# Patient Record
Sex: Female | Born: 1945 | Race: Black or African American | Hispanic: No | Marital: Married | Smoking: Never smoker
Health system: Southern US, Community
[De-identification: ages and names within clinical notes are randomized; demographics above are authoritative.]

## PROBLEM LIST (undated history)

## (undated) DIAGNOSIS — E0590 Thyrotoxicosis, unspecified without thyrotoxic crisis or storm: Secondary | ICD-10-CM

## (undated) DIAGNOSIS — R319 Hematuria, unspecified: Secondary | ICD-10-CM

## (undated) DIAGNOSIS — D649 Anemia, unspecified: Secondary | ICD-10-CM

## (undated) DIAGNOSIS — E785 Hyperlipidemia, unspecified: Secondary | ICD-10-CM

## (undated) DIAGNOSIS — D497 Neoplasm of unspecified behavior of endocrine glands and other parts of nervous system: Secondary | ICD-10-CM

## (undated) DIAGNOSIS — R51 Headache: Secondary | ICD-10-CM

## (undated) DIAGNOSIS — E079 Disorder of thyroid, unspecified: Secondary | ICD-10-CM

## (undated) DIAGNOSIS — I10 Essential (primary) hypertension: Secondary | ICD-10-CM

## (undated) DIAGNOSIS — Z8719 Personal history of other diseases of the digestive system: Secondary | ICD-10-CM

## (undated) DIAGNOSIS — Z9221 Personal history of antineoplastic chemotherapy: Secondary | ICD-10-CM

## (undated) DIAGNOSIS — R112 Nausea with vomiting, unspecified: Secondary | ICD-10-CM

## (undated) DIAGNOSIS — C50919 Malignant neoplasm of unspecified site of unspecified female breast: Secondary | ICD-10-CM

## (undated) DIAGNOSIS — K219 Gastro-esophageal reflux disease without esophagitis: Secondary | ICD-10-CM

## (undated) DIAGNOSIS — D219 Benign neoplasm of connective and other soft tissue, unspecified: Secondary | ICD-10-CM

## (undated) DIAGNOSIS — M1990 Unspecified osteoarthritis, unspecified site: Secondary | ICD-10-CM

## (undated) DIAGNOSIS — E669 Obesity, unspecified: Secondary | ICD-10-CM

## (undated) DIAGNOSIS — Z9889 Other specified postprocedural states: Secondary | ICD-10-CM

## (undated) DIAGNOSIS — R0602 Shortness of breath: Secondary | ICD-10-CM

## (undated) DIAGNOSIS — C801 Malignant (primary) neoplasm, unspecified: Secondary | ICD-10-CM

## (undated) DIAGNOSIS — Z923 Personal history of irradiation: Secondary | ICD-10-CM

## (undated) DIAGNOSIS — N189 Chronic kidney disease, unspecified: Secondary | ICD-10-CM

## (undated) HISTORY — DX: Malignant (primary) neoplasm, unspecified: C80.1

## (undated) HISTORY — DX: Disorder of thyroid, unspecified: E07.9

## (undated) HISTORY — PX: WRIST FRACTURE SURGERY: SHX121

## (undated) HISTORY — DX: Benign neoplasm of connective and other soft tissue, unspecified: D21.9

## (undated) HISTORY — DX: Essential (primary) hypertension: I10

## (undated) HISTORY — PX: OOPHORECTOMY: SHX86

## (undated) HISTORY — DX: Obesity, unspecified: E66.9

## (undated) HISTORY — DX: Hyperlipidemia, unspecified: E78.5

## (undated) HISTORY — PX: OTHER SURGICAL HISTORY: SHX169

## (undated) HISTORY — PX: CATARACT EXTRACTION: SUR2

## (undated) HISTORY — DX: Neoplasm of unspecified behavior of endocrine glands and other parts of nervous system: D49.7

## (undated) HISTORY — PX: ABDOMINAL HYSTERECTOMY: SHX81

## (undated) HISTORY — PX: CHOLECYSTECTOMY: SHX55

## (undated) HISTORY — PX: TUBAL LIGATION: SHX77

## (undated) HISTORY — PX: EYE SURGERY: SHX253

## (undated) HISTORY — PX: TOE SURGERY: SHX1073

---

## 1992-10-01 DIAGNOSIS — C801 Malignant (primary) neoplasm, unspecified: Secondary | ICD-10-CM

## 1992-10-01 HISTORY — DX: Malignant (primary) neoplasm, unspecified: C80.1

## 1992-10-01 HISTORY — PX: BREAST LUMPECTOMY: SHX2

## 1998-04-29 ENCOUNTER — Inpatient Hospital Stay (HOSPITAL_COMMUNITY): Admission: RE | Admit: 1998-04-29 | Discharge: 1998-05-03 | Payer: Self-pay | Admitting: Neurosurgery

## 1998-05-04 ENCOUNTER — Inpatient Hospital Stay (HOSPITAL_COMMUNITY): Admission: EM | Admit: 1998-05-04 | Discharge: 1998-05-10 | Payer: Self-pay | Admitting: Emergency Medicine

## 1998-06-07 ENCOUNTER — Encounter: Payer: Self-pay | Admitting: Neurosurgery

## 1998-06-07 ENCOUNTER — Ambulatory Visit (HOSPITAL_COMMUNITY): Admission: RE | Admit: 1998-06-07 | Discharge: 1998-06-07 | Payer: Self-pay | Admitting: Neurosurgery

## 1998-11-11 ENCOUNTER — Encounter: Payer: Self-pay | Admitting: Ophthalmology

## 1998-11-11 ENCOUNTER — Ambulatory Visit (HOSPITAL_COMMUNITY): Admission: RE | Admit: 1998-11-11 | Discharge: 1998-11-11 | Payer: Self-pay | Admitting: Ophthalmology

## 1999-06-06 ENCOUNTER — Encounter: Payer: Self-pay | Admitting: Neurosurgery

## 1999-06-06 ENCOUNTER — Ambulatory Visit (HOSPITAL_COMMUNITY): Admission: RE | Admit: 1999-06-06 | Discharge: 1999-06-06 | Payer: Self-pay | Admitting: Neurosurgery

## 1999-11-17 ENCOUNTER — Encounter: Admission: RE | Admit: 1999-11-17 | Discharge: 1999-11-17 | Payer: Self-pay | Admitting: Endocrinology

## 1999-11-17 ENCOUNTER — Encounter: Payer: Self-pay | Admitting: Endocrinology

## 2001-04-06 ENCOUNTER — Inpatient Hospital Stay (HOSPITAL_COMMUNITY): Admission: EM | Admit: 2001-04-06 | Discharge: 2001-04-07 | Payer: Self-pay | Admitting: Internal Medicine

## 2001-04-06 ENCOUNTER — Encounter: Payer: Self-pay | Admitting: Internal Medicine

## 2001-04-07 ENCOUNTER — Encounter: Payer: Self-pay | Admitting: Family Medicine

## 2001-04-11 ENCOUNTER — Encounter: Payer: Self-pay | Admitting: Neurology

## 2001-04-11 ENCOUNTER — Ambulatory Visit (HOSPITAL_COMMUNITY): Admission: RE | Admit: 2001-04-11 | Discharge: 2001-04-11 | Payer: Self-pay | Admitting: Neurology

## 2002-03-05 ENCOUNTER — Emergency Department (HOSPITAL_COMMUNITY): Admission: EM | Admit: 2002-03-05 | Discharge: 2002-03-05 | Payer: Self-pay | Admitting: *Deleted

## 2002-04-18 ENCOUNTER — Ambulatory Visit (HOSPITAL_COMMUNITY): Admission: RE | Admit: 2002-04-18 | Discharge: 2002-04-18 | Payer: Self-pay | Admitting: Endocrinology

## 2002-04-18 ENCOUNTER — Encounter: Payer: Self-pay | Admitting: Endocrinology

## 2002-06-13 ENCOUNTER — Encounter: Payer: Self-pay | Admitting: *Deleted

## 2002-06-13 ENCOUNTER — Emergency Department (HOSPITAL_COMMUNITY): Admission: EM | Admit: 2002-06-13 | Discharge: 2002-06-13 | Payer: Self-pay | Admitting: *Deleted

## 2002-10-19 ENCOUNTER — Ambulatory Visit (HOSPITAL_COMMUNITY): Admission: RE | Admit: 2002-10-19 | Discharge: 2002-10-19 | Payer: Self-pay | Admitting: Pulmonary Disease

## 2002-10-28 ENCOUNTER — Ambulatory Visit (HOSPITAL_COMMUNITY): Admission: RE | Admit: 2002-10-28 | Discharge: 2002-10-28 | Payer: Self-pay | Admitting: Pulmonary Disease

## 2003-01-22 ENCOUNTER — Other Ambulatory Visit: Admission: RE | Admit: 2003-01-22 | Discharge: 2003-01-22 | Payer: Self-pay | Admitting: Obstetrics and Gynecology

## 2003-05-14 ENCOUNTER — Ambulatory Visit (HOSPITAL_COMMUNITY): Admission: RE | Admit: 2003-05-14 | Discharge: 2003-05-14 | Payer: Self-pay | Admitting: Orthopaedic Surgery

## 2003-05-14 ENCOUNTER — Encounter: Payer: Self-pay | Admitting: Orthopaedic Surgery

## 2003-06-02 ENCOUNTER — Ambulatory Visit (HOSPITAL_COMMUNITY): Admission: RE | Admit: 2003-06-02 | Discharge: 2003-06-02 | Payer: Self-pay | Admitting: Pulmonary Disease

## 2003-06-10 ENCOUNTER — Ambulatory Visit (HOSPITAL_COMMUNITY): Admission: RE | Admit: 2003-06-10 | Discharge: 2003-06-10 | Payer: Self-pay | Admitting: Pulmonary Disease

## 2003-06-18 ENCOUNTER — Ambulatory Visit (HOSPITAL_COMMUNITY): Admission: RE | Admit: 2003-06-18 | Discharge: 2003-06-18 | Payer: Self-pay | Admitting: Pulmonary Disease

## 2003-08-08 ENCOUNTER — Emergency Department (HOSPITAL_COMMUNITY): Admission: EM | Admit: 2003-08-08 | Discharge: 2003-08-08 | Payer: Self-pay | Admitting: Emergency Medicine

## 2004-01-09 ENCOUNTER — Emergency Department (HOSPITAL_COMMUNITY): Admission: EM | Admit: 2004-01-09 | Discharge: 2004-01-09 | Payer: Self-pay | Admitting: Emergency Medicine

## 2004-01-24 ENCOUNTER — Other Ambulatory Visit: Admission: RE | Admit: 2004-01-24 | Discharge: 2004-01-24 | Payer: Self-pay | Admitting: Obstetrics and Gynecology

## 2004-02-24 ENCOUNTER — Ambulatory Visit (HOSPITAL_COMMUNITY): Admission: RE | Admit: 2004-02-24 | Discharge: 2004-02-24 | Payer: Self-pay | Admitting: Gastroenterology

## 2004-10-09 ENCOUNTER — Encounter: Admission: RE | Admit: 2004-10-09 | Discharge: 2004-10-09 | Payer: Self-pay | Admitting: Gastroenterology

## 2004-11-22 ENCOUNTER — Ambulatory Visit: Payer: Self-pay | Admitting: Orthopedic Surgery

## 2004-11-28 ENCOUNTER — Ambulatory Visit (HOSPITAL_COMMUNITY): Admission: RE | Admit: 2004-11-28 | Discharge: 2004-11-28 | Payer: Self-pay | Admitting: Orthopedic Surgery

## 2004-11-30 ENCOUNTER — Ambulatory Visit: Payer: Self-pay | Admitting: Orthopedic Surgery

## 2004-12-22 ENCOUNTER — Emergency Department (HOSPITAL_COMMUNITY): Admission: EM | Admit: 2004-12-22 | Discharge: 2004-12-22 | Payer: Self-pay | Admitting: Emergency Medicine

## 2005-03-01 ENCOUNTER — Other Ambulatory Visit: Admission: RE | Admit: 2005-03-01 | Discharge: 2005-03-01 | Payer: Self-pay | Admitting: Addiction Medicine

## 2005-03-06 ENCOUNTER — Ambulatory Visit: Payer: Self-pay | Admitting: Endocrinology

## 2005-04-24 ENCOUNTER — Ambulatory Visit (HOSPITAL_COMMUNITY): Admission: RE | Admit: 2005-04-24 | Discharge: 2005-04-24 | Payer: Self-pay | Admitting: Pulmonary Disease

## 2005-10-05 ENCOUNTER — Emergency Department (HOSPITAL_COMMUNITY): Admission: EM | Admit: 2005-10-05 | Discharge: 2005-10-05 | Payer: Self-pay | Admitting: Emergency Medicine

## 2005-10-29 ENCOUNTER — Ambulatory Visit: Payer: Self-pay | Admitting: Orthopedic Surgery

## 2006-04-26 ENCOUNTER — Ambulatory Visit (HOSPITAL_COMMUNITY): Admission: RE | Admit: 2006-04-26 | Discharge: 2006-04-26 | Payer: Self-pay | Admitting: Obstetrics and Gynecology

## 2006-05-03 ENCOUNTER — Other Ambulatory Visit: Admission: RE | Admit: 2006-05-03 | Discharge: 2006-05-03 | Payer: Self-pay | Admitting: Obstetrics and Gynecology

## 2006-05-10 ENCOUNTER — Emergency Department (HOSPITAL_COMMUNITY): Admission: EM | Admit: 2006-05-10 | Discharge: 2006-05-10 | Payer: Self-pay | Admitting: Emergency Medicine

## 2007-05-01 ENCOUNTER — Ambulatory Visit (HOSPITAL_COMMUNITY): Admission: RE | Admit: 2007-05-01 | Discharge: 2007-05-01 | Payer: Self-pay | Admitting: Obstetrics and Gynecology

## 2007-05-07 ENCOUNTER — Other Ambulatory Visit: Admission: RE | Admit: 2007-05-07 | Discharge: 2007-05-07 | Payer: Self-pay | Admitting: Obstetrics and Gynecology

## 2007-09-08 ENCOUNTER — Encounter: Payer: Self-pay | Admitting: Internal Medicine

## 2008-05-03 ENCOUNTER — Encounter: Admission: RE | Admit: 2008-05-03 | Discharge: 2008-05-03 | Payer: Self-pay | Admitting: Obstetrics and Gynecology

## 2009-05-04 ENCOUNTER — Encounter: Admission: RE | Admit: 2009-05-04 | Discharge: 2009-05-04 | Payer: Self-pay | Admitting: Obstetrics and Gynecology

## 2009-05-19 ENCOUNTER — Other Ambulatory Visit: Admission: RE | Admit: 2009-05-19 | Discharge: 2009-05-19 | Payer: Self-pay | Admitting: Obstetrics and Gynecology

## 2009-05-19 ENCOUNTER — Encounter: Payer: Self-pay | Admitting: Obstetrics and Gynecology

## 2009-05-19 ENCOUNTER — Ambulatory Visit: Payer: Self-pay | Admitting: Obstetrics and Gynecology

## 2009-05-20 ENCOUNTER — Ambulatory Visit: Payer: Self-pay | Admitting: Cardiology

## 2009-05-20 ENCOUNTER — Inpatient Hospital Stay (HOSPITAL_COMMUNITY): Admission: EM | Admit: 2009-05-20 | Discharge: 2009-05-24 | Payer: Self-pay | Admitting: Emergency Medicine

## 2009-05-23 ENCOUNTER — Encounter: Payer: Self-pay | Admitting: Cardiology

## 2009-05-23 ENCOUNTER — Encounter (INDEPENDENT_AMBULATORY_CARE_PROVIDER_SITE_OTHER): Payer: Self-pay | Admitting: Internal Medicine

## 2009-07-19 ENCOUNTER — Ambulatory Visit: Payer: Self-pay | Admitting: Obstetrics and Gynecology

## 2009-10-02 ENCOUNTER — Emergency Department (HOSPITAL_COMMUNITY): Admission: EM | Admit: 2009-10-02 | Discharge: 2009-10-02 | Payer: Self-pay | Admitting: Emergency Medicine

## 2010-03-25 ENCOUNTER — Emergency Department (HOSPITAL_COMMUNITY): Admission: EM | Admit: 2010-03-25 | Discharge: 2010-03-26 | Payer: Self-pay | Admitting: Emergency Medicine

## 2010-05-23 ENCOUNTER — Other Ambulatory Visit: Admission: RE | Admit: 2010-05-23 | Discharge: 2010-05-23 | Payer: Self-pay | Admitting: Obstetrics and Gynecology

## 2010-05-23 ENCOUNTER — Ambulatory Visit: Payer: Self-pay | Admitting: Obstetrics and Gynecology

## 2010-07-19 ENCOUNTER — Emergency Department (HOSPITAL_COMMUNITY): Admission: EM | Admit: 2010-07-19 | Discharge: 2010-07-19 | Payer: Self-pay | Admitting: Emergency Medicine

## 2010-08-11 ENCOUNTER — Emergency Department (HOSPITAL_COMMUNITY): Admission: EM | Admit: 2010-08-11 | Discharge: 2010-08-11 | Payer: Self-pay | Admitting: Emergency Medicine

## 2010-10-22 ENCOUNTER — Encounter: Payer: Self-pay | Admitting: Obstetrics and Gynecology

## 2010-12-12 LAB — CBC
HCT: 35.4 % — ABNORMAL LOW (ref 36.0–46.0)
Hemoglobin: 11.9 g/dL — ABNORMAL LOW (ref 12.0–15.0)
RBC: 3.95 MIL/uL (ref 3.87–5.11)
RDW: 13.7 % (ref 11.5–15.5)
WBC: 5.3 10*3/uL (ref 4.0–10.5)

## 2010-12-12 LAB — BASIC METABOLIC PANEL
BUN: 13 mg/dL (ref 6–23)
GFR calc Af Amer: >60 mL/min (ref >60)
GFR calc non Af Amer: 50 mL/min — ABNORMAL LOW (ref >60)
Potassium: 3.6 mEq/L (ref 3.5–5.1)
Sodium: 133 mEq/L — ABNORMAL LOW (ref 135–145)

## 2010-12-12 LAB — DIFFERENTIAL
Basophils Absolute: 0 10*3/uL (ref 0.0–0.1)
Lymphocytes Relative: 18 % (ref 12–46)
Monocytes Absolute: 0.4 10*3/uL (ref 0.1–1.0)
Neutro Abs: 3.9 10*3/uL (ref 1.7–7.7)

## 2010-12-12 LAB — GLUCOSE, CAPILLARY: Glucose-Capillary: 134 mg/dL — ABNORMAL HIGH (ref 70–99)

## 2010-12-13 LAB — COMPREHENSIVE METABOLIC PANEL
AST: 22 U/L (ref 0–37)
Albumin: 3.9 g/dL (ref 3.5–5.2)
Calcium: 9.1 mg/dL (ref 8.4–10.5)
Creatinine, Ser: 1.15 mg/dL (ref 0.4–1.2)
GFR calc Af Amer: 57 mL/min — ABNORMAL LOW (ref >60)

## 2010-12-13 LAB — URINALYSIS, ROUTINE W REFLEX MICROSCOPIC
Nitrite: NEGATIVE
Specific Gravity, Urine: 1.025 (ref 1.005–1.030)
pH: 5.5 (ref 5.0–8.0)

## 2010-12-13 LAB — CBC
MCH: 30.8 pg (ref 26.0–34.0)
MCHC: 34.2 g/dL (ref 30.0–36.0)
Platelets: 173 10*3/uL (ref 150–400)
RDW: 13.8 % (ref 11.5–15.5)

## 2010-12-13 LAB — DIFFERENTIAL
Eosinophils Relative: 1 % (ref 0–5)
Lymphocytes Relative: 30 % (ref 12–46)
Lymphs Abs: 1.1 10*3/uL (ref 0.7–4.0)
Monocytes Absolute: 0.5 10*3/uL (ref 0.1–1.0)

## 2010-12-13 LAB — URINE MICROSCOPIC-ADD ON

## 2010-12-13 LAB — LIPASE, BLOOD: Lipase: 28 U/L (ref 11–59)

## 2010-12-16 LAB — DIFFERENTIAL
Basophils Absolute: 0 10*3/uL (ref 0.0–0.1)
Basophils Relative: 0 % (ref 0–1)
Lymphocytes Relative: 19 % (ref 12–46)
Monocytes Relative: 12 % (ref 3–12)
Neutro Abs: 2.4 10*3/uL (ref 1.7–7.7)
Neutrophils Relative %: 68 % (ref 43–77)

## 2010-12-16 LAB — BASIC METABOLIC PANEL
Calcium: 8.5 mg/dL (ref 8.4–10.5)
Chloride: 102 mEq/L (ref 96–112)
Creatinine, Ser: 1.03 mg/dL (ref 0.4–1.2)
GFR calc non Af Amer: 54 mL/min — ABNORMAL LOW (ref >60)
Potassium: 4 mEq/L (ref 3.5–5.1)

## 2010-12-16 LAB — CBC
MCHC: 33.7 g/dL (ref 30.0–36.0)
RBC: 3.98 MIL/uL (ref 3.87–5.11)
RDW: 13.6 % (ref 11.5–15.5)

## 2010-12-17 LAB — CBC
HCT: 35 % — ABNORMAL LOW (ref 36.0–46.0)
Hemoglobin: 12 g/dL (ref 12.0–15.0)
MCH: 30.6 pg (ref 26.0–34.0)
MCV: 89.1 fL (ref 78.0–100.0)
RBC: 3.93 MIL/uL (ref 3.87–5.11)

## 2010-12-17 LAB — BASIC METABOLIC PANEL
CO2: 29 mEq/L (ref 19–32)
Calcium: 9.4 mg/dL (ref 8.4–10.5)
Chloride: 98 mEq/L (ref 96–112)
GFR calc Af Amer: >60 mL/min (ref >60)
Potassium: 3.9 mEq/L (ref 3.5–5.1)
Sodium: 135 mEq/L (ref 135–145)

## 2010-12-17 LAB — URINALYSIS, ROUTINE W REFLEX MICROSCOPIC
Leukocytes, UA: NEGATIVE
Nitrite: NEGATIVE
Protein, ur: NEGATIVE mg/dL
Specific Gravity, Urine: <1.005 — ABNORMAL LOW (ref 1.005–1.030)
Urobilinogen, UA: 0.2 mg/dL (ref 0.0–1.0)

## 2010-12-17 LAB — DIFFERENTIAL
Eosinophils Absolute: 0 10*3/uL (ref 0.0–0.7)
Lymphs Abs: 1.2 10*3/uL (ref 0.7–4.0)
Monocytes Relative: 14 % — ABNORMAL HIGH (ref 3–12)
Neutrophils Relative %: 56 % (ref 43–77)

## 2010-12-17 LAB — URINE MICROSCOPIC-ADD ON

## 2011-01-01 ENCOUNTER — Encounter: Payer: Self-pay | Admitting: Cardiovascular Disease

## 2011-01-06 LAB — COMPREHENSIVE METABOLIC PANEL
ALT: 28 U/L (ref 0–35)
AST: 38 U/L — ABNORMAL HIGH (ref 0–37)
Albumin: 3.1 g/dL — ABNORMAL LOW (ref 3.5–5.2)
BUN: 11 mg/dL (ref 6–23)
CO2: 23 mEq/L (ref 19–32)
CO2: 25 mEq/L (ref 19–32)
Calcium: 7.4 mg/dL — ABNORMAL LOW (ref 8.4–10.5)
Chloride: 103 mEq/L (ref 96–112)
Creatinine, Ser: 0.9 mg/dL (ref 0.4–1.2)
Creatinine, Ser: 1.19 mg/dL (ref 0.4–1.2)
GFR calc Af Amer: 55 mL/min — ABNORMAL LOW (ref >60)
GFR calc non Af Amer: 46 mL/min — ABNORMAL LOW (ref >60)
GFR calc non Af Amer: >60 mL/min (ref >60)
Glucose, Bld: 178 mg/dL — ABNORMAL HIGH (ref 70–99)
Sodium: 140 mEq/L (ref 135–145)
Total Bilirubin: 2 mg/dL — ABNORMAL HIGH (ref 0.3–1.2)
Total Protein: 5.9 g/dL — ABNORMAL LOW (ref 6.0–8.3)

## 2011-01-06 LAB — DIFFERENTIAL
Basophils Absolute: 0 10*3/uL (ref 0.0–0.1)
Basophils Absolute: 0 10*3/uL (ref 0.0–0.1)
Basophils Absolute: 0 10*3/uL (ref 0.0–0.1)
Basophils Absolute: 0 10*3/uL (ref 0.0–0.1)
Basophils Relative: 0 % (ref 0–1)
Basophils Relative: 0 % (ref 0–1)
Eosinophils Absolute: 0 10*3/uL (ref 0.0–0.7)
Eosinophils Absolute: 0 10*3/uL (ref 0.0–0.7)
Eosinophils Relative: 0 % (ref 0–5)
Lymphocytes Relative: 11 % — ABNORMAL LOW (ref 12–46)
Lymphocytes Relative: 17 % (ref 12–46)
Lymphocytes Relative: 7 % — ABNORMAL LOW (ref 12–46)
Lymphocytes Relative: 9 % — ABNORMAL LOW (ref 12–46)
Monocytes Absolute: 0.1 10*3/uL (ref 0.1–1.0)
Monocytes Absolute: 0.3 10*3/uL (ref 0.1–1.0)
Monocytes Absolute: 0.4 10*3/uL (ref 0.1–1.0)
Monocytes Relative: 5 % (ref 3–12)
Monocytes Relative: 7 % (ref 3–12)
Neutro Abs: 4.7 10*3/uL (ref 1.7–7.7)
Neutro Abs: 5.3 10*3/uL (ref 1.7–7.7)
Neutro Abs: 7.4 10*3/uL (ref 1.7–7.7)
Neutrophils Relative %: 77 % (ref 43–77)
Neutrophils Relative %: 83 % — ABNORMAL HIGH (ref 43–77)
Neutrophils Relative %: 86 % — ABNORMAL HIGH (ref 43–77)

## 2011-01-06 LAB — URINALYSIS, ROUTINE W REFLEX MICROSCOPIC
Nitrite: NEGATIVE
Specific Gravity, Urine: 1.01 (ref 1.005–1.030)
Urobilinogen, UA: 2 mg/dL — ABNORMAL HIGH (ref 0.0–1.0)

## 2011-01-06 LAB — GLUCOSE, CAPILLARY
Glucose-Capillary: 109 mg/dL — ABNORMAL HIGH (ref 70–99)
Glucose-Capillary: 145 mg/dL — ABNORMAL HIGH (ref 70–99)
Glucose-Capillary: 172 mg/dL — ABNORMAL HIGH (ref 70–99)
Glucose-Capillary: 178 mg/dL — ABNORMAL HIGH (ref 70–99)
Glucose-Capillary: 180 mg/dL — ABNORMAL HIGH (ref 70–99)
Glucose-Capillary: 180 mg/dL — ABNORMAL HIGH (ref 70–99)
Glucose-Capillary: 194 mg/dL — ABNORMAL HIGH (ref 70–99)
Glucose-Capillary: 201 mg/dL — ABNORMAL HIGH (ref 70–99)
Glucose-Capillary: 97 mg/dL (ref 70–99)

## 2011-01-06 LAB — IRON AND TIBC
Iron: 132 ug/dL (ref 42–135)
Saturation Ratios: 48 % (ref 20–55)
TIBC: 277 ug/dL (ref 250–470)
UIBC: 145 ug/dL

## 2011-01-06 LAB — CBC
HCT: 26.8 % — ABNORMAL LOW (ref 36.0–46.0)
HCT: 27.6 % — ABNORMAL LOW (ref 36.0–46.0)
Hemoglobin: 9 g/dL — ABNORMAL LOW (ref 12.0–15.0)
Hemoglobin: 9.1 g/dL — ABNORMAL LOW (ref 12.0–15.0)
Hemoglobin: 9.7 g/dL — ABNORMAL LOW (ref 12.0–15.0)
MCHC: 34.5 g/dL (ref 30.0–36.0)
MCHC: 34.8 g/dL (ref 30.0–36.0)
MCV: 90.3 fL (ref 78.0–100.0)
MCV: 90.6 fL (ref 78.0–100.0)
MCV: 90.7 fL (ref 78.0–100.0)
MCV: 90.7 fL (ref 78.0–100.0)
Platelets: 126 10*3/uL — ABNORMAL LOW (ref 150–400)
Platelets: 145 10*3/uL — ABNORMAL LOW (ref 150–400)
Platelets: 153 10*3/uL (ref 150–400)
Platelets: 171 10*3/uL (ref 150–400)
RBC: 2.68 MIL/uL — ABNORMAL LOW (ref 3.87–5.11)
RBC: 2.92 MIL/uL — ABNORMAL LOW (ref 3.87–5.11)
RDW: 13.2 % (ref 11.5–15.5)
RDW: 13.4 % (ref 11.5–15.5)
RDW: 13.4 % (ref 11.5–15.5)
WBC: 5.7 10*3/uL (ref 4.0–10.5)
WBC: 5.9 10*3/uL (ref 4.0–10.5)

## 2011-01-06 LAB — BASIC METABOLIC PANEL
BUN: 11 mg/dL (ref 6–23)
BUN: 20 mg/dL (ref 6–23)
Calcium: 7.8 mg/dL — ABNORMAL LOW (ref 8.4–10.5)
Calcium: 8.9 mg/dL (ref 8.4–10.5)
Chloride: 103 mEq/L (ref 96–112)
Creatinine, Ser: 1.61 mg/dL — ABNORMAL HIGH (ref 0.4–1.2)
GFR calc Af Amer: >60 mL/min (ref >60)
GFR calc non Af Amer: 32 mL/min — ABNORMAL LOW (ref >60)
GFR calc non Af Amer: 57 mL/min — ABNORMAL LOW (ref >60)
Glucose, Bld: 115 mg/dL — ABNORMAL HIGH (ref 70–99)
Glucose, Bld: 178 mg/dL — ABNORMAL HIGH (ref 70–99)
Potassium: 2.9 mEq/L — ABNORMAL LOW (ref 3.5–5.1)
Sodium: 138 mEq/L (ref 135–145)
Sodium: 138 mEq/L (ref 135–145)
Sodium: 140 mEq/L (ref 135–145)

## 2011-01-06 LAB — CARDIAC PANEL(CRET KIN+CKTOT+MB+TROPI)
CK, MB: 2 ng/mL (ref 0.3–4.0)
CK, MB: 2.5 ng/mL (ref 0.3–4.0)
CK, MB: 2.6 ng/mL (ref 0.3–4.0)
Relative Index: 1.1 (ref 0.0–2.5)
Relative Index: 1.2 (ref 0.0–2.5)
Total CK: 188 U/L — ABNORMAL HIGH (ref 7–177)
Total CK: 233 U/L — ABNORMAL HIGH (ref 7–177)
Troponin I: 0.08 ng/mL — ABNORMAL HIGH (ref 0.00–0.06)

## 2011-01-06 LAB — D-DIMER, QUANTITATIVE: D-Dimer, Quant: 1.72 ug/mL-FEU — ABNORMAL HIGH (ref 0.00–0.48)

## 2011-01-06 LAB — URINE MICROSCOPIC-ADD ON

## 2011-01-06 LAB — PROTIME-INR
INR: 1.4 (ref 0.00–1.49)
Prothrombin Time: 17.3 seconds — ABNORMAL HIGH (ref 11.6–15.2)

## 2011-01-06 LAB — CULTURE, BLOOD (ROUTINE X 2)
Culture: NO GROWTH
Report Status: 8252010
Report Status: 8252010

## 2011-01-06 LAB — BLOOD GAS, ARTERIAL
Bicarbonate: 22.3 mEq/L (ref 20.0–24.0)
O2 Content: 2 L/min
Patient temperature: 37
pH, Arterial: 7.372 (ref 7.350–7.400)
pO2, Arterial: 144 mmHg — ABNORMAL HIGH (ref 80.0–100.0)

## 2011-01-06 LAB — URINE CULTURE

## 2011-01-06 LAB — AMYLASE: Amylase: 72 U/L (ref 27–131)

## 2011-01-06 LAB — TYPE AND SCREEN
ABO/RH(D): B POS
Antibody Screen: NEGATIVE

## 2011-01-06 LAB — HEPATIC FUNCTION PANEL
ALT: 35 U/L (ref 0–35)
AST: 59 U/L — ABNORMAL HIGH (ref 0–37)
Total Protein: 7.6 g/dL (ref 6.0–8.3)

## 2011-01-06 LAB — LIPASE, BLOOD: Lipase: 22 U/L (ref 11–59)

## 2011-01-18 ENCOUNTER — Encounter: Payer: Self-pay | Admitting: *Deleted

## 2011-01-19 ENCOUNTER — Encounter: Payer: Self-pay | Admitting: *Deleted

## 2011-01-19 ENCOUNTER — Ambulatory Visit (INDEPENDENT_AMBULATORY_CARE_PROVIDER_SITE_OTHER): Payer: Medicare HMO | Admitting: Cardiovascular Disease

## 2011-01-19 ENCOUNTER — Encounter: Payer: Self-pay | Admitting: Cardiovascular Disease

## 2011-01-19 DIAGNOSIS — R011 Cardiac murmur, unspecified: Secondary | ICD-10-CM

## 2011-01-19 DIAGNOSIS — I10 Essential (primary) hypertension: Secondary | ICD-10-CM | POA: Insufficient documentation

## 2011-01-19 DIAGNOSIS — E785 Hyperlipidemia, unspecified: Secondary | ICD-10-CM | POA: Insufficient documentation

## 2011-01-19 DIAGNOSIS — R072 Precordial pain: Secondary | ICD-10-CM

## 2011-01-19 DIAGNOSIS — R079 Chest pain, unspecified: Secondary | ICD-10-CM | POA: Insufficient documentation

## 2011-01-19 DIAGNOSIS — R010 Benign and innocent cardiac murmurs: Secondary | ICD-10-CM

## 2011-01-19 NOTE — Patient Instructions (Signed)
Your physician has requested that you have en exercise stress cardiolite. For further information please visit [email or web address]. Please follow instruction sheet, as given. Your physician recommends that you continue on your current medications as directed. Please refer to the Current Medication list given to you today.  Follow up will be based on test results. If the results of your test are normal or stable, you will receive a letter. If they are abnormal, the nurse will contact you by phone.

## 2011-01-19 NOTE — Progress Notes (Signed)
HPI  Ms. Beauchaine is a 65 y/o female with no previous cardiac history. She has risk factors for CAD that include type 2 diabetes, hypertension and hyperlipidemia. She is referred for evaluation of recent chest pain. This started on 04/01 shortly after she woke up. It was in lower chest and upper epigastric area. It was mild and described as aching. It lasted all day and did not get worse with activities. There was no other associated symptoms. There was no frank heartburn. She had few milder episodes since then but not in last week. Dr. Margo Aye started her on Omeprazole with significant improvement in her symptoms. She did have similar chest pain in 2009. A stress test was unremarkable at that time. She is reasonably active and exercises at low-moderate level on a regular basis. She denies any dyspnea, palpitations, syncope or presyncope.   Allergies  Allergen Reactions  . Ciprofloxacin Hcl   . Codeine Nausea And Vomiting     Current Outpatient Prescriptions on File Prior to Visit  Medication Sig Dispense Refill  . bimatoprost (LUMIGAN) 0.03 % ophthalmic drops Place 1 drop into both eyes at bedtime.        . brimonidine (ALPHAGAN P) 0.1 % SOLN Place 1 drop into both eyes 2 (two) times daily.       . dorzolamide-timolol (COSOPT) 22.3-6.8 MG/ML ophthalmic solution Place 1 drop into both eyes 2 (two) times daily.        . hydrocortisone (CORTEF) 20 MG tablet Take 20 mg by mouth daily. And 1/2 by  Mouth in eveing      . levothyroxine (SYNTHROID, LEVOTHROID) 112 MCG tablet Take 112 mcg by mouth daily.        . metFORMIN (GLUMETZA) 1000 MG (MOD) 24 hr tablet Take 1,000 mg by mouth daily with breakfast.        . pioglitazone (ACTOS) 45 MG tablet Take 45 mg by mouth daily.        Marland Kitchen DISCONTD: irbesartan (AVAPRO) 150 MG tablet Take 150 mg by mouth at bedtime.        Marland Kitchen DISCONTD: triamterene-hydrochlorothiazide (MAXZIDE-25) 37.5-25 MG per tablet Take 1 tablet by mouth daily.           Past Medical History    Diagnosis Date  . Diabetes mellitus   . Glaucoma   . Obesity   . Cancer 1994    left breast  . Thyroid disease     overactive thyroid  . Hypertension   . Hyperlipidemia      Past Surgical History  Procedure Date  . Breast lumpectomy     with removal of lymph nodes/had chemo and radiation  . 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 Cholecystectomy   . Abdominal hysterectomy   . Tubal ligation      Family History  Problem Relation Age of Onset  . Coronary artery disease Other     family h/o  . Hypertension Other     family h/o  . Diabetes Other     family h/o  . Breast cancer Mother   . Heart failure Father      History   Social History  . Marital Status: Married    Spouse Name: N/A    Number of Children: N/A  . Years of Education: N/A   Occupational History  . Not on file.   Social History Main Topics  . Smoking status: Never Smoker   . Smokeless tobacco: Not on file  . Alcohol Use: No  .  Drug Use: No  . Sexually Active: Not on file   Other Topics Concern  . Not on file   Social History Narrative   Married to [place] one son Dayton Martes to work in Designer, fashion/clothing     ROS Constitutional: Negative for fever, chills, diaphoresis, activity change, appetite change and fatigue.  HENT: Negative for hearing loss, nosebleeds, congestion, sore throat, facial swelling, drooling, trouble swallowing, neck pain, voice change, sinus pressure and tinnitus.  Eyes: Negative for photophobia, pain, discharge and visual disturbance.  Respiratory: Negative for apnea, cough, chest tightness, shortness of breath and wheezing.  Cardiovascular: Negative for palpitations and leg swelling.  Gastrointestinal: Negative for nausea, vomiting, abdominal pain, diarrhea, constipation, blood in stool and abdominal distention.  Genitourinary: Negative for dysuria, urgency, frequency, hematuria and decreased urine volume.  Musculoskeletal: Negative for myalgias, back pain, joint swelling,  arthralgias and gait problem.  Skin: Negative for color change, pallor, rash and wound.  Neurological: Negative for dizziness, tremors, seizures, syncope, speech difficulty, weakness, light-headedness, numbness and headaches.  Psychiatric/Behavioral: Negative for suicidal ideas, hallucinations, behavioral problems and agitation. The patient is not nervous/anxious.     PHYSICAL EXAM   BP 133/83  Pulse 75  Ht 5\' 4"  (1.626 m)  Wt 188 lb (85.276 kg)  BMI 32.27 kg/m2 Constitutional: She is oriented to person, place, and time. She appears well-developed and well-nourished. No distress.  HENT: No nasal discharge.  Head: Normocephalic and atraumatic.  Eyes: Pupils are equal, round, and reactive to light. Right eye exhibits no discharge. Left eye exhibits no discharge.  Neck: Normal range of motion. Neck supple. No JVD present. No thyromegaly present.  Cardiovascular: Normal rate, regular rhythm, normal heart sounds and intact distal pulses. Exam reveals no gallop and no friction rub.  There is a 1/6 systolic ejection murmur heard in aortic area with preserved S2.  Pulmonary/Chest: Effort normal and breath sounds normal. No stridor. No respiratory distress. She has no wheezes. She has no rales. She exhibits no tenderness.  Abdominal: Soft. Bowel sounds are normal. She exhibits no distension. There is no tenderness. There is no rebound and no guarding.  Musculoskeletal: Normal range of motion. She exhibits no edema and no tenderness.  Neurological: She is alert and oriented to person, place, and time. Coordination normal.  Skin: Skin is warm and dry. No rash noted. She is not diaphoretic. No erythema. No pallor.  Psychiatric: She has a normal mood and affect. Her behavior is normal. Judgment and thought content normal.     EKG: Normal sinus rhythm. 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

## 2011-01-19 NOTE — Assessment & Plan Note (Signed)
Her chest pain is overall atypical and could have been GI related. However, her ECG is slightly abnormal. An isolated Q wave in lead III is not pathologic by itself. However, there is a small Q wave in aAVF as well. She also has multiple risk factors for CAD that include diabetes, hypertension, hyperlipidemia and age. Thus, I recommend further evaluation with a treadmill nuclear stress test. If this comes back normal, then continue aggressive treatment of her risk factors.

## 2011-01-19 NOTE — Assessment & Plan Note (Signed)
BP is well controlled 

## 2011-01-19 NOTE — Assessment & Plan Note (Signed)
Being treated with Simvastatin. Goal LDL <100 due to diabetes.

## 2011-01-19 NOTE — Assessment & Plan Note (Signed)
This seems to be a benign flow murmur.

## 2011-01-23 DIAGNOSIS — R072 Precordial pain: Secondary | ICD-10-CM

## 2011-02-13 NOTE — H&P (Signed)
NAME:  Alexis Singh, Alexis Singh NO.:  [id number]   MEDICAL RECORD NO.:  [id number]          PATIENT TYPE:  INP   LOCATION:  IC03                          FACILITY:  [facility]   PHYSICIAN:  Melissa L. Ladona Ridgel, MD  DATE OF BIRTH:  04-21-1946   DATE OF ADMISSION:  05/20/2009  DATE OF DISCHARGE:  LH                              HISTORY & PHYSICAL   CHIEF COMPLAINT:  I was so cold and I had increased urination and  vomiting.   PRIMARY CARE PHYSICIAN:  Alfonse Alpers. Dagoberto Ligas, M.D.   HISTORY OF PRESENT ILLNESS:  The patient is a 65 year old African  American female who had the acute onset of chills on the evening prior  to admission.  Up until that time she was in her usual state of health.  She states that later in the evening she developed nausea and vomiting.  She had no diarrhea.  She stated that she continued to have nausea and  vomiting persisting into the morning.  She came to the emergency room  for further evaluation and was noted to be quite ill-appearing.  She had  a CT scan of the abdomen which showed liver cysts that have increased in  size and quantity and appear very complex suggesting either hemorrhagic  transformation or other pathology.  She definitely had evidence for UTI  and was started on Rocephin 1 mg.  The patient subsequently became  hypotensive consistent with impending vascular collapse secondary to  sepsis and likely adrenal insufficiency.  The patient is hydrocortisone  dependent secondary to a history of pituitary tumor removed in 1995 and  1999.   The patient was immediately treated upon my arrival with hydrocortisone  100 mg IV and volume repletion was continued.  A central line was placed  by the emergency room physician and we continued to volume replete this  patient and transfer her to the ICU for admission.  Further antibiotic  coverage was added in the emergency room for sepsis and a sepsis  protocol was initiated.   REVIEW OF SYSTEMS:   CONSTITUTIONAL:  The patient describes no weight  loss or weight gain.  She has had no fever but did have chills.  HEENT:  She does have some changes in her vision for which she takes eye drops.  Ears, nose and throat - no tinnitus, dysphagia, discharge.  CARDIOVASCULAR:  No pain or palpitations.  RESPIRATORY:  No shortness of  breath or cough.  GI:  No diarrhea but positive nausea and vomiting.  GU:  Positive frequency, no dysuria.  NEUROLOGICAL:  No headaches or  seizure disorder.  MUSCULOSKELETAL:  She describes no pain or deformity  although she does have chronic edema in the lower extremities and a  little bit in her left arm.  HEMATOLOGIC:  No new rashes.  PSYCHIATRIC:  No history of depression or anxiety.   ALLERGIES:  Are to CODEINE otherwise she is not allergic to any other  antibiotics.   ORAL MEDICATIONS:  Were unavailable at the time of evaluation but she  does take chronic hydrocortisone, a medication for her diabetes and a  medication  for blood pressure.   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   PAST SURGICAL HISTORY:  Lumpectomy on the left breast, cholecystectomy,  hysterectomy and tubal ligation.   SOCIAL HISTORY:  Does not partake in tobacco.  She does not drink.  Her  contact person is her husband, Maisie Fus, [phone] or cell phone [phone].  She has one son, Francis Dowse, [phone].  the patient used to work in Designer, fashion/clothing.   FAMILY HISTORY:  Mom is deceased with a history of breast cancer.  Dad  is deceased with a history of congestive heart failure.   PHYSICAL EXAM:  VITAL SIGNS:  Temperature was 103.6 down to 98.4, blood  pressure upon my arrival was 85/40, pulse 76, respirations 18,  saturation 96%.  GENERAL:  This is an obviously ill African American female with mildly  altered mental status.  She can come to a full wakefulness and give me   her history but then falls back to sleep easily.  HEENT:  She is normocephalic with thinning hair.  Her tympanic membranes  are clear without obvious infection.  She has no external ear lesions.  Pupils are equal, round, reactive to light.  Extraocular muscles are  intact.  She has anicteric sclerae although there is some muddying in  the corners which may suggest early icterus.  Septum is midline without  discharge.  Examination of the mouth reveals no oral lesions or lip  lesions.  NECK:  Neck is supple.  There is no JVD, no lymphs.  There are no  carotid bruits.  Trachea is in the midline.  CHEST:  Decreased but clear to auscultation.  There are no rhonchi,  rales or wheezes.  CARDIOVASCULAR:  Regular rate and rhythm.  Positive S1, S2.  No S3, S4.  Heart sounds are distant.  She has no heaves or thrills.  I do not  appreciate a murmur, rub or gallop.  ABDOMEN:  Soft, obese, nontender, nontender with positive bowel sounds.  There is no hepatosplenomegaly.  No guarding, no rebound.  EXTREMITIES:  Show edema in the lower extremities that is doughy but not  pitting.  She has 2+ radial pulses, 2+ dorsalis pedis pulses.  NEUROLOGICAL:  She is marginally wakeful.  Cranial nerves II-XII  otherwise appear to be intact.  Power is 4/5 symmetrically in the upper  and lower extremities and sensation is intact grossly.   LABORATORY:  Her urinalysis shows large leukocyte esterase, too numerous  to count WBCs, white count is 9.5, hemoglobin 11.8, hematocrit 33.4,  platelets of 171.  Sodium is 138, potassium 3.2, chloride 101, CO2 29,  BUN 20, creatinine 1.61, glucose is 115.  LFTs are not normal limits  with AST which is elevated at 59, total protein is 7.6, albumin 3.8, ALT  is 35, alk phos 145, total bilirubin is abnormal and it is 4.0, direct  bilirubin is also elevated at 0.4.  CT scan of the abdomen shows no  obvious colonic problems but she does have liver cysts which were  complex in  nature.   ASSESSMENT AND PLAN:  This is a 65 year old African American female with  presentation for nausea and vomiting, diagnosed with urinary tract  infection and is likely septic plus or minus suffering vascular collapse  secondary to her adrenal insufficiency.  She was given hydrocortisone  stat upon my arrival to the emergency room and is  being volume  resuscitated at this time.  We have asked the emergency room for a  central line placement.  1. Sepsis with adrenal insufficiency.  Will continue stress dose      hydrocortisone.  Central line monitoring will be initiated with      CVPs.  I will start her on vancomycin, Cipro and renally adjusted      Zosyn as well as obtain blood cultures and urine cultures.  2. Urinary tract infection.  Will continue antibiotics as discussed      and narrow it as we get an organism.  A Foley will be placed to      gravity to monitor her urine output.  3. Hypokalemia.  Will replete that IV.  4. Hyperbilirubinemia.  An ultrasound of the right upper quadrant will      be obtained to assess ductal dilatation.  CT scan shows complex      cysts.  This cannot be completely characterized.  In the face of      having breast cancer I will rule out possible metastatic disease.  5. Hypertension.  Will hold her current medications as she is      hypotensive.  6. Diabetes, blood sugars are low.  I am going to place her on D5      normal saline at a low dose and obtain her outpatient medications      but hold those at this time.  Will just use sliding scale as      necessary.  7. Breast cancer free since 1994.  8. Renal insufficiency.  Will place a Foley.  Continue to hydrate her      and repeat her labs in the morning.  If her renal function is worse      will obtain ultrasound and consider nephrology consult.  9. DVT prophylaxis for now will be heparin subcu every 8 hours.   Total time on this case was approximately 2-1/2 hours.      Melissa L. Ladona Ridgel,  MD  Electronically Signed     MLT/MEDQ  D:  05/21/2009  T:  05/21/2009  Job:  [id number]   cc:   Alfonse Alpers. Dagoberto Ligas, M.D.  Fax: [phone]

## 2011-02-13 NOTE — Group Therapy Note (Signed)
NAME:  Alexis Singh, REAS NO.:  [id number]   MEDICAL RECORD NO.:  [id number]          PATIENT TYPE:  INP   LOCATION:  IC03                          FACILITY:  [facility]   PHYSICIAN:  Melissa L. Ladona Ridgel, MD  DATE OF BIRTH:  Aug 27, 1946   DATE OF PROCEDURE:  05/22/2009  DATE OF DISCHARGE:                                 PROGRESS NOTE   Subjectively the patient is doing much better today.  She is tolerating  a full diet.  She is less somnolent.  She is sitting up in the chair.  She denies pain except for a little bit of indigestion in her stomach.  She denies nausea and vomiting.   VITAL SIGNS:  Temperature is 98.2, blood pressure 130/68 to 142/72,  pulse of 54 to 74, respirations 15 to 19, saturations 100%.  Intake and  output 4,030 in and 3,550 out.  No stools.  Admitting weight was 91 kg;  her current weight is 93.9 kg.   GENERAL:  This is a well-developed, well-nourished African American  female in no acute distress.  She does have some puffiness of her face  which is secondary to her steroids.  This is unchanged.  Her lips also  remain puffy, but this is unchanged.  Pupils equal, round, reactive to light.  Extraocular muscles intact.  She has anicteric sclerae.  NOSE:  Septum midline without discharge.  MOUTH:  Examination of the mouth reveals no oral lesions or lip lesions.  NECK:  Supple.  There is no JVD, no lymph nodes, and no carotid bruits.  Trachea is in the midline.  She has a right subclavian line which is  clean, dry, and intact.  CHEST:  Decreased but clear to auscultation.  I do not appreciate any  rhonchi, rales, wheezes.  CARDIOVASCULAR:  Regular rate and rhythm.  Positive S1, s2.  No S3, S4.  No murmurs, rubs or gallops.  ABDOMEN:  Obese, nontender, nondistended with positive bowel sounds.  There is no hepatosplenomegaly.  No guarding or rebound.  EXTREMITIES: Lower extremitiy edema that is nonpitting in the lower  extremities and ankle.  There is a  little tenderness in the right shin.  She complains of some pain which is associated mainly with that area,  but otherwise her calf is improved.  NEUROLOGICAL:  Cranial nerves II-XII intact.  She is back to baseline  cognitively.  Power is 5/5 symmetrically in the upper extremities and  lower extremities.  She dorsiflexes evenly.  Sensation is intact.  PSYCHIATRIC:  Affect is appropriate.  Recent memory intact.  Judgment,  insight are intact.   PERTINENT LABORATORIES AND TESTING:  Her ultrasound was completed  yesterday which showed changes consistent with polycystic liver disease.  There are no abnormalities related to these cysts and no abscesses are  documented.  There is no evidence for obstructive uropathy.  There do  appear to be several small cysts in her left kidney.  There appear to be  echogenic kidneys bilaterally suggesting medical renal disease.   Bilateral lower extremity studies reveal no evidence for DVT or  thrombosis.   Urine culture  is growing 20,000 colonies of gram-negative rods which  have yet to be speciated.  Blood cultures were negative.  CBC has a  white count of 6.1, hemoglobin 9, hematocrit 25, platelet count 129.  Hemoglobin A1c is 5.8.  Her cardiac markers reveal slightly elevated  troponin but no elevation in her MB.  Peak troponin level was 0.11.   ASSESSMENT/PLAN:  This is a 65 year old African American female admitted  to the ICU with apparent sepsis and vascular collapse likely secondary  to adrenal insufficiency.  Source for infection appears to be a urinary  tract infection.  Of note the patient is status post removal of  pituitary in the 90s and is hydrocortisone dependent.  Therefore the  majority of her reaction to this illness was likely related to her  adrenal insufficiency.  The patient is doing much better today.  I would  like to start to wean her back to her usual dose of hydrocortisone and  discontinue some of the central lines and IV  lines that she has to  prevent her from getting a secondary infection.  1. Urinary tract infection, 20,000 gram negative rods being speciated.      I will go ahead and discontinue her vancomycin. I will keep her on      Cipro and Zosyn until I see what the organisms are and then pare      her back even further if the Cipro is appropriate.  Blood cultures      have also been negative.  2. Adrenal insufficiency, on hydrocortisone 100 IV q.8.  I will start      weaning her back to home dose, down to 50 mg IV q.8 for tonight.      Will discontinue her central line to prevent her from having these      get infected as long as we are able to get a peripheral access.  3. Sepsis, recovering well.  Likely the majority of her response was      secondary to adrenal insufficiency.  I am going to decrease her IV      fluids, transition her to a general medical bed, discontinue her      Foley, and continue to treat her infection.  4. Glaucoma.  We will continue her eye drops.  5. Hypothyroidism.  Continue her Synthroid.  Repeat her thyroid      function tests prior to discharge so that I can send those to Dr.      Dagoberto Ligas.  6. Indigestion.  We will increase the Protonix to q.12 and add Maalox.  7. History of hypertension.  She is currently off of her home      medications because of the hypotension.  Will reevaluate her in the      morning to resume Norvasc.  Please note she did have a slight      elevation in her troponin during the course of the hospital stay.      Will go ahead and check an echo and we may review the case with      cardiology in the morning.  At this time I feel it is related to      the stress of her illness and likely not significant.  8. Hyperlipidemia.  We will resume Zocor 40 mg.  9. Anemia.  Hemoglobin is 9.  We will heme-check her stools, do an      iron study, and if she drops further we will replete her.  Total time on this case was 40 minutes.      Melissa L.  Ladona Ridgel, MD  Electronically Signed     MLT/MEDQ  D:  05/22/2009  T:  05/22/2009  Job:  [id number]

## 2011-02-13 NOTE — Discharge Summary (Signed)
NAME:  Alexis Singh, Alexis Singh NO.:  [id number]   MEDICAL RECORD NO.:  [id number]          PATIENT TYPE:  INP   LOCATION:  A209                          FACILITY:  [facility]   PHYSICIAN:  Melissa L. Ladona Ridgel, MD  DATE OF BIRTH:  1946/01/03   DATE OF ADMISSION:  05/20/2009  DATE OF DISCHARGE:  08/24/2010LH                               DISCHARGE SUMMARY   DISCHARGE DIAGNOSES:  1. Adrenal insufficiency secondary to urinary tract infection with      likely early sepsis.  Please note that the patient's hemodynamic      instability likely was more secondary to adrenal insufficiency      related to her infection, however, she was treated with sepsis      protocol and was given immediate stress dose steroids.  She turned      the corner towards recovery very quickly after the aggressive      rehydration and use of IV hydrocortisone, as well as antibiotic      therapy.  She has been weaned down from hydrocortisone 100 IV q.8      and was discharged to home at the request of Dr. Dagoberto Ligas on 30 mg of      hydrocortisone twice daily x2 more days and then down to her home      dosage of 20 mg in morning and 10 mg at night.  Please note that I      have instructed the patient and her family regarding how to handle      increased stressors, as Dr. Dagoberto Ligas has in the past.  I expressed      her that if she becomes ill with a cold or any sort of infection,      she needs to immediately increase her hydrocortisone and call Dr.      Jerelene Redden office for further guidance.  I will allow him to      reinforce at the next evaluation.  2. Hypertension.  The patient has been off of her hydrochlorothiazide      and Norvasc because of the near vascular collapse with blood      pressures in the low 80s.  I will resume her Norvasc 10 mg daily      and hold her triamterene/hydrochlorothiazide.  Dr. Dagoberto Ligas can re-      evaluate for resumption of this, as at this time she continues to      have difficulty with  low potassium and magnesium.  3. Hypokalemia.  I will replete the patient with IV potassium prior to      discharge and discharge her to home on 40 mEq p.o. x4 days, in      addition to magnesium 400 mg twice daily and then she can resume      her home dose of 10 mEq daily.  4. Hypomagnesemia.  The patient will replete it p.o. with about 4 days      of 400 daily and then will discontinue that.  She can have re-      evaluation of her blood work with Dr. Dagoberto Ligas at the time  of her      visit.  5. Slight hyperbilirubinemia with a history of cystic lesions on the      liver.  The patient underwent a right upper quadrant ultrasound      which further classified these lesions as cystic and likely      unchanged from previous evaluations.  Dr. Dagoberto Ligas can continue to      monitor them in the outpatient setting.  The elevation in bilirubin      likely represented a stress response, as it is improving and down      to 2.  That can be followed up as an outpatient.  Please note that      the patient is status post cholecystectomy and her common bile duct      was within normal limits without any evidence for ductal      debilitation.  She does have changes consistent with polycystic      liver disease.  6. Mild elevations in her troponins without evidence for chest pain.      The patient underwent a 2-D echocardiogram to evaluate for akinesia      and serial cardiac markers were obtained which showed no obvious      myocardial infarction.  Her 2-D echocardiogram revealed left      ventricular size within normal limits with normal systolic      function, ejection fraction of 60% to 65% and normal wall motion      without any significant abnormality.  Please note that her peak PA      pressures were 32 mmHg.  7. Hypertension.  The patient's medications have been on hold.  I will      ask her to resume her Norvasc only and hold the hydrochlorothiazide      because of the electrolyte imbalances.  8.  Urinary tract infection which was the likely source for her      response to infection.  She grew out Citrobacter which is sensitive      to Cipro.  She will be released with 10 more days of antibiotic      therapy.  9. Diabetes.  The patient will resume her Actos and will be followed      by Dr. Dagoberto Ligas in the outpatient office.  Please note that her      current glucoses have been slightly elevated in the 150s, but as      she returns towards her normal doses of steroids, that should be      improved, especially if she takes her Actos.  10.Renal insufficiency.  The patient was rehydrated aggressively and a      Foley catheter was initially placed.  Currently her creatinine is      down to 0.99.  Please note that the ultrasound did detect some      medical renal disease.  11.Abnormal thyroid studies, which are consistent with a central      disease process, consistent with her history of pituitary      resection.  She will continue with her home dose of Synthroid and      will be followed by Dr. Dagoberto Ligas as an outpatient.  12.Anemia.  The patient has had no obvious bloody stools.  Her anemia      panel really showed no iron deficiency.  I would request that she      have a repeat CBC with hemoglobin and hematocrit at her followup  visit with Dr. Dagoberto Ligas.  13.Hyperlipidemia.  The patient will continue with her Zocor.  14.Glaucoma.  She will continue with her eye drops.   CONSULTATIONS DURING THIS STAY:  None.   STUDIES:  As stated, an ultrasound of the right upper quadrant,  confirming polycystic liver disease, a 2-D echo as stated above, x-ray  as stated, which showed no obvious infection and no pleural effusion.  The patient also has venous Dopplers which showed no obvious DVT.   HOSPITAL COURSE:  The patient is a very pleasant 65 year old African  American female who developed the acute onset of chills one night prior  to arrival in the emergency room.  She then subsequently  developed  nausea and vomiting which persisted throughout the night into the  morning.  She came to the emergency room for further care.  She was seen  and evaluated and slightly hydrated and studies at that time were  showing possibility for urinary tract infection.  She therefore was  started on antibiotic therapy.  Subsequently she started to develop  decreased blood pressures and altered mental status progressed,  consistent with obvious adrenal insufficiency.  Upon my arrival to the  emergency room, I dosed her with stress dose steroid with hydrocortisone  100 IV and the patient was admitted to the ICU for closer monitoring.  I  continued stress dose steroids of 100 IV q.8 hours for at least 6 doses.  She subsequently turned the corner quite well and by 24 to 36 hours into  her visit was awake, alert, without any evidence for altered mental  status.  Blood pressures returned to reasonable levels.  I was able to  discontinue her central line and transition her to IV maintenance  therapy.  She was able to advance her diet and tolerated that well.   Because during the course of this visit the patient developed slightly  elevated troponins, I did go ahead and check a 2-D echo, which showed no  obvious evidence for recent infarction.  She continued to progress and  was moved to the general medical floor and her hydrocortisone was  decreased back towards her normal dosage.  She showed no signs or  symptoms of regression with regard to adrenal insufficiency.  Throughout  her hospital course, her magnesium and potassium were low.  They were  repleted IV and p.o.  She will be discharged to home with additional  doses of magnesium and potassium, to follow up with Dr. Dagoberto Ligas later in  the week.  The patient's course was notable for slight anemia.  Last  hemoglobin was 9.7.  Her anemia panel shows no obvious iron deficiency.  I will therefore allow with Dr. Dagoberto Ligas to monitor her.  This may be   related to chronic illness.  We did not have any heme-positive stools  during this admission. On the day of discharge the patient is feeling  quite well, no nausea, no vomiting, no diarrhea, no chest pain or  abdominal pain are noted.  She is subsequently eating a full diet.   PHYSICAL EXAMINATION:  VITAL SIGNS:  Temperature is 96.2, blood pressure  126/66, pulse 55, respirations 20.  Her highest blood pressure was  156/87.  Saturations are 98%.  GENERAL:  This is a very pleasant, moderately obese African American  female in no acute distress.  HEENT:  She is normocephalic, atraumatic.  Pupils are equal, round, and  reactive to light.  Extraocular muscles are intact.  She has mild  puffiness  to her face, but this is much improved since admission.  Mucous membranes are moist.  There are no oral lesions or lip lesions.  NECK:  Supple.  There is no JVD, no lymph nodes, no carotid bruits.  CHEST:  Decreased, but clear to auscultation.  There are no rhonchi,  rales, or wheezes.  CARDIOVASCULAR:  Regular rate and rhythm, positive S1 and S2.  No S3 or  S4.  No murmurs, rubs, or gallops.  ABDOMEN:  Obese, nontender, nondistended with positive bowel sounds.  No  hepatosplenomegaly, no guarding, no rebound.  EXTREMITIES:  Showed doughy nonpitting edema.  She does have a little  bit of tenderness on the right shin, which was previously explained, but  no lesions are there.  We did Doppler her legs and she has no obvious  blood clots.   At the time of discharge, she pertinent laboratories were the following:  Blood cultures have been negative x4 days.  Her magnesium prior to  repletion was 1.6.  Sodium 140, potassium 2.9 prior to repletion,  chloride 103, CO2 of 30, BUN was 11, creatinine 0.9, glucose 178,  calcium 8.4.  White count was 5.9, hemoglobin 9.7, hematocrit 27.6, and  platelets of 153.  Urine cultures growing Citrobacter freundii.  Cardiac  panels were all within normal limits with the  exception of a slight  increase in her troponin; the highest was 0.11.  Hemoglobin A1c was 5.8.  TSH during the stressor was 0.032 and her thyroxine level which was a  total T4 during the stressor was 4.3.  The patient's lipase was within  normal limits at 22 and she had no evidence for lactic acidosis.  In  fact it was low at 0.3.  Her D-dimer was 1.72, but likely represents the  stressors placed upon her.  We did not obtain a CT of the chest, as we  had an obvious source of her infection and hemodynamic compromise.   At this time the patient is deemed stable for discharge to home, to  follow up with Dr. Dagoberto Ligas in the next 7 days.  She has been instructed  on taking extended doses of hydrocortisone for 2 more days and then  decreasing to her regular home doses.  She will take a full 14 days of  antibiotics for her urinary tract infection and will call her primary  care physician if she develops any worsening symptoms of nausea,  vomiting, diarrhea, or impending compromise.   Total time on this discharge was 45 minutes.  I have reviewed the case  with Dr. Dagoberto Ligas and the patient.  We will replete her potassium and  magnesium before she discharges.      Melissa L. Ladona Ridgel, MD  Electronically Signed     MLT/MEDQ  D:  05/24/2009  T:  05/24/2009  Job:  [id number]

## 2011-02-13 NOTE — Group Therapy Note (Signed)
NAME:  Alexis Singh, Alexis Singh NO.:  [id number]   MEDICAL RECORD NO.:  [id number]          PATIENT TYPE:  INP   LOCATION:  A209                          FACILITY:  [facility]   PHYSICIAN:  Melissa L. Ladona Ridgel, MD  DATE OF BIRTH:  02/02/1946   DATE OF PROCEDURE:  05/23/2009  DATE OF DISCHARGE:                                 PROGRESS NOTE   Subjectively, the patient is doing quite well.  She is tolerating her  diet.  She denies any pain or shortness of breath.  She is not dizzy nor  did she have any nausea or vomiting today.   VITAL SIGNS:  Temperature is 98.1, blood pressure 132/74, pulse 73,  respirations 18, saturations 100%.  Current weight is 94.1 kg.  She has  no reported stools.  In the computer, it states that she did have 2  small soft stools today.  GENERAL:  This is a well-developed, well-nourished African American  female in no acute distress.  She does have some puffiness to her face  but is actually improving with the decrease in her steroids.  Her lips  are not as puffy as they have been.  HEENT:  Pupils are equal, round, reactive to light.  Extraocular muscles  are intact.  She has anicteric sclerae.  Nose reveals septum midline  without discharge.  Physical examination of the mouth reveals no oral  lesions or lip lesions.  NECK:  Supple.  There is no JVD.  No lymph nodes.  No carotid bruits.  Trachea is in the midline.  She has a right subclavian line which was  removed.  The dressing is completely dry and intact.  CHEST:  Decreased but clear to auscultation.  I do not appreciate any  rhonchi, rales or wheezes.  CARDIOVASCULAR:  Regular rate and rhythm.  Positive S1, S2.  No S3, S4,  murmurs, rubs or gallops.  ABDOMEN:  Obese, nontender, nondistended with positive bowel sounds.  There is no hepatosplenomegaly.  No guarding.  No rebound.  EXTREMITIES:  Again show edema which is doughy/nonpitting.  It is  symmetrical.  Today is it a little bit tighter because she  has been  hanging her legs down for the most of the afternoon.  She does still  have a little bit of tenderness in the right shin but there is no  obvious trauma or injury.  NEUROLOGICAL:  Cranial nerves II-XII are intact.  She is back at her  baseline cognitively and has been up and around in the room.  Power is  currently 5/5 symmetrically in the upper extremities.  She dorsiflexes  evenly.  Sensation is intact.  PSYCHIATRIC:  Affect is appropriate.  Recent memory intact.  Judgment  and insight are intact.   PERTINENT LABORATORIES:  Total iron studies are within normal limits.  Her cultures have been negative x3 days in her blood.  Her urine culture  is growing Citrobacter freudii which is sensitive to the quinolone.  This morning her sodium was 137 but her potassium 2.7, chloride 103, CO2  25, BUN 11, creatinine 0.9 and glucose 178.   ASSESSMENT/PLAN:  This is a 65 year old African American female admitted  to the ICU with sepsis and imminent vascular collapse likely secondary  to adrenal insufficiency.  Source for infection appears to be a urinary  tract infection which is growing Citrobacter.  The patient's  hydrocortisone was decreased yesterday.  I will make another adjustment  today to ensure that she does not have any recurrent symptoms of  vascular collapse.  All of her lines have been discontinued to prevent  her from getting a secondary infection.  1. Urinary tract infection with Citrobacter.  Continue her on Cipro.      I am going to discontinue her Zosyn.  2. Adrenal insufficiency.  I have decreased her hydrocortisone up to      50 last night at q.8.  Tonight I will decrease her to 25 mg q.8 and      we will consider resuming her home dosage which were 10 in the      morning and 20 in the evening.  3. Sepsis, recovering well.  4. Glaucoma.  We will continue her eye drops.  5. Hypothyroidism.  For now, I am continuing her Synthroid.  We will      repeat those PFTs by  Dr. Dagoberto Ligas at discharge although we may have      better success in adjusting her by checking her in a couple of      weeks which she is out of the stressful time.  6. Indigestion, none reported.  I placed her on Protonix.  She likely      should be on a proton pump inhibitor while she is on increased      while on stress dose hydrocortisone.  7. Hypertension.  Held off on restarting her Norvasc until she is down      on basilar steroids.  I did request an echocardiogram which was      completed because of the slight bump in troponin.  Her ejection      fraction is 60% to 65%.  There is no wall motion abnormality.  She      does have elevated peak pulmonary artery pressure.  Because she has      no wall motion abnormalities, I will not pursue any further workup      as this slight bump in her troponin.  8. Hyperlipidemia.  We gave her Zocor 40.  9. Anemia.  Hemoglobin of 9.  Iron studies are within normal limits.      She may need to have this worked up as an outpatient.  It could be      anemia of chronic disease.   Total time with this patient approximately 25 minutes.      Melissa L. Ladona Ridgel, MD  Electronically Signed     MLT/MEDQ  D:  05/24/2009  T:  05/24/2009  Job:  [id number]

## 2011-02-13 NOTE — Group Therapy Note (Signed)
NAME:  Alexis Singh, PRIBYL NO.:  [id number]   MEDICAL RECORD NO.:  [id number]          PATIENT TYPE:  INP   LOCATION:  IC03                          FACILITY:  [facility]   PHYSICIAN:  Melissa L. Ladona Ridgel, MD  DATE OF BIRTH:  09-30-1946   DATE OF PROCEDURE:  05/21/2009  DATE OF DISCHARGE:                                 PROGRESS NOTE   SUBJECTIVE:  The patient is much improved this morning.  She is awake.  She is alert.  She denies nausea and vomiting.  She does complain of  some right shin pain.  Review of the events overnight reveal her blood  pressure has improved quite significantly.  She had no other  complications reported.   OBJECTIVE:  VITAL SIGNS:  Temperature was 98.3, blood pressure 125/65,  pulse 57, respirations 11, saturation 100%.  Intake was 1125, output was  900.  I suspect that there was other intake that was not recorded as she  did have at least 2 liters of IV fluid downstairs and so she is fairly  significantly positive.  She has had no stool overnight.  GENERAL:  This is a well-developed [place] American female who is in no  acute distress.  HEENT:  She is normocephalic, atraumatic.  Pupils are equal, round and  reactive to light.  Again, she does have slight muddying of the sclerae  but I cannot call it icterus at this time.  She has no extraocular  lesions.  No external auditory lesion.  Her nose is midline.  Septum is  not deviated.  She has no discharge.  Examination of mouth reveals no  oral lesions.  No lip lesions.  The patient does have some puffiness to  her lips and face which was on admission.  The puffiness of her face is  no worse than on admission.  NECK:  Neck is supple.  There is no JVD.  No lymph nodes.  No carotid  bruits.  She does have a right subclavian central line which is clean,  dry and intact.  CHEST:  Chest is decreased but clear to auscultation.  I do not  appreciate any rhonchi, rales or wheezes.  CARDIOVASCULAR:  Regular  rate and rhythm.  Positive S1, S2, no S3, S4.  She does have periods of bradycardia.  I do not appreciate any murmur,  rub or gallop.  Heart sounds are distant, however.  ABDOMEN:  Abdomen is obese, nontender, nondistended with positive bowel  sounds.  No hepatosplenomegaly, no guarding, no rebound.  EXTREMITIES:  Again show a doughy edema in the lower extremities and  ankles.  She complains of some shin pain which is associated mainly with  her shin on the right.  She does not complain of any calf pain.  NEUROLOGICAL:  Cranial nerves II-XII are intact.  She is more wakeful.  Power is again 4+/5 symmetrically in the upper extremities.  She dorsi  flexes evenly.  Sensation is intact.  PSYCHIATRIC:  Affect is appropriate.  Recent and remote memory intact.  Judgment and insight are intact.   PERTINENT LABORATORIES:  Sodium is 140, potassium  3.5, chloride 113,  bicarb 23, glucose is 150, BUN is 15, creatinine is 1.19, total protein  7.4, albumin 2.8, AST is 38, ALT is 28, total bilirubin remains at 4.0.  From last night her amylase and lipase are within normal limits.  Her  lactate level was 0.3.  Her troponin is slightly elevated at 0.11 which  is down from 0.21.  She has had no other elevations in her MB fraction  and describes no chest pain.  White count is 4.9 with a hemoglobin of  9.4, hematocrit 27 which is down from 11.8.  Her platelets are 126 which  is down from 171.  Review of telemetry reveals periods of bradycardia  without pause.  Chest x-ray shows no obvious pneumonia and her central  line was in appropriate position after being pulled back last evening.   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  She presents with sepsis and vascular collapse likely  secondary to adrenal insufficiency.  The patient has a urinary tract  infection which is likely her source for  sepsis.  The patient is being  treated with triple antibiotic therapy and stress doses of steroids and  is doing quite well this morning compared to yesterday.  1. Sepsis.  Continue the triple antibiotic of vancomycin, Zosyn and      Cipro.  Will readjust those doses for her improved kidney function.      I will try to discontinue the Foley as soon as possible but at this      time she has decreased urine output despite improvement in her      creatinine.  We await blood and urine cultures.  2. Urinary tract infection.  Await cultures and we will adjust her      antibiotics accordingly.  I will likely discontinue the Foley in      the morning.  3. Adrenal insufficiency.  Will continue stress dosed steroids and      wean her back to home doses very slowly.  4. Abnormal thyroid function studies.  The number suggests that she      appears to be inadequately replaced with her levothyroxine.  I did      speak with the on-call endocrinologist this morning and the      recommendation is to hold off on any changes at this time.  As she      becomes improved we will consider increasing her doses.  I will      check repeat free and total TF, T4 as she improves and then talk to      Dr. Dagoberto Ligas prior to discharge to see if we can make any      adjustments.  5. Diabetes.  For now I am holding her oral agents.  Will use sliding      scale insulin.  Please note the patient is on D5 because she was      hypoglycemic yesterday.  We may need to change that to normal      saline as the day goes on.  6. Anemia.  Will heme check stools and monitor her for that.  7. Elevated D-dimer on heparin.  I will obtain an ultrasound of the      lower extremities.  In face of the fact that her platelets are      decreased I may need to stop the  heparin and check a hep and DIC      panel but if she remains okay then we will not go forward with      that.  8. Will follow up ultrasound of her liver for the persistent       hyperbilirubinemia.  9. Hypokalemia.  That will be repleted p.o. and IV as necessary.  10.Nausea and vomiting have resolved and likely represented the sepsis      and adrenal insufficiency.  11.Thrombocytopenia.  Will recheck this afternoon and if they are      trending down will check a hep panel and a DIC panel and stop her      heparin.   Total time on this case was 45 minutes.      Melissa L. Ladona Ridgel, MD  Electronically Signed     MLT/MEDQ  D:  05/21/2009  T:  05/21/2009  Job:  [id number]

## 2011-05-23 ENCOUNTER — Encounter: Payer: Self-pay | Admitting: Obstetrics and Gynecology

## 2011-05-24 ENCOUNTER — Encounter: Payer: Self-pay | Admitting: Obstetrics and Gynecology

## 2011-07-23 DIAGNOSIS — E230 Hypopituitarism: Secondary | ICD-10-CM | POA: Insufficient documentation

## 2011-11-12 DIAGNOSIS — H2510 Age-related nuclear cataract, unspecified eye: Secondary | ICD-10-CM | POA: Insufficient documentation

## 2012-05-23 ENCOUNTER — Encounter: Payer: Self-pay | Admitting: Obstetrics and Gynecology

## 2012-06-13 ENCOUNTER — Encounter: Payer: Self-pay | Admitting: Gynecology

## 2012-06-13 DIAGNOSIS — E079 Disorder of thyroid, unspecified: Secondary | ICD-10-CM | POA: Insufficient documentation

## 2012-06-13 DIAGNOSIS — D497 Neoplasm of unspecified behavior of endocrine glands and other parts of nervous system: Secondary | ICD-10-CM | POA: Insufficient documentation

## 2012-06-13 DIAGNOSIS — C801 Malignant (primary) neoplasm, unspecified: Secondary | ICD-10-CM | POA: Insufficient documentation

## 2012-06-13 DIAGNOSIS — D219 Benign neoplasm of connective and other soft tissue, unspecified: Secondary | ICD-10-CM | POA: Insufficient documentation

## 2012-06-24 ENCOUNTER — Encounter: Payer: Self-pay | Admitting: Obstetrics and Gynecology

## 2012-06-24 ENCOUNTER — Ambulatory Visit (INDEPENDENT_AMBULATORY_CARE_PROVIDER_SITE_OTHER): Payer: Medicare Other | Admitting: Obstetrics and Gynecology

## 2012-06-24 VITALS — BP 144/88 | Ht 64.0 in | Wt 194.0 lb

## 2012-06-24 DIAGNOSIS — N8111 Cystocele, midline: Secondary | ICD-10-CM

## 2012-06-24 DIAGNOSIS — IMO0002 Reserved for concepts with insufficient information to code with codable children: Secondary | ICD-10-CM

## 2012-06-24 DIAGNOSIS — M8580 Other specified disorders of bone density and structure, unspecified site: Secondary | ICD-10-CM

## 2012-06-24 DIAGNOSIS — C50919 Malignant neoplasm of unspecified site of unspecified female breast: Secondary | ICD-10-CM

## 2012-06-24 DIAGNOSIS — Z780 Asymptomatic menopausal state: Secondary | ICD-10-CM

## 2012-06-24 DIAGNOSIS — M899 Disorder of bone, unspecified: Secondary | ICD-10-CM

## 2012-06-24 DIAGNOSIS — N952 Postmenopausal atrophic vaginitis: Secondary | ICD-10-CM

## 2012-06-24 NOTE — Progress Notes (Signed)
Patient came back to see me today for further followup. She is status post breast cancer of her left breast in 1994. She was treated with lumpectomy, chemotherapy and radiation. She had a normal mammogram in August. She is having no vaginal bleeding. She had a total abdominal hysterectomy, bilateral salpingo-oophorectomy for fibroids in 1990. She is having no pelvic pain. She has never had an abnormal Pap smear. Her last Pap smear was 2011. She has been treated for osteopenia with both Boniva and Evista  in the past. Her last bone density was through Dr. Daune Perch  office and she was told it was stable and without need for further treatment. She has had no fractures. She has a small cystocele with some urinary frequency but it is tolerable. She has hot flashes but they were getting less frequent. She does have atrophic vaginitis but does not have dyspareunia and therefore does not need treatment. She is having no urinary incontinence or dysuria.  ROS: 12 system review done. Pertinent positives above. Other positives are  diabetes mellitus, hypertension, hyperlipidemia, hyperthyroidism and glaucoma. 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  HEENT: Within normal limits. Kennon Portela present. Neck: No masses. Supraclavicular lymph nodes: Not enlarged. Breasts: Examined in both sitting and lying position. Symmetrical without skin changes or masses. Abdomen: Soft no masses guarding or rebound. No hernias. Pelvic: External within normal limits. BUS within normal limits. Vaginal examination shows poor  estrogen effect, first degree cystocele. Cervix and uterus absent. Adnexa within normal limits. Rectovaginal confirmatory. Extremities within normal limits.  Assessment: #1. Breast cancer-no existing disease #2. Osteopenia #3. Cystocele #4. Atrophic vaginitis #5. Hot flashes  Plan: Continue yearly mammograms. Continue periodic bone densities. Observation of above  problems.The new Pap smear guidelines were discussed with the patient. No pap done.

## 2012-06-24 NOTE — Patient Instructions (Signed)
Continue yearly mammograms. Continue periodic bone densities. 

## 2012-06-27 ENCOUNTER — Encounter: Payer: Self-pay | Admitting: Obstetrics and Gynecology

## 2012-10-07 ENCOUNTER — Other Ambulatory Visit (HOSPITAL_COMMUNITY): Payer: Self-pay | Admitting: Gastroenterology

## 2012-10-07 DIAGNOSIS — R110 Nausea: Secondary | ICD-10-CM

## 2012-10-13 ENCOUNTER — Encounter (HOSPITAL_COMMUNITY)
Admission: RE | Admit: 2012-10-13 | Discharge: 2012-10-13 | Disposition: A | Discharge status: Home / Self Care | Payer: Medicare Other | Source: Ambulatory Visit | Attending: Gastroenterology | Admitting: Gastroenterology

## 2012-10-13 DIAGNOSIS — R110 Nausea: Secondary | ICD-10-CM | POA: Insufficient documentation

## 2012-10-13 MED ORDER — TECHNETIUM TC 99M SULFUR COLLOID
2.0000 | Freq: Once | INTRAVENOUS | Status: AC | PRN
  Administered 2012-10-13: 2 via INTRAVENOUS

## 2012-12-05 ENCOUNTER — Emergency Department (HOSPITAL_COMMUNITY)
Admission: EM | Admit: 2012-12-05 | Discharge: 2012-12-05 | Disposition: A | Discharge status: Home / Self Care | Payer: Medicare Other | Attending: Emergency Medicine | Admitting: Emergency Medicine

## 2012-12-05 ENCOUNTER — Encounter (HOSPITAL_COMMUNITY): Payer: Self-pay | Admitting: *Deleted

## 2012-12-05 DIAGNOSIS — E039 Hypothyroidism, unspecified: Secondary | ICD-10-CM | POA: Insufficient documentation

## 2012-12-05 DIAGNOSIS — I10 Essential (primary) hypertension: Secondary | ICD-10-CM | POA: Insufficient documentation

## 2012-12-05 DIAGNOSIS — E669 Obesity, unspecified: Secondary | ICD-10-CM | POA: Insufficient documentation

## 2012-12-05 DIAGNOSIS — J3489 Other specified disorders of nose and nasal sinuses: Secondary | ICD-10-CM | POA: Insufficient documentation

## 2012-12-05 DIAGNOSIS — R0982 Postnasal drip: Secondary | ICD-10-CM | POA: Insufficient documentation

## 2012-12-05 DIAGNOSIS — Z79899 Other long term (current) drug therapy: Secondary | ICD-10-CM | POA: Insufficient documentation

## 2012-12-05 DIAGNOSIS — H40009 Preglaucoma, unspecified, unspecified eye: Secondary | ICD-10-CM | POA: Insufficient documentation

## 2012-12-05 DIAGNOSIS — Z853 Personal history of malignant neoplasm of breast: Secondary | ICD-10-CM | POA: Insufficient documentation

## 2012-12-05 DIAGNOSIS — Z8739 Personal history of other diseases of the musculoskeletal system and connective tissue: Secondary | ICD-10-CM | POA: Insufficient documentation

## 2012-12-05 DIAGNOSIS — J329 Chronic sinusitis, unspecified: Secondary | ICD-10-CM | POA: Insufficient documentation

## 2012-12-05 DIAGNOSIS — E785 Hyperlipidemia, unspecified: Secondary | ICD-10-CM | POA: Insufficient documentation

## 2012-12-05 DIAGNOSIS — Z8742 Personal history of other diseases of the female genital tract: Secondary | ICD-10-CM | POA: Insufficient documentation

## 2012-12-05 DIAGNOSIS — Z8639 Personal history of other endocrine, nutritional and metabolic disease: Secondary | ICD-10-CM | POA: Insufficient documentation

## 2012-12-05 DIAGNOSIS — Z862 Personal history of diseases of the blood and blood-forming organs and certain disorders involving the immune mechanism: Secondary | ICD-10-CM | POA: Insufficient documentation

## 2012-12-05 DIAGNOSIS — E119 Type 2 diabetes mellitus without complications: Secondary | ICD-10-CM | POA: Insufficient documentation

## 2012-12-05 LAB — GLUCOSE, CAPILLARY: Glucose-Capillary: 143 mg/dL — ABNORMAL HIGH (ref 70–99)

## 2012-12-05 MED ORDER — ONDANSETRON 8 MG PO TBDP
8.0000 mg | ORAL_TABLET | Freq: Once | ORAL | Status: AC
  Administered 2012-12-05: 8 mg via ORAL
  Filled 2012-12-05: qty 1

## 2012-12-05 MED ORDER — AMOXICILLIN 500 MG PO CAPS: 500.0000 mg | ORAL_CAPSULE | Freq: Three times a day (TID) | ORAL | Status: DC

## 2012-12-05 NOTE — ED Provider Notes (Signed)
History     CSN: [id number]  Arrival date & time 12/05/12  1455   First MD Initiated Contact with Patient 12/05/12 1605      Chief Complaint  Patient presents with  . Nausea     The history is provided by the patient.  sinus drainage  Onset- weeks ago Course - worsening Improved by - nothing Worsened by - nothing  Pt reports nasal congestion and post nasal drip for several weeks She denies sore throat She reports mild cough She reports she has vomiting once but no diarrhea No cp/sob.  No weakness.    Past Medical History  Diagnosis Date  . Glaucoma(365)   . Obesity   . Hyperlipidemia   . Thyroid disease     overactive thyroid  . Diabetes mellitus   . Cancer 1994    left breast  . Osteoporosis   . Pituitary tumor   . Hypertension   . Fibroid     Past Surgical History  Procedure Laterality Date  . Breast lumpectomy      with removal of lymph nodes/had chemo and radiation  . Pituitary tumor removal  1995 & 1999  . Tubal ligation    . Abdominal hysterectomy      TAH BSO  . Toe surgery    . Cholecystectomy    . Eye surgery      Laser  . Oophorectomy      BSO  . Wrist fracture surgery    . Vein procedure      Family History  Problem Relation Age of Onset  . Breast cancer Mother     Age 82  . Hypertension Mother   . Heart failure Father   . Hypertension Sister   . Diabetes Brother   . Hypertension Brother   . Cancer Brother     Unsure what type    History  Substance Use Topics  . Smoking status: Never Smoker   . Smokeless tobacco: Not on file  . Alcohol Use: No    OB History   Grav Para Term Preterm Abortions TAB SAB Ect Mult Living   2 1 1  1     1       Review of Systems  Constitutional: Negative for fever.  Respiratory: Negative for shortness of breath.     Allergies  Ambien; Ciprofloxacin hcl; and Codeine  Home Medications   Current Outpatient Rx  Name  Route  Sig  Dispense  Refill  . amoxicillin (AMOXIL) 500 MG capsule  Oral   Take 1 capsule (500 mg total) by mouth 3 (three) times daily.   21 capsule   0   . bimatoprost (LUMIGAN) 0.03 % ophthalmic drops   Both Eyes   Place 1 drop into both eyes at bedtime.           . brimonidine (ALPHAGAN P) 0.1 % SOLN   Both Eyes   Place 1 drop into both eyes 2 (two) times daily.          . dorzolamide-timolol (COSOPT) 22.3-6.8 MG/ML ophthalmic solution   Both Eyes   Place 1 drop into both eyes 2 (two) times daily.           Marland Kitchen glipiZIDE (GLUCOTROL XL) 2.5 MG 24 hr tablet   Oral   Take 2.5 mg by mouth daily.         . hydrochlorothiazide (,MICROZIDE/HYDRODIURIL,) 12.5 MG capsule   Oral   Take 12.5 mg by mouth daily.           Marland Kitchen  hydrocortisone (CORTEF) 20 MG tablet   Oral   Take 20 mg by mouth daily. And 1/2 by  Mouth in eveing         . levothyroxine (SYNTHROID, LEVOTHROID) 112 MCG tablet   Oral   Take 112 mcg by mouth daily.           Marland Kitchen losartan (COZAAR) 100 MG tablet   Oral   Take 100 mg by mouth daily.           . metFORMIN (GLUMETZA) 1000 MG (MOD) 24 hr tablet   Oral   Take 1,000 mg by mouth daily with breakfast.           . omeprazole (PRILOSEC) 20 MG capsule   Oral   Take 20 mg by mouth daily.           . pioglitazone (ACTOS) 45 MG tablet   Oral   Take 45 mg by mouth daily.           . simvastatin (ZOCOR) 40 MG tablet   Oral   Take 20 mg by mouth at bedtime.           . traZODone (DESYREL) 50 MG tablet   Oral   Take 50 mg by mouth at bedtime.             BP 150/84  Pulse 88  Temp(Src) 98.8 F (37.1 C)  Resp 20  Ht 5\' 4"  (1.626 m)  Wt 185 lb (83.915 kg)  BMI 31.74 kg/m2  SpO2 98%  Physical Exam CONSTITUTIONAL: Well developed/well nourished HEAD: Normocephalic/atraumatic EYES: EOMI/PERRL ENMT: Mucous membranes moist, nasal congestion.  Uvula midline.   NECK: supple no meningeal signs SPINE:entire spine nontender CV: S1/S2 noted, no murmurs/rubs/gallops noted LUNGS: Lungs are clear to  auscultation bilaterally, no apparent distress ABDOMEN: soft, nontender, no rebound or guarding NEURO: Pt is awake/alert, moves all extremitiesx4 EXTREMITIES: pulses normal, full ROM SKIN: warm, color normal PSYCH: no abnormalities of mood noted  ED Course  Procedures   1. Sinusitis     Pt well appearing.  Suspect sinusitis given symptoms and given duration will start antibiotics Due to nausea EKG and CBG ordered Otherwise well appearing and stable for d/c  MDM  Nursing notes including past medical history and social history reviewed and considered in documentation Labs/vital reviewed and considered        Date: 12/05/2012  Rate: 82  Rhythm: normal sinus rhythm  QRS Axis: left  Intervals: normal  ST/T Wave abnormalities: nonspecific ST changes  Conduction Disutrbances:none  Narrative Interpretation:   Old EKG Reviewed: unchanged    Joya Gaskins, MD 12/05/12 1747

## 2012-12-05 NOTE — ED Notes (Signed)
Blood glucose level 143.

## 2012-12-05 NOTE — ED Notes (Signed)
C/o simus problems, nausea and vomiting onset last night

## 2013-06-05 ENCOUNTER — Encounter: Payer: Self-pay | Admitting: Gynecology

## 2013-06-26 ENCOUNTER — Encounter: Payer: Medicare Other | Admitting: Gynecology

## 2013-06-30 DIAGNOSIS — H401133 Primary open-angle glaucoma, bilateral, severe stage: Secondary | ICD-10-CM | POA: Insufficient documentation

## 2013-07-03 ENCOUNTER — Encounter: Payer: Self-pay | Admitting: Gynecology

## 2013-07-03 ENCOUNTER — Ambulatory Visit (INDEPENDENT_AMBULATORY_CARE_PROVIDER_SITE_OTHER): Payer: Medicare Other | Admitting: Gynecology

## 2013-07-03 VITALS — BP 138/82 | Ht 64.0 in | Wt 179.0 lb

## 2013-07-03 DIAGNOSIS — M8580 Other specified disorders of bone density and structure, unspecified site: Secondary | ICD-10-CM

## 2013-07-03 DIAGNOSIS — N952 Postmenopausal atrophic vaginitis: Secondary | ICD-10-CM

## 2013-07-03 DIAGNOSIS — R21 Rash and other nonspecific skin eruption: Secondary | ICD-10-CM

## 2013-07-03 DIAGNOSIS — Z23 Encounter for immunization: Secondary | ICD-10-CM

## 2013-07-03 DIAGNOSIS — M899 Disorder of bone, unspecified: Secondary | ICD-10-CM

## 2013-07-03 DIAGNOSIS — R634 Abnormal weight loss: Secondary | ICD-10-CM

## 2013-07-03 DIAGNOSIS — N8111 Cystocele, midline: Secondary | ICD-10-CM

## 2013-07-03 DIAGNOSIS — Z853 Personal history of malignant neoplasm of breast: Secondary | ICD-10-CM

## 2013-07-03 MED ORDER — NYSTATIN-TRIAMCINOLONE 100000-0.1 UNIT/GM-% EX CREA: TOPICAL_CREAM | Freq: Three times a day (TID) | CUTANEOUS | Status: DC

## 2013-07-03 NOTE — Patient Instructions (Signed)
Influenza Vaccine (Flu Vaccine, Inactivated) 2013 2014 What You Need to Know WHY GET VACCINATED?  Influenza ("flu") is a contagious disease that spreads around the [place] every winter, usually between October and May.  Flu is caused by the influenza virus, and can be spread by coughing, sneezing, and close contact.  Anyone can get flu, but the risk of getting flu is highest among children. Symptoms come on suddenly and may last several days. They can include:  Fever or chills.  Sore throat.  Muscle aches.  Fatigue.  Cough.  Headache.  Runny or stuffy nose. Flu can make some people much sicker than others. These people include young children, people 65 and older, pregnant women, and people with certain health conditions such as heart, lung or kidney disease, or a weakened immune system. Flu vaccine is especially important for these people, and anyone in close contact with them. Flu can also lead to pneumonia, and make existing medical conditions worse. It can cause diarrhea and seizures in children. Each year thousands of people in the [place] die from flu, and many more are hospitalized. Flu vaccine is the best protection we have from flu and its complications. Flu vaccine also helps prevent spreading flu from person to person. INACTIVATED FLU VACCINE There are 2 types of influenza vaccine:  You are getting an inactivated flu vaccine, which does not contain any live influenza virus. It is given by injection with a needle, and often called the "flu shot."  A different live, attenuated (weakened) influenza vaccine is sprayed into the nostrils. This vaccine is described in a separate Vaccine Information Statement. Flu vaccine is recommended every year. Children 6 months through 8 years of age should get 2 doses the first year they get vaccinated. Flu viruses are always changing. Each year's flu vaccine is made to protect from viruses that are most likely to cause disease  that year. While flu vaccine cannot prevent all cases of flu, it is our best defense against the disease. Inactivated flu vaccine protects against 3 or 4 different influenza viruses. It takes about 2 weeks for protection to develop after the vaccination, and protection lasts several months to a year. Some illnesses that are not caused by influenza virus are often mistaken for flu. Flu vaccine will not prevent these illnesses. It can only prevent influenza. A "high-dose" flu vaccine is available for people 65 years of age and older. The person giving you the vaccine can tell you more about it. Some inactivated flu vaccine contains a very small amount of a mercury-based preservative called thimerosal. Studies have shown that thimerosal in vaccines is not harmful, but flu vaccines that do not contain a preservative are available. SOME PEOPLE SHOULD NOT GET THIS VACCINE Tell the person who gives you the vaccine:  If you have any severe (life-threatening) allergies. If you ever had a life-threatening allergic reaction after a dose of flu vaccine, or have a severe allergy to any part of this vaccine, you may be advised not to get a dose. Most, but not all, types of flu vaccine contain a small amount of egg.  If you ever had Guillain Barr Syndrome (a severe paralyzing illness, also called GBS). Some people with a history of GBS should not get this vaccine. This should be discussed with your doctor.  If you are not feeling well. They might suggest waiting until you feel better. But you should come back. RISKS OF A VACCINE REACTION With a vaccine, like any medicine, there   is a chance of side effects. These are usually mild and go away on their own. Serious side effects are also possible, but are very rare. Inactivated flu vaccine does not contain live flu virus, sogetting flu from this vaccine is not possible. Brief fainting spells and related symptoms (such as jerking movements) can happen after any medical  procedure, including vaccination. Sitting or lying down for about 15 minutes after a vaccination can help prevent fainting and injuries caused by falls. Tell your doctor if you feel dizzy or lightheaded, or have vision changes or ringing in the ears. Mild problems following inactivated flu vaccine:  Soreness, redness, or swelling where the shot was given.  Hoarseness; sore, red or itchy eyes; or cough.  Fever.  Aches.  Headache.  Itching.  Fatigue. If these problems occur, they usually begin soon after the shot and last 1 or 2 days. Moderate problems following inactivated flu vaccine:  Young children who get inactivated flu vaccine and pneumococcal vaccine (PCV13) at the same time may be at increased risk for seizures caused by fever. Ask your doctor for more information. Tell your doctor if a child who is getting flu vaccine has ever had a seizure. Severe problems following inactivated flu vaccine:  A severe allergic reaction could occur after any vaccine (estimated less than 1 in a million doses).  There is a small possibility that inactivated flu vaccine could be associated with Guillan Barr Syndrome (GBS), no more than 1 or 2 cases per million people vaccinated. This is much lower than the risk of severe complications from flu, which can be prevented by flu vaccine. The safety of vaccines is always being monitored. For more information, visit: www.cdc.gov/vaccinesafety/ WHAT IF THERE IS A SERIOUS REACTION? What should I look for?  Look for anything that concerns you, such as signs of a severe allergic reaction, very high fever, or behavior changes. Signs of a severe allergic reaction can include hives, swelling of the face and throat, difficulty breathing, a fast heartbeat, dizziness, and weakness. These would start a few minutes to a few hours after the vaccination. What should I do?  If you think it is a severe allergic reaction or other emergency that cannot wait, call 9 1 1  or get the person to the nearest hospital. Otherwise, call your doctor.  Afterward, the reaction should be reported to the Vaccine Adverse Event Reporting System (VAERS). Your doctor might file this report, or you can do it yourself through the VAERS website at www.vaers.hhs.gov, or by calling 1-800-822-7967. VAERS is only for reporting reactions. They do not give medical advice. THE NATIONAL VACCINE INJURY COMPENSATION PROGRAM The National Vaccine Injury Compensation Program (VICP) is a federal program that was created to compensate people who may have been injured by certain vaccines. Persons who believe they may have been injured by a vaccine can learn about the program and about filing a claim by calling 1-800-338-2382 or visiting the VICP website at www.hrsa.gov/vaccinecompensation HOW CAN I LEARN MORE?  Ask your doctor.  Call your local or state health department.  Contact the Centers for Disease Control and Prevention (CDC):  Call 1-800-232-4636 (1-800-CDC-INFO) or  Visit CDC's website at www.cdc.gov/flu CDC Inactivated Influenza Vaccine Interim VIS (04/25/12) Document Released: 07/12/2006 Document Revised: 06/11/2012 Document Reviewed: 04/25/2012 [employer] Patient Information 2014 [employer].  

## 2013-07-03 NOTE — Progress Notes (Signed)
Alexis Singh 1946/05/12 [id number]   History:    67 y.o.  for GYN followup. Patient with past history of osteoporosis had been several years ago on Boniva and Evista. Her last bone density study was in 2010 and her lowest T score was the AP spine with a value -1.6.She is status post breast cancer of her left breast in 1994. She was treated with lumpectomy, chemotherapy and radiation. She had a normal mammogram in August. She is having no vaginal bleeding. She had a total abdominal hysterectomy, bilateral salpingo-oophorectomy for fibroids in 1990. She is having no pelvic pain. She has never had an abnormal Pap smear. Her last Pap smear was 2011. She has a small cystocele with some urinary frequency but it is tolerable. She has hot flashes but they were getting less frequent. She does have atrophic vaginitis but does not have dyspareunia and therefore does not need treatment. She is having no urinary incontinence or dysuria.  Patient with history of diabetes mellitus, hypertension, hyperlipidemia, hyperthyroidism and glaucoma. 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  Patient reports her last colonoscopy was normal in 2011. Her PCP is Dr. Kari Baars. Entered endocrinologist is Dr. Sharl Ma who has been monitoring her diabetes and her hyperthyroidism as well as glaucoma and hypertension. Patient stated she has lost some weight. At times she feels full at 38. She denies any hematochezia.   Past medical history,surgical history, family history and social history were all reviewed and documented in the EPIC chart.  Gynecologic History No LMP recorded. Patient has had a hysterectomy. Contraception: status post hysterectomy Last Pap: 2011. Results were: normal Last mammogram: 2014. Results were: normal but dense  Obstetric History OB History  Gravida Para Term Preterm AB SAB TAB Ectopic Multiple Living  2 1 1  1     1     # Outcome Date GA Lbr Len/2nd  Weight Sex Delivery Anes PTL Lv  2 ABT           1 TRM                ROS: A ROS was performed and pertinent positives and negatives are included in the history.  GENERAL: No fevers or chills. HEENT: No change in vision, no earache, sore throat or sinus congestion. NECK: No pain or stiffness. CARDIOVASCULAR: No chest pain or pressure. No palpitations. PULMONARY: No shortness of breath, cough or wheeze. GASTROINTESTINAL: No abdominal pain, nausea, vomiting or diarrhea, melena or bright red blood per rectum. GENITOURINARY: No urinary frequency, urgency, hesitancy or dysuria. MUSCULOSKELETAL: No joint or muscle pain, no back pain, no recent trauma. DERMATOLOGIC: No rash, no itching, no lesions. ENDOCRINE: No polyuria, polydipsia, no heat or cold intolerance. No recent change in weight. HEMATOLOGICAL: No anemia or easy bruising or bleeding. NEUROLOGIC: No headache, seizures, numbness, tingling or weakness. PSYCHIATRIC: No depression, no loss of interest in normal activity or change in sleep pattern.     Exam: chaperone present  BP 138/82  Ht 5\' 4"  (1.626 m)  Wt 179 lb (81.194 kg)  BMI 30.71 kg/m2  Body mass index is 30.71 kg/(m^2).  General appearance : Well developed well nourished female. No acute distress HEENT: Neck supple, trachea midline, no carotid bruits, no thyroidmegaly Lungs: Clear to auscultation, no rhonchi or wheezes, or rib retractions  Heart: Regular rate and rhythm, no murmurs or gallops Breast:Examined in sitting and supine position were symmetrical in appearance, no palpable masses or tenderness,  no  skin retraction, no nipple inversion, no nipple discharge, no skin discoloration, no axillary or supraclavicular lymphadenopathy Abdomen: no palpable masses or tenderness, no rebound or guarding Extremities: no edema or skin discoloration or tenderness  Pelvic:  Bartholin, Urethra, Skene Glands: Within normal limits             Vagina: No gross lesions or discharge  Cervix:  absent  Uterus: Absent            Adnexa  Without masses or tenderness  Anus and perineum  normal   Rectovaginal  normal sphincter tone without palpated masses or tenderness             Hemoccult card provided by her PCP     Assessment/Plan:  67 y.o. female for annual exam with past history 1994 of left breast lumpectomy as a result of breast cancer. Patient has done well after chemotherapy and radiation therapy. Her mammograms are up-to-date. She does have some slight vaginal atrophy but not bothersome. Patient will continue to follow with her endocrinologist and PCP for blood work. Pap smear not done today in accordance to the new guidelines. She will schedule for bone density study here in the office in the next few weeks. Prescription for patient to get her shingles vaccine was provided as well.  Note: This dictation was prepared with  Dragon/digital dictation along withSmart phrase technology. Any transcriptional errors that result from this process are unintentional.   Ok Edwards MD, 11:26 AM 07/03/2013

## 2013-10-02 ENCOUNTER — Encounter (HOSPITAL_COMMUNITY): Payer: Self-pay | Admitting: Emergency Medicine

## 2013-10-02 ENCOUNTER — Emergency Department (HOSPITAL_COMMUNITY)
Admission: EM | Admit: 2013-10-02 | Discharge: 2013-10-02 | Disposition: A | Discharge status: Home / Self Care | Payer: Medicare HMO | Attending: Emergency Medicine | Admitting: Emergency Medicine

## 2013-10-02 DIAGNOSIS — R5381 Other malaise: Secondary | ICD-10-CM | POA: Insufficient documentation

## 2013-10-02 DIAGNOSIS — Z7982 Long term (current) use of aspirin: Secondary | ICD-10-CM | POA: Insufficient documentation

## 2013-10-02 DIAGNOSIS — Z8739 Personal history of other diseases of the musculoskeletal system and connective tissue: Secondary | ICD-10-CM | POA: Insufficient documentation

## 2013-10-02 DIAGNOSIS — E860 Dehydration: Secondary | ICD-10-CM | POA: Insufficient documentation

## 2013-10-02 DIAGNOSIS — R110 Nausea: Secondary | ICD-10-CM | POA: Insufficient documentation

## 2013-10-02 DIAGNOSIS — M545 Low back pain, unspecified: Secondary | ICD-10-CM | POA: Insufficient documentation

## 2013-10-02 DIAGNOSIS — Z8669 Personal history of other diseases of the nervous system and sense organs: Secondary | ICD-10-CM | POA: Insufficient documentation

## 2013-10-02 DIAGNOSIS — IMO0002 Reserved for concepts with insufficient information to code with codable children: Secondary | ICD-10-CM | POA: Insufficient documentation

## 2013-10-02 DIAGNOSIS — R197 Diarrhea, unspecified: Secondary | ICD-10-CM

## 2013-10-02 DIAGNOSIS — E079 Disorder of thyroid, unspecified: Secondary | ICD-10-CM | POA: Insufficient documentation

## 2013-10-02 DIAGNOSIS — Z8742 Personal history of other diseases of the female genital tract: Secondary | ICD-10-CM | POA: Insufficient documentation

## 2013-10-02 DIAGNOSIS — I10 Essential (primary) hypertension: Secondary | ICD-10-CM | POA: Insufficient documentation

## 2013-10-02 DIAGNOSIS — R630 Anorexia: Secondary | ICD-10-CM | POA: Insufficient documentation

## 2013-10-02 DIAGNOSIS — Z79899 Other long term (current) drug therapy: Secondary | ICD-10-CM | POA: Insufficient documentation

## 2013-10-02 DIAGNOSIS — Z853 Personal history of malignant neoplasm of breast: Secondary | ICD-10-CM | POA: Insufficient documentation

## 2013-10-02 DIAGNOSIS — R5383 Other fatigue: Secondary | ICD-10-CM

## 2013-10-02 DIAGNOSIS — E669 Obesity, unspecified: Secondary | ICD-10-CM | POA: Insufficient documentation

## 2013-10-02 DIAGNOSIS — E119 Type 2 diabetes mellitus without complications: Secondary | ICD-10-CM | POA: Insufficient documentation

## 2013-10-02 DIAGNOSIS — E785 Hyperlipidemia, unspecified: Secondary | ICD-10-CM | POA: Insufficient documentation

## 2013-10-02 LAB — CBC WITH DIFFERENTIAL/PLATELET
Basophils Absolute: 0 10*3/uL (ref 0.0–0.1)
Basophils Relative: 0 % (ref 0–1)
EOS PCT: 2 % (ref 0–5)
Eosinophils Absolute: 0.1 10*3/uL (ref 0.0–0.7)
HEMATOCRIT: 33.5 % — AB (ref 36.0–46.0)
Hemoglobin: 11 g/dL — ABNORMAL LOW (ref 12.0–15.0)
LYMPHS ABS: 0.9 10*3/uL (ref 0.7–4.0)
LYMPHS PCT: 24 % (ref 12–46)
MCH: 28.9 pg (ref 26.0–34.0)
MCHC: 32.8 g/dL (ref 30.0–36.0)
MCV: 88.2 fL (ref 78.0–100.0)
MONO ABS: 0.2 10*3/uL (ref 0.1–1.0)
Monocytes Relative: 5 % (ref 3–12)
Neutro Abs: 2.6 10*3/uL (ref 1.7–7.7)
Neutrophils Relative %: 69 % (ref 43–77)
Platelets: 258 10*3/uL (ref 150–400)
RBC: 3.8 MIL/uL — AB (ref 3.87–5.11)
RDW: 12.3 % (ref 11.5–15.5)
WBC: 3.8 10*3/uL — AB (ref 4.0–10.5)

## 2013-10-02 LAB — URINALYSIS, ROUTINE W REFLEX MICROSCOPIC
BILIRUBIN URINE: NEGATIVE
GLUCOSE, UA: NEGATIVE mg/dL
Nitrite: NEGATIVE
Specific Gravity, Urine: >1.03 — ABNORMAL HIGH (ref 1.005–1.030)
Urobilinogen, UA: 0.2 mg/dL (ref 0.0–1.0)
pH: 5 (ref 5.0–8.0)

## 2013-10-02 LAB — URINE MICROSCOPIC-ADD ON

## 2013-10-02 LAB — BASIC METABOLIC PANEL
BUN: 23 mg/dL (ref 6–23)
CALCIUM: 9.6 mg/dL (ref 8.4–10.5)
CO2: 27 meq/L (ref 19–32)
CREATININE: 1.62 mg/dL — AB (ref 0.50–1.10)
Chloride: 101 mEq/L (ref 96–112)
GFR calc Af Amer: 37 mL/min — ABNORMAL LOW (ref >90)
GFR calc non Af Amer: 32 mL/min — ABNORMAL LOW (ref >90)
GLUCOSE: 186 mg/dL — AB (ref 70–99)
Potassium: 4.1 mEq/L (ref 3.7–5.3)
Sodium: 138 mEq/L (ref 137–147)

## 2013-10-02 LAB — GLUCOSE, CAPILLARY: Glucose-Capillary: 146 mg/dL — ABNORMAL HIGH (ref 70–99)

## 2013-10-02 MED ORDER — SODIUM CHLORIDE 0.9 % IV SOLN
1000.0000 mL | Freq: Once | INTRAVENOUS | Status: AC
  Administered 2013-10-02: 1000 mL via INTRAVENOUS

## 2013-10-02 MED ORDER — SODIUM CHLORIDE 0.9 % IV BOLUS (SEPSIS)
1000.0000 mL | Freq: Once | INTRAVENOUS | Status: AC
  Administered 2013-10-02: 1000 mL via INTRAVENOUS

## 2013-10-02 MED ORDER — SODIUM CHLORIDE 0.9 % IV SOLN: 1000.0000 mL | INTRAVENOUS | Status: DC

## 2013-10-02 MED ORDER — HYDROCORTISONE SOD SUCCINATE 100 MG IJ SOLR
100.0000 mg | Freq: Once | INTRAMUSCULAR | Status: AC
  Administered 2013-10-02: 100 mg via INTRAVENOUS
  Filled 2013-10-02: qty 2

## 2013-10-02 NOTE — Discharge Instructions (Signed)
You can take imodium OTC for diarrhea if it persists. Avoid milk products until the diarrhea is gone for a couple of days. Recheck if you get worse again.  Diarrhea Diarrhea is frequent loose and watery bowel movements. It can cause you to feel weak and dehydrated. Dehydration can cause you to become tired and thirsty, have a dry mouth, and have decreased urination that often is dark yellow. Diarrhea is a sign of another problem, most often an infection that will not last long. In most cases, diarrhea typically lasts 2 3 days. However, it can last longer if it is a sign of something more serious. It is important to treat your diarrhea as directed by your caregive to lessen or prevent future episodes of diarrhea. CAUSES  Some common causes include:  Gastrointestinal infections caused by viruses, bacteria, or parasites.  Food poisoning or food allergies.  Certain medicines, such as antibiotics, chemotherapy, and laxatives.  Artificial sweeteners and fructose.  Digestive disorders. HOME CARE INSTRUCTIONS  Ensure adequate fluid intake (hydration): have 1 cup (8 oz) of fluid for each diarrhea episode. Avoid fluids that contain simple sugars or sports drinks, fruit juices, whole milk products, and sodas. Your urine should be clear or pale yellow if you are drinking enough fluids. Hydrate with an oral rehydration solution that you can purchase at pharmacies, retail stores, and online. You can prepare an oral rehydration solution at home by mixing the following ingredients together:    tsp table salt.   tsp baking soda.   tsp salt substitute containing potassium chloride.  1  tablespoons sugar.  1 L (34 oz) of water.  Certain foods and beverages may increase the speed at which food moves through the gastrointestinal (GI) tract. These foods and beverages should be avoided and include:  Caffeinated and alcoholic beverages.  High-fiber foods, such as raw fruits and vegetables, nuts, seeds, and  whole grain breads and cereals.  Foods and beverages sweetened with sugar alcohols, such as xylitol, sorbitol, and mannitol.  Some foods may be well tolerated and may help thicken stool including:  Starchy foods, such as rice, toast, pasta, low-sugar cereal, oatmeal, grits, baked potatoes, crackers, and bagels.  Bananas.  Applesauce.  Add probiotic-rich foods to help increase healthy bacteria in the GI tract, such as yogurt and fermented milk products.  Wash your hands well after each diarrhea episode.  Only take over-the-counter or prescription medicines as directed by your caregiver.  Take a warm bath to relieve any burning or pain from frequent diarrhea episodes. SEEK IMMEDIATE MEDICAL CARE IF:   You are unable to keep fluids down.  You have persistent vomiting.  You have blood in your stool, or your stools are black and tarry.  You do not urinate in 6 8 hours, or there is only a small amount of very dark urine.  You have abdominal pain that increases or localizes.  You have weakness, dizziness, confusion, or lightheadedness.  You have a severe headache.  Your diarrhea gets worse or does not get better.  You have a fever or persistent symptoms for more than 2 3 days.  You have a fever and your symptoms suddenly get worse. MAKE SURE YOU:   Understand these instructions.  Will watch your condition.  Will get help right away if you are not doing well or get worse. Document Released: 09/07/2002 Document Revised: 09/03/2012 Document Reviewed: 05/25/2012 [facility] Patient Information 2014 [place], [place].

## 2013-10-02 NOTE — ED Provider Notes (Signed)
CSN: [id number]     Arrival date & time 10/02/13  1432 History   This chart was scribed for Alexis Norrie, MD by [name], ED Scribe. This patient was seen in room APA07/APA07 and the patient's care was started at 5:27 PM.   Chief Complaint  Patient presents with  . Weakness  . Diarrhea    The history is provided by the patient. No language interpreter was used.    HPI Comments: Alexis Singh is a 68 y.o. female who presents to the Emergency Department complaining of persistent generalized weakness that has been present for the past 3 days. She states that movement increases the weakness and causes "hot spells". Pt states that she experienced epigastric abdominal pain that she noted upon waking 3 days ago. She describes the pain as a sharp constant sensation that radiated into her lower substernal area. She denies any associated burning. She states that she slept well that night after eating soup but woke up the next morning feeling extremely weak. She states that the weakness has persisted since with the new development of diarrhea last night. She reports 4 episodes since the onset of the diarrhea last night. She also c/o transient nausea and a mildly decreased appetite as well but denies any currently.She had some pain in her left lower back yesterday that went away as she started moving around and was described as dull, however it is gone now. She denies any known fevers. She denies any known sick contacts or recent suspect food intakes. 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 She has a h/o DM that is well controlled with medications. She denies smoking or alcohol use.   PCP is Dr. Luan Pulling  Past Medical History  Diagnosis Date  . Glaucoma   . Obesity   . Hyperlipidemia   . Thyroid disease     overactive thyroid  . Diabetes mellitus   . Cancer 1994    left breast  . Osteoporosis   . Pituitary tumor   . Hypertension   .  Fibroid    Past Surgical History  Procedure Laterality Date  . Breast lumpectomy      with removal of lymph nodes/had chemo and radiation  . Pituitary tumor removal  [place]  . Tubal ligation    . Abdominal hysterectomy      TAH BSO  . Toe surgery    . Cholecystectomy    . Eye surgery      Laser  . Oophorectomy      BSO  . Wrist fracture surgery    . Vein procedure     Family History  Problem Relation Age of Onset  . Breast cancer Mother     Age 25  . Hypertension Mother   . Heart failure Father   . Hypertension Sister   . Diabetes Brother   . Hypertension Brother   . Cancer Brother     Unsure what type   History  Substance Use Topics  . Smoking status: Never Smoker   . Smokeless tobacco: Not on file  . Alcohol Use: No  volunteer some day at [employer] Otherwise retired.   OB History   Grav Para Term Preterm Abortions TAB SAB Ect Mult Living   2 1 1  1     1      Review of Systems  Constitutional: Positive for appetite change. Negative for fever.  Gastrointestinal: Positive for nausea, abdominal  pain and diarrhea. Negative for vomiting.  Neurological: Positive for weakness.  All other systems reviewed and are negative.    Allergies  Ambien; Ciprofloxacin hcl; and Codeine  Home Medications   Current Outpatient Rx  Name  Route  Sig  Dispense  Refill  . aspirin EC 81 MG tablet   Oral   Take 81 mg by mouth daily.         Marland Kitchen atorvastatin (LIPITOR) 40 MG tablet   Oral   Take 40 mg by mouth daily.         . brimonidine (ALPHAGAN P) 0.1 % SOLN   Both Eyes   Place 1 drop into both eyes 2 (two) times daily.          . dorzolamide-timolol (COSOPT) 22.3-6.8 MG/ML ophthalmic solution   Both Eyes   Place 1 drop into both eyes 2 (two) times daily.           Marland Kitchen glipiZIDE (GLUCOTROL XL) 2.5 MG 24 hr tablet   Oral   Take 2.5 mg by mouth daily.         . hydrocortisone (CORTEF) 10 MG tablet   Oral   Take 5-15 mg by mouth daily. Patient takes  15mg  in the morning and 5mg  in the afternoon         . levothyroxine (SYNTHROID, LEVOTHROID) 88 MCG tablet   Oral   Take 88 mcg by mouth daily before breakfast.         . losartan (COZAAR) 100 MG tablet   Oral   Take 100 mg by mouth daily.           Marland Kitchen nystatin-triamcinolone (MYCOLOG II) cream   Topical   Apply topically 3 (three) times daily.   30 g   2    Triage Vitals: BP 122/71  Pulse 73  Temp(Src) 98.4 F (36.9 C) (Oral)  Resp 18  Ht 5\' 4"  (1.626 m)  Wt 174 lb (78.926 kg)  BMI 29.85 kg/m2  SpO2 99%  Vital signs normal    Physical Exam  Nursing note and vitals reviewed. Constitutional: She is oriented to person, place, and time. She appears well-developed and well-nourished.  Non-toxic appearance. She does not appear ill. No distress.  HENT:  Head: Normocephalic and atraumatic.  Right Ear: External ear normal.  Left Ear: External ear normal.  Nose: Nose normal. No mucosal edema or rhinorrhea.  Mouth/Throat: No dental abscesses or uvula swelling.  Very dry MM  Eyes: Conjunctivae and EOM are normal. Pupils are equal, round, and reactive to light.  Neck: Normal range of motion and full passive range of motion without pain. Neck supple.  Cardiovascular: Normal rate, regular rhythm and normal heart sounds.  Exam reveals no gallop and no friction rub.   No murmur heard. Pulmonary/Chest: Effort normal and breath sounds normal. No respiratory distress. She has no wheezes. She has no rhonchi. She has no rales. She exhibits no tenderness and no crepitus.  Abdominal: Soft. Normal appearance and bowel sounds are normal. She exhibits no distension. There is no tenderness. There is no rebound and no guarding.  Musculoskeletal: Normal range of motion. She exhibits no edema and no tenderness.  Moves all extremities well.   Neurological: She is alert and oriented to person, place, and time. She has normal strength. No cranial nerve deficit.  Skin: Skin is warm, dry and  intact. No rash noted. No erythema. No pallor.  Psychiatric: She has a normal mood and affect. Her speech is  normal and behavior is normal. Her mood appears not anxious.    ED Course  Procedures (including critical care time)  Medications  0.9 %  sodium chloride infusion (1,000 mLs Intravenous New Bag/Given 10/02/13 1955)    Followed by  0.9 %  sodium chloride infusion (not administered)  sodium chloride 0.9 % bolus 1,000 mL (0 mLs Intravenous Stopped 10/02/13 1955)  hydrocortisone sodium succinate (SOLU-CORTEF) 100 mg/2 mL injection 100 mg (100 mg Intravenous Given 10/02/13 1805)    DIAGNOSTIC STUDIES: Oxygen Saturation is 99% on RA, normal by my interpretation.    COORDINATION OF CARE: 5:35 PM-Advised pt that with her h/o pituitary problems that viral illnesses can cause weakness. Reviewed pt's lab work results thus far which indicate mild dehydration and mildly elevated creatinine. Discussed treatment plan which includes IV fluids and hydrocortisone with pt at bedside and pt agreed to plan.   Pt given an IV dose of hydrocortisone because she is on it chronically. We discussed taking a higher dose while she is ill.   7:38 Pm-Pt rechecked and feels improved. She is receiving a 2nd bag of fluids. Pt is comfortable getting the rest of the IV fluids prior to discharge. Advised pt to avoid milk products and heavy, greasy foods while having diarrhea and encouraged her to take imodium and eat a bland diet until the symptoms resolve.   Results for orders placed during the hospital encounter of 10/02/13  CBC WITH DIFFERENTIAL      Result Value Range   WBC 3.8 (*) 4.0 - 10.5 K/uL   RBC 3.80 (*) 3.87 - 5.11 MIL/uL   Hemoglobin 11.0 (*) 12.0 - 15.0 g/dL   HCT 33.5 (*) 36.0 - 46.0 %   MCV 88.2  78.0 - 100.0 fL   MCH 28.9  26.0 - 34.0 pg   MCHC 32.8  30.0 - 36.0 g/dL   RDW 12.3  11.5 - 15.5 %   Platelets 258  150 - 400 K/uL   Neutrophils Relative % 69  43 - 77 %   Neutro Abs 2.6  1.7 - 7.7 K/uL    Lymphocytes Relative 24  12 - 46 %   Lymphs Abs 0.9  0.7 - 4.0 K/uL   Monocytes Relative 5  3 - 12 %   Monocytes Absolute 0.2  0.1 - 1.0 K/uL   Eosinophils Relative 2  0 - 5 %   Eosinophils Absolute 0.1  0.0 - 0.7 K/uL   Basophils Relative 0  0 - 1 %   Basophils Absolute 0.0  0.0 - 0.1 K/uL  BASIC METABOLIC PANEL      Result Value Range   Sodium 138  137 - 147 mEq/L   Potassium 4.1  3.7 - 5.3 mEq/L   Chloride 101  96 - 112 mEq/L   CO2 27  19 - 32 mEq/L   Glucose, Bld 186 (*) 70 - 99 mg/dL   BUN 23  6 - 23 mg/dL   Creatinine, Ser 1.62 (*) 0.50 - 1.10 mg/dL   Calcium 9.6  8.4 - 10.5 mg/dL   GFR calc non Af Amer 32 (*) >90 mL/min   GFR calc Af Amer 37 (*) >90 mL/min  URINALYSIS, ROUTINE W REFLEX MICROSCOPIC      Result Value Range   Color, Urine YELLOW  YELLOW   APPearance CLEAR  CLEAR   Specific Gravity, Urine >1.030 (*) 1.005 - 1.030   pH 5.0  5.0 - 8.0   Glucose, UA NEGATIVE  NEGATIVE mg/dL  Hgb urine dipstick TRACE (*) NEGATIVE   Bilirubin Urine NEGATIVE  NEGATIVE   Ketones, ur TRACE (*) NEGATIVE mg/dL   Protein, ur TRACE (*) NEGATIVE mg/dL   Urobilinogen, UA 0.2  0.0 - 1.0 mg/dL   Nitrite NEGATIVE  NEGATIVE   Leukocytes, UA TRACE (*) NEGATIVE  GLUCOSE, CAPILLARY      Result Value Range   Glucose-Capillary 146 (*) 70 - 99 mg/dL  URINE MICROSCOPIC-ADD ON      Result Value Range   Squamous Epithelial / LPF FEW (*) RARE   WBC, UA 3-6  <3 WBC/hpf   RBC / HPF 0-2  <3 RBC/hpf   Bacteria, UA FEW (*) RARE   Laboratory interpretation all normal except stable anemia, new renal insufficiency from a couple of years ago which could be from dehydration or underlying CRI, concentrated UA c/w dehydration     EKG Interpretation    Date/Time:  Friday October 02 2013 14:52:39 EST Ventricular Rate:  68 PR Interval:  168 QRS Duration: 80 QT Interval:  446 QTC Calculation: 474 R Axis:   -19 Text Interpretation:  Normal sinus rhythm Inferior infarct (cited on or before  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 Diarrhea   2. Dehydration      Plan discharge   Rolland Porter, MD, FACEP   I personally performed the services described in this documentation, which was scribed in my presence. The recorded information has been reviewed and considered.  Rolland Porter, MD, Abram Sander   Alexis Norrie, MD 10/02/13 2025

## 2013-10-02 NOTE — ED Notes (Signed)
Pt reports decreased appetite and nausea since Monday.  Reports had 1 episode of diarrhea yesterday.  C/o generalized weakness today.

## 2013-10-02 NOTE — ED Notes (Signed)
Pt c/o generalized weakness and nausea x3-4 days. Pt also reports one episode of diarrhea yesterday but denies vomiting. Pt also denies fever, SOB, pain.

## 2013-10-04 LAB — URINE CULTURE

## 2013-10-22 ENCOUNTER — Other Ambulatory Visit: Payer: Self-pay | Admitting: Internal Medicine

## 2013-10-22 DIAGNOSIS — R748 Abnormal levels of other serum enzymes: Secondary | ICD-10-CM

## 2013-10-27 ENCOUNTER — Ambulatory Visit
Admission: RE | Admit: 2013-10-27 | Discharge: 2013-10-27 | Disposition: A | Discharge status: Home / Self Care | Payer: Medicare HMO | Source: Ambulatory Visit | Attending: Internal Medicine | Admitting: Internal Medicine

## 2013-10-27 DIAGNOSIS — R748 Abnormal levels of other serum enzymes: Secondary | ICD-10-CM

## 2013-10-31 ENCOUNTER — Emergency Department (HOSPITAL_COMMUNITY): Payer: Medicare HMO

## 2013-10-31 ENCOUNTER — Emergency Department (HOSPITAL_COMMUNITY)
Admission: EM | Admit: 2013-10-31 | Discharge: 2013-10-31 | Disposition: A | Discharge status: Home / Self Care | Payer: Medicare HMO | Attending: Emergency Medicine | Admitting: Emergency Medicine

## 2013-10-31 ENCOUNTER — Encounter (HOSPITAL_COMMUNITY): Payer: Self-pay | Admitting: Emergency Medicine

## 2013-10-31 DIAGNOSIS — Z8669 Personal history of other diseases of the nervous system and sense organs: Secondary | ICD-10-CM | POA: Insufficient documentation

## 2013-10-31 DIAGNOSIS — Y9389 Activity, other specified: Secondary | ICD-10-CM | POA: Insufficient documentation

## 2013-10-31 DIAGNOSIS — E785 Hyperlipidemia, unspecified: Secondary | ICD-10-CM | POA: Insufficient documentation

## 2013-10-31 DIAGNOSIS — E119 Type 2 diabetes mellitus without complications: Secondary | ICD-10-CM | POA: Insufficient documentation

## 2013-10-31 DIAGNOSIS — Z853 Personal history of malignant neoplasm of breast: Secondary | ICD-10-CM | POA: Insufficient documentation

## 2013-10-31 DIAGNOSIS — W108XXA Fall (on) (from) other stairs and steps, initial encounter: Secondary | ICD-10-CM | POA: Insufficient documentation

## 2013-10-31 DIAGNOSIS — Y92009 Unspecified place in unspecified non-institutional (private) residence as the place of occurrence of the external cause: Secondary | ICD-10-CM | POA: Insufficient documentation

## 2013-10-31 DIAGNOSIS — E079 Disorder of thyroid, unspecified: Secondary | ICD-10-CM | POA: Insufficient documentation

## 2013-10-31 DIAGNOSIS — Z8739 Personal history of other diseases of the musculoskeletal system and connective tissue: Secondary | ICD-10-CM | POA: Insufficient documentation

## 2013-10-31 DIAGNOSIS — Z8742 Personal history of other diseases of the female genital tract: Secondary | ICD-10-CM | POA: Insufficient documentation

## 2013-10-31 DIAGNOSIS — I10 Essential (primary) hypertension: Secondary | ICD-10-CM | POA: Insufficient documentation

## 2013-10-31 DIAGNOSIS — IMO0002 Reserved for concepts with insufficient information to code with codable children: Secondary | ICD-10-CM | POA: Insufficient documentation

## 2013-10-31 DIAGNOSIS — Z7982 Long term (current) use of aspirin: Secondary | ICD-10-CM | POA: Insufficient documentation

## 2013-10-31 DIAGNOSIS — E669 Obesity, unspecified: Secondary | ICD-10-CM | POA: Insufficient documentation

## 2013-10-31 DIAGNOSIS — Z79899 Other long term (current) drug therapy: Secondary | ICD-10-CM | POA: Insufficient documentation

## 2013-10-31 DIAGNOSIS — S82851A Displaced trimalleolar fracture of right lower leg, initial encounter for closed fracture: Secondary | ICD-10-CM

## 2013-10-31 DIAGNOSIS — S82853A Displaced trimalleolar fracture of unspecified lower leg, initial encounter for closed fracture: Secondary | ICD-10-CM | POA: Insufficient documentation

## 2013-10-31 DIAGNOSIS — W19XXXA Unspecified fall, initial encounter: Secondary | ICD-10-CM

## 2013-10-31 DIAGNOSIS — W010XXA Fall on same level from slipping, tripping and stumbling without subsequent striking against object, initial encounter: Secondary | ICD-10-CM | POA: Insufficient documentation

## 2013-10-31 MED ORDER — MORPHINE SULFATE 4 MG/ML IJ SOLN
4.0000 mg | Freq: Once | INTRAMUSCULAR | Status: AC
  Administered 2013-10-31: 4 mg via INTRAVENOUS
  Filled 2013-10-31: qty 1

## 2013-10-31 MED ORDER — MORPHINE SULFATE 2 MG/ML IJ SOLN
2.0000 mg | Freq: Once | INTRAMUSCULAR | Status: AC
  Administered 2013-10-31: 2 mg via INTRAVENOUS

## 2013-10-31 MED ORDER — MORPHINE SULFATE 2 MG/ML IJ SOLN
INTRAMUSCULAR | Status: AC
  Administered 2013-10-31: 2 mg via INTRAVENOUS
  Filled 2013-10-31: qty 1

## 2013-10-31 MED ORDER — OXYCODONE-ACETAMINOPHEN 5-325 MG PO TABS: ORAL_TABLET | ORAL | Status: DC

## 2013-10-31 MED ORDER — SODIUM CHLORIDE 0.9 % IV SOLN
INTRAVENOUS | Status: DC
  Administered 2013-10-31: 18:00:00 via INTRAVENOUS

## 2013-10-31 MED ORDER — MORPHINE SULFATE 4 MG/ML IJ SOLN
4.0000 mg | Freq: Once | INTRAMUSCULAR | Status: AC
Start: 2013-10-31 — End: 2013-10-31
  Administered 2013-10-31: 4 mg via INTRAVENOUS
  Filled 2013-10-31: qty 1

## 2013-10-31 MED ORDER — PROPOFOL 10 MG/ML IV BOLUS
INTRAVENOUS | Status: AC
  Administered 2013-10-31: 20 mg via INTRAVENOUS
  Filled 2013-10-31: qty 1

## 2013-10-31 MED ORDER — HYDROCODONE-ACETAMINOPHEN 5-325 MG PO TABS: ORAL_TABLET | ORAL | Status: DC

## 2013-10-31 MED ORDER — PROPOFOL 10 MG/ML IV BOLUS
200.0000 mg | Freq: Once | INTRAVENOUS | Status: AC
  Administered 2013-10-31: 50 mg via INTRAVENOUS
  Filled 2013-10-31: qty 1

## 2013-10-31 MED ORDER — ONDANSETRON HCL 4 MG/2ML IJ SOLN
INTRAMUSCULAR | Status: AC
  Administered 2013-10-31: 4 mg
  Filled 2013-10-31: qty 2

## 2013-10-31 MED ORDER — ONDANSETRON HCL 4 MG/2ML IJ SOLN
4.0000 mg | Freq: Once | INTRAMUSCULAR | Status: AC
  Administered 2013-10-31: 4 mg via INTRAMUSCULAR
  Filled 2013-10-31: qty 2

## 2013-10-31 MED ORDER — ONDANSETRON HCL 4 MG PO TABS: 4.0000 mg | ORAL_TABLET | Freq: Four times a day (QID) | ORAL | Status: DC

## 2013-10-31 NOTE — ED Notes (Signed)
Pt states that she was coming down steps and may have tripped, falling down 3 steps, pt c/o pain to left jaw bone, left hand and right ankle, swelling noted to right ankle, cms intact distal,

## 2013-10-31 NOTE — ED Notes (Signed)
Pedal pulse felt in right foot

## 2013-10-31 NOTE — Discharge Instructions (Signed)
Elevate your foot. Use ice packs to get the swelling down. Leave the splint on until you are rechecked by Dr Doran Durand, call his office and tell them you have a "trimalleolar fracture" of your right ankle and we spoke to Dr Theda Sers today and he said you should see Dr Doran Durand to discuss having surgery to fix your ankle. Return to the ED for any problems listed on the head injury sheet.

## 2013-10-31 NOTE — ED Provider Notes (Signed)
CSN: [id number]     Arrival date & time 10/31/13  1554 History  This chart was scribed for [name], MD by Eston Mould, ED Scribe. This patient was seen in room APA17/APA17 and the patient's care was started at 5:13 PM.  Chief Complaint  Patient presents with  . Fall   Patient is a 68 y.o. female presenting with fall. The history is provided by the patient. No language interpreter was used.  Fall   HPI Comments: Alexis Singh is a 68 y.o. female who presents to the Emergency Department complaining of fall that occurred 2 hours ago. Pt states she is "unsure" what may have happened and states all she recalls is "getting up". Pt states she has 4 steps stairs outside her house where she fell. She states she was unable to walk after the fall and states she was picked up by husband and brought to the ED. Pt states her main source of pain is her R ankle. States when she fell she put out her left hand and it hit on the MCP of her LIF but she has no pain there now. Her left jaw and her anterior left chest then hit the driveway, but she denies pain there now. Pt denies LOC, hitting head, CP, abd pain, chin pain, and numbness and tingling of extremities. Pt denies nausea, vomiting, diarrhea or blurred vision.  Pt states she has gone to [facility] in the past and was treated by Dr. Beola Cord for surgery to her toes.  PCP Dr Susann Givens   Past Medical History  Diagnosis Date  . Glaucoma   . Obesity   . Hyperlipidemia   . Thyroid disease     overactive thyroid  . Diabetes mellitus   . Cancer 1994    left breast  . Osteoporosis   . Pituitary tumor   . Hypertension   . Fibroid    Past Surgical History  Procedure Laterality Date  . Breast lumpectomy      with removal of lymph nodes/had chemo and radiation  . Pituitary tumor removal  [place]  . Tubal ligation    . Abdominal hysterectomy      TAH BSO  . Toe surgery    . Cholecystectomy    . Eye surgery       Laser  . Oophorectomy      BSO  . Wrist fracture surgery    . Vein procedure     Family History  Problem Relation Age of Onset  . Breast cancer Mother     Age 45  . Hypertension Mother   . Heart failure Father   . Hypertension Sister   . Diabetes Brother   . Hypertension Brother   . Cancer Brother     Unsure what type   History  Substance Use Topics  . Smoking status: Never Smoker   . Smokeless tobacco: Not on file  . Alcohol Use: No   Retired Lives at home Lives with spouse  OB History   Grav Para Term Preterm Abortions TAB SAB Ect Mult Living   2 1 1  1     1      Review of Systems  Musculoskeletal: Positive for gait problem.  All other systems reviewed and are negative.    Allergies  Ambien; Ciprofloxacin hcl; and Codeine  Home Medications   Current Outpatient Rx  Name  Route  Sig  Dispense  Refill  . aspirin EC 81 MG tablet  Oral   Take 81 mg by mouth daily.         Marland Kitchen atorvastatin (LIPITOR) 40 MG tablet   Oral   Take 40 mg by mouth daily.         . brimonidine (ALPHAGAN P) 0.1 % SOLN   Both Eyes   Place 1 drop into both eyes 2 (two) times daily.          . dorzolamide-timolol (COSOPT) 22.3-6.8 MG/ML ophthalmic solution   Both Eyes   Place 1 drop into both eyes 2 (two) times daily.           Marland Kitchen glipiZIDE (GLUCOTROL XL) 2.5 MG 24 hr tablet   Oral   Take 2.5 mg by mouth daily.         . hydrocortisone (CORTEF) 10 MG tablet   Oral   Take 5-15 mg by mouth daily. Patient takes 15mg  in the morning and 5mg  in the afternoon         . latanoprost (XALATAN) 0.005 % ophthalmic solution   Both Eyes   Place 1 drop into both eyes at bedtime.         Marland Kitchen levothyroxine (SYNTHROID, LEVOTHROID) 88 MCG tablet   Oral   Take 88 mcg by mouth daily before breakfast.         . losartan (COZAAR) 100 MG tablet   Oral   Take 100 mg by mouth daily.            Triage Vitals:BP 209/96  Pulse 85  Temp(Src) 98 F (36.7 C) (Oral)  Resp 20  Ht  5\' 4"  (1.626 m)  Wt 178 lb (80.74 kg)  BMI 30.54 kg/m2  SpO2 100%  Vital signs normal except hypertension   Physical Exam  Nursing note and vitals reviewed. Constitutional: She is oriented to person, place, and time. She appears well-developed and well-nourished.  Non-toxic appearance. She does not appear ill. No distress.  HENT:  Head: Normocephalic and atraumatic.  Right Ear: External ear normal.  Left Ear: External ear normal.  Nose: Nose normal. No mucosal edema or rhinorrhea.  Mouth/Throat: Oropharynx is clear and moist and mucous membranes are normal. No dental abscesses or uvula swelling.  Nontender mandible. States she hit the left side when she fell, no abrasions, swelling, bruising  Eyes: Conjunctivae and EOM are normal. Pupils are equal, round, and reactive to light.  Neck: Normal range of motion and full passive range of motion without pain. Neck supple.  Cardiovascular: Normal rate, regular rhythm and normal heart sounds.  Exam reveals no gallop and no friction rub.   No murmur heard. Pulmonary/Chest: Effort normal and breath sounds normal. No respiratory distress. She has no wheezes. She has no rhonchi. She has no rales. She exhibits no tenderness and no crepitus.  Chest nontender.  Abdominal: Soft. Normal appearance and bowel sounds are normal. She exhibits no distension. There is no tenderness. There is no rebound and no guarding.  Musculoskeletal: Normal range of motion. She exhibits edema and tenderness.  Swelling of R ankle with mild deformity. Moves all extremities well. Good bounding dorsalis pedis pulses bilaterally, intact sensation to toes. Small epidermal abrasion to the anterior right knee, no effusion or pain in the knee to palpation.   Neurological: She is alert and oriented to person, place, and time. She has normal strength. No cranial nerve deficit.  Intact sensation. Bounding dorsalis pedis pulses.  Skin: Skin is warm, dry and intact. No rash noted. No  erythema. No pallor.  Psychiatric: She has a normal mood and affect. Her speech is normal and behavior is normal. Her mood appears not anxious.    ED Course  Procedures  Medications  0.9 %  sodium chloride infusion (0 mLs Intravenous Stopped 10/31/13 2100)  ondansetron (ZOFRAN) injection 4 mg (4 mg Intramuscular Given 10/31/13 1757)  morphine 4 MG/ML injection 4 mg (4 mg Intravenous Given 10/31/13 1758)  propofol (DIPRIVAN) 10 mg/mL bolus/IV push 200 mg (0 mg Intravenous Stopped 10/31/13 1957)  morphine 4 MG/ML injection 4 mg (4 mg Intravenous Given 10/31/13 1951)  propofol (DIPRIVAN) 10 mg/mL bolus/IV push (  Stopped 10/31/13 1959)  morphine 2 MG/ML injection 2 mg (2 mg Intravenous Given 10/31/13 2010)  ondansetron (ZOFRAN) 4 MG/2ML injection (4 mg  Given 10/31/13 2044)    DIAGNOSTIC STUDIES: Oxygen Saturation is 100% on RA, normal by my interpretation.    COORDINATION OF CARE: 5:16 PM-Discussed treatment plan which includes discuss radiology findings with patient. Will call Ortho for further evaluation. Pt agreed to plan.   18:09 Dr Theda Sers, states to reduce, splint and call the office on Monday to see Dr Doran Durand.   19:20 Dr Theda Sers called back, has reviewed her xrays, gave tips to reduce her ankle.   8:49 PM-Informed pt of post reduction radiology results. Advised pt surgery will still be necessary. Pt agreed to plan. Pt is back to her baseline.   Pt was given script for percocet but she states it makes her nauseated, so she was given hydrocodone.   Procedural sedation Performed by: Rolland Porter L Consent: Verbal consent obtained. Risks and benefits: risks, benefits and alternatives were discussed Required items: required blood products, implants, devices, and special equipment available Patient identity confirmed: arm band and provided demographic data Time out: Immediately prior to procedure a "time out" was called to verify the correct patient, procedure, equipment, support staff and  site/side marked as required.  Sedation type: moderate (conscious) sedation after 50 mg of propofol  NPO time confirmed and considedered Sedation given 19:56, pt arousable at 20:12 Sedatives: PROPOFOL attempted reduction by myself Posterior/stirrup splint placed by nursing staff  Physician Time at Bedside: 22 min  Vitals: Vital signs were monitored during sedation. Cardiac Monitor, pulse oximeter Patient tolerance: Patient tolerated the procedure well with no immediate complications. Comments: Pt with uneventful recovered. Returned to pre-procedural sedation baseline    Labs Review Labs Reviewed - No data to display Imaging Review Dg Mandible 4 Views  10/31/2013   CLINICAL DATA:  [place].  Left jaw pain.  EXAM: MANDIBLE - 4+ VIEW  COMPARISON:  None.  FINDINGS: No acute fractures involving the mandible. Temporomandibular joints intact. No intrinsic osseous abnormality. No visible dental disease.  IMPRESSION: Normal examination.   Electronically Signed   By: Evangeline Dakin M.D.   On: 10/31/2013 17:03   Dg Ankle Complete Right  10/31/2013   CLINICAL DATA:  [place] and injured right ankle.  EXAM: RIGHT ANKLE - COMPLETE 3+ VIEW  COMPARISON:  None.  FINDINGS: Displaced oblique fracture involving the distal fibula. Transverse fracture involving the medial malleolus. Fracture involving the posterior malleolus of the tibia. Lateral and posterior displacement of the talus relative to the tibia. Diffuse soft tissue swelling. Large joint effusion/hemarthrosis.  IMPRESSION: Trimalleolar fracture with posterior and lateral subluxation of the talus relative to the tibia.   Electronically Signed   By: Evangeline Dakin M.D.   On: 10/31/2013 17:07   Dg Hand Complete Left  10/31/2013   CLINICAL DATA:  [place] and injured left  hand.  EXAM: LEFT HAND - COMPLETE 3+ VIEW  COMPARISON:  None.  FINDINGS: No evidence of acute fracture or dislocation. Mild osseous demineralization. Mild to moderate joint space narrowing  involving IP joints of multiple fingers, the first and 5th MTP joints, and several carpal-metacarpal joints.  IMPRESSION: No acute osseous abnormality.  Mild to moderate osteoarthritis.   Electronically Signed   By: Evangeline Dakin M.D.   On: 10/31/2013 17:05    EKG Interpretation   None       MDM   1. Fall at home   2. Trimalleolar fracture of right ankle      Discharge Medication List as of 10/31/2013  8:45 PM    START taking these medications   Details  !! HYDROcodone-acetaminophen (NORCO/VICODIN) 5-325 MG per tablet Take 1 or 2 po Q 6hrs for pain, Print prepack # 6    !! HYDROcodone-acetaminophen (NORCO/VICODIN) 5-325 MG per tablet Take 1 or 2 po Q 6hrs for pain, Print    ondansetron (ZOFRAN) 4 MG tablet Take 1 tablet (4 mg total) by mouth every 6 (six) hours., Starting 10/31/2013, Until Discontinued, Print    oxyCODONE-acetaminophen (PERCOCET/ROXICET) 5-325 MG per tablet Take 1 or 2 po Q 6hrs for pain, Print  VOIDED B/O Nausea per patient     !! - Potential duplicate medications found. Please discuss with provider.      Plan discharge   Rolland Porter, MD, FACEP   I personally performed the services described in this documentation, which was scribed in my presence. The recorded information has been reviewed and considered.  Rolland Porter, MD, Abram Sander    [name], MD 10/31/13 [phone]

## 2013-10-31 NOTE — ED Notes (Signed)
Family at bedside. Patient assisted to bedside commode with ankle still elevated on the bed. BP is elevated.

## 2013-11-05 ENCOUNTER — Encounter (HOSPITAL_COMMUNITY): Payer: Self-pay | Admitting: Pharmacy Technician

## 2013-11-09 NOTE — Pre-Procedure Instructions (Addendum)
Alexis Singh  11/09/2013   Your procedure is scheduled on:     Thursday  11/12/13   Report to [facility]  2 * 3 at 200 pM.  Call this number if you have problems the morning of surgery: [phone]   Remember:   Do not eat food or drink liquids after midnight.   Take these medicines the morning of surgery with A SIP OF WATER: eye drops, hydrocodone, levothyroxine  Stop taking Asprin, Aleve, Fish Oil, Herbal Medications, BC's Ibuprofen, Goody's   Do not wear jewelry, make-up or nail polish.  Do not wear lotions, powders, or perfumes. You may wear deodorant.  Do not shave 48 hours prior to surgery. Men may shave face and neck.  Do not bring valuables to the hospital.  [facility] is not responsible                  for any belongings or valuables.               Contacts, dentures or bridgework may not be worn into surgery.  Leave suitcase in the car. After surgery it may be brought to your room.  For patients admitted to the hospital, discharge time is determined by your                treatment team.               Patients discharged the day of surgery will not be allowed to drive  home.    Special Instructions:  Special Instructions: [facility] - Preparing for Surgery  Before surgery, you can play an important role.  Because skin is not sterile, your skin needs to be as free of germs as possible.  You can reduce the number of germs on you skin by washing with CHG (chlorahexidine gluconate) soap before surgery.  CHG is an antiseptic cleaner which kills germs and bonds with the skin to continue killing germs even after washing.  Please DO NOT use if you have an allergy to CHG or antibacterial soaps.  If your skin becomes reddened/irritated stop using the CHG and inform your nurse when you arrive at Short Stay.  Do not shave (including legs and underarms) for at least 48 hours prior to the first CHG shower.  You may shave your face.  Please follow these  instructions carefully:   1.  Shower with CHG Soap the night before surgery and the morning of Surgery.  2.  If you choose to wash your hair, wash your hair first as usual with your normal shampoo.  3.  After you shampoo, rinse your hair and body thoroughly to remove the Shampoo.  4.  Use CHG as you would any other liquid soap. You can apply chg directly to the skin and wash gently with scrungie or a clean washcloth.  5.  Apply the CHG Soap to your body ONLY FROM THE NECK DOWN.  Do not use on open wounds or open sores.  Avoid contact with your eyes, ears, mouth and genitals (private parts).  Wash genitals (private parts with your normal soap.  6.  Wash thoroughly, paying special attention to the area where your surgery will be performed.  7.  Thoroughly rinse your body with warm water from the neck down.  8.  DO NOT shower/wash with your normal soap after using and rinsing off the CHG Soap.  9.  Pat yourself dry with a clean towel.  10.  Wear clean pajamas.            11.  Place clean sheets on your bed the night of your first shower and do not sleep with pets.  Day of Surgery  Do not apply any lotions/deodorants the morning of surgery.  Please wear clean clothes to the hospital/surgery center.   Please read over the following fact sheets that you were given: Pain Booklet, Coughing and Deep Breathing and Surgical Site Infection Prevention

## 2013-11-10 ENCOUNTER — Ambulatory Visit (HOSPITAL_COMMUNITY)
Admission: RE | Admit: 2013-11-10 | Discharge: 2013-11-10 | Disposition: A | Discharge status: Home / Self Care | Payer: Medicare HMO | Source: Ambulatory Visit | Attending: Anesthesiology | Admitting: Anesthesiology

## 2013-11-10 ENCOUNTER — Encounter (HOSPITAL_COMMUNITY)
Admission: RE | Admit: 2013-11-10 | Discharge: 2013-11-10 | Disposition: A | Discharge status: Home / Self Care | Payer: Medicare HMO | Source: Ambulatory Visit | Attending: Orthopedic Surgery | Admitting: Orthopedic Surgery

## 2013-11-10 ENCOUNTER — Encounter (HOSPITAL_COMMUNITY): Payer: Self-pay

## 2013-11-10 DIAGNOSIS — Z01818 Encounter for other preprocedural examination: Secondary | ICD-10-CM | POA: Insufficient documentation

## 2013-11-10 DIAGNOSIS — Z01812 Encounter for preprocedural laboratory examination: Secondary | ICD-10-CM | POA: Insufficient documentation

## 2013-11-10 HISTORY — DX: Shortness of breath: R06.02

## 2013-11-10 HISTORY — DX: Unspecified osteoarthritis, unspecified site: M19.90

## 2013-11-10 HISTORY — DX: Headache: R51

## 2013-11-10 HISTORY — DX: Other specified postprocedural states: Z98.890

## 2013-11-10 HISTORY — DX: Gastro-esophageal reflux disease without esophagitis: K21.9

## 2013-11-10 HISTORY — DX: Anemia, unspecified: D64.9

## 2013-11-10 HISTORY — DX: Nausea with vomiting, unspecified: R11.2

## 2013-11-10 HISTORY — DX: Hematuria, unspecified: R31.9

## 2013-11-10 HISTORY — DX: Thyrotoxicosis, unspecified without thyrotoxic crisis or storm: E05.90

## 2013-11-10 LAB — BASIC METABOLIC PANEL
BUN: 15 mg/dL (ref 6–23)
CO2: 28 mEq/L (ref 19–32)
Calcium: 9.5 mg/dL (ref 8.4–10.5)
Chloride: 101 mEq/L (ref 96–112)
Creatinine, Ser: 1.19 mg/dL — ABNORMAL HIGH (ref 0.50–1.10)
GFR calc non Af Amer: 46 mL/min — ABNORMAL LOW (ref >90)
GFR, EST AFRICAN AMERICAN: 54 mL/min — AB (ref >90)
Glucose, Bld: 193 mg/dL — ABNORMAL HIGH (ref 70–99)
POTASSIUM: 4.9 meq/L (ref 3.7–5.3)
Sodium: 139 mEq/L (ref 137–147)

## 2013-11-10 LAB — CBC
HCT: 29.5 % — ABNORMAL LOW (ref 36.0–46.0)
Hemoglobin: 10.1 g/dL — ABNORMAL LOW (ref 12.0–15.0)
MCH: 29.8 pg (ref 26.0–34.0)
MCHC: 34.2 g/dL (ref 30.0–36.0)
MCV: 87 fL (ref 78.0–100.0)
Platelets: 337 10*3/uL (ref 150–400)
RBC: 3.39 MIL/uL — ABNORMAL LOW (ref 3.87–5.11)
RDW: 12.1 % (ref 11.5–15.5)
WBC: 6.6 10*3/uL (ref 4.0–10.5)

## 2013-11-10 NOTE — Progress Notes (Signed)
Anesthesia Chart Review:  Patient is a 68 year old female scheduled for ORIF of right ankle fracture on 11/12/13 by Dr. Doran Durand. She fell on 10/31/13.   History includes non-smoker, HLD, DM2, GERD, anemia, "hyperthyroidism" is listed but she is actually on levothyroxine for hypothyroidism, pituitary tumor excision, HTN, glaucoma, left breast cancer s/p lumpectomy and chemoradiation '94, arthritis, headaches, post-operative N/V. She is not followed by cardiology, but was evaluated by cardiologist Dr. Fletcher Anon in April 2012 for chest pain and inferior infarct on EKG.  She had non-ischemic stress test at that time.  PCP is Dr. Luan Pulling.  EKG on 10/02/13 showed NSR, inferior infarct (cited on or before 05/20/09), anterior infarct (age undetermined)--Q wave in V3 is present when compared with EKG on 12/05/12, but has had reverse r wave progression in V2-3 in the past.      Nuclear stress test on 01/23/11 [facility]) showed normal LV perfusion, no ECG changes consistent with ischemia, normal global LV systolic function, EF 11%, normal wall motion, no definite perfusion defects.  Echo on 05/23/09 showed: 1. Left ventricle: The cavity size was normal. Systolic function was normal. The estimated ejection fraction was in the range of 60% to 65%. Wall motion was normal; there were no regional wall motion abnormalities. 2. Pulmonary arteries: PA peak pressure: 54mm Hg (S). 3. Trivial mitral and tricuspid valvular regurgitation.  Preoperative CXR and labs from 11/10/13 noted.  No CV symptoms documented at her PAT visit. She had a normal stress test within the past three years. Clinical correlation on the day of surgery.  Could consider repeating EKG as well, but will defer to the evaluating anesthesiologist. If she remains asymptomatic from a CV standpoint then I would anticipate that she could proceed as planned .    George [facility] Short Stay Center/Anesthesiology Phone [phone] 11/10/2013 5:50 PM

## 2013-11-10 NOTE — Progress Notes (Signed)
PCP is Dr Luan Pulling Denies seeing a cardiologist Stress test noted in epic 12-2010 Denies having a recent CXR EKG noted 10-02-2013

## 2013-11-10 NOTE — Progress Notes (Signed)
Dr Hewitt's office called for orders. Spoke with Candy.

## 2013-11-11 ENCOUNTER — Other Ambulatory Visit: Payer: Self-pay | Admitting: Orthopedic Surgery

## 2013-11-11 MED ORDER — CHLORHEXIDINE GLUCONATE 4 % EX LIQD
60.0000 mL | Freq: Once | CUTANEOUS | Status: DC
  Filled 2013-11-11: qty 60

## 2013-11-11 MED ORDER — CEFAZOLIN SODIUM-DEXTROSE 2-3 GM-% IV SOLR
2.0000 g | INTRAVENOUS | Status: AC
  Administered 2013-11-12: 2 g via INTRAVENOUS
  Filled 2013-11-11: qty 50

## 2013-11-11 MED ORDER — ACETAMINOPHEN 500 MG PO TABS
1000.0000 mg | ORAL_TABLET | Freq: Once | ORAL | Status: AC
  Administered 2013-11-12: 1000 mg via ORAL
  Filled 2013-11-11: qty 2

## 2013-11-11 MED ORDER — SODIUM CHLORIDE 0.9 % IV SOLN: INTRAVENOUS | Status: DC

## 2013-11-11 NOTE — Progress Notes (Signed)
Patient instructed to arrive at 1515 instead of 1400. Patient verbalized understanding.

## 2013-11-11 NOTE — H&P (Signed)
Alexis Singh is an 68 y.o. female.   Chief Complaint: Right ankle fracture  HPI: Pt reports to OR for surgical repair of right trimalleolar ankle fracture which occurred 10-31-2013.  Pt fell coming down stairs in her home.  Pt denies N/V/F/C, chest pain, SOB, or paresthesia bilaterally.  Past Medical History  Diagnosis Date  . Glaucoma   . Obesity   . Hyperlipidemia   . Thyroid disease     overactive thyroid  . Diabetes mellitus   . Cancer 1994    left breast  . Osteoporosis   . Pituitary tumor   . Hypertension   . Fibroid   . PONV (postoperative nausea and vomiting)   . Shortness of breath   . Blood in urine   . GERD (gastroesophageal reflux disease)   . Headache(784.0)   . Arthritis   . Anemia   . Hyperthyroidism     Past Surgical History  Procedure Laterality Date  . Breast lumpectomy      with removal of lymph nodes/had chemo and radiation  . Pituitary tumor removal  [place]  . Tubal ligation    . Abdominal hysterectomy      TAH BSO  . Toe surgery    . Cholecystectomy    . Eye surgery      Laser  . Oophorectomy      BSO  . Wrist fracture surgery    . Vein procedure    . Left wrist surgery      Family History  Problem Relation Age of Onset  . Breast cancer Mother     Age 36  . Hypertension Mother   . Heart failure Father   . Hypertension Sister   . Diabetes Brother   . Hypertension Brother   . Cancer Brother     Unsure what type   Social History:  reports that she has never smoked. She does not have any smokeless tobacco history on file. She reports that she does not drink alcohol or use illicit drugs.  Allergies:  Allergies  Allergen Reactions  . Ambien [Zolpidem Tartrate] Nausea And Vomiting  . Ciprofloxacin Hcl Rash  . Codeine Nausea And Vomiting    No prescriptions prior to admission    Results for orders placed during the hospital encounter of 11/10/13 (from the past 48 hour(s))  BASIC METABOLIC PANEL     Status: Abnormal   Collection Time    11/10/13  1:48 PM      Result Value Ref Range   Sodium 139  137 - 147 mEq/L   Potassium 4.9  3.7 - 5.3 mEq/L   Chloride 101  96 - 112 mEq/L   CO2 28  19 - 32 mEq/L   Glucose, Bld 193 (*) 70 - 99 mg/dL   BUN 15  6 - 23 mg/dL   Creatinine, Ser 1.19 (*) 0.50 - 1.10 mg/dL   Calcium 9.5  8.4 - 10.5 mg/dL   GFR calc non Af Amer 46 (*) >90 mL/min   GFR calc Af Amer 54 (*) >90 mL/min   Comment: (NOTE)     The eGFR has been calculated using the CKD EPI equation.     This calculation has not been validated in all clinical situations.     eGFR's persistently <90 mL/min signify possible Chronic Kidney     Disease.  CBC     Status: Abnormal   Collection Time    11/10/13  1:48 PM      Result  Value Ref Range   WBC 6.6  4.0 - 10.5 K/uL   RBC 3.39 (*) 3.87 - 5.11 MIL/uL   Hemoglobin 10.1 (*) 12.0 - 15.0 g/dL   HCT 29.5 (*) 36.0 - 46.0 %   MCV 87.0  78.0 - 100.0 fL   MCH 29.8  26.0 - 34.0 pg   MCHC 34.2  30.0 - 36.0 g/dL   RDW 12.1  11.5 - 15.5 %   Platelets 337  150 - 400 K/uL   Dg Chest 2 View  11/10/2013   CLINICAL DATA:  Preoperative evaluation for ankle surgery  EXAM: CHEST  2 VIEW  COMPARISON:  05/21/2009  FINDINGS: Postsurgical changes are noted on the left in the axilla. The cardiac shadow is within normal limits. The lungs are clear. No acute bony abnormality is seen.  IMPRESSION: No active cardiopulmonary disease.   Electronically Signed   By: Inez Catalina M.D.   On: 11/10/2013 14:47    Review of Systems  Constitutional: Negative.   HENT: Negative.   Eyes: Negative.   Respiratory: Negative.   Cardiovascular: Negative.   Gastrointestinal: Negative.   Musculoskeletal: Negative.   Skin: Negative.   Neurological: Negative for tremors.  Endo/Heme/Allergies: Negative.   Psychiatric/Behavioral: The patient is not nervous/anxious.     There were no vitals taken for this visit. Physical Exam  WD WN 68 y/o female in NAD, A/Ox3, appears stated age.  EOMI, mood and  affect normal, respirations unlabored. Pt presents in posterior mold splint.  Splint removed RLE examined.  2+ edema to right ankle circumferentially with positive TTP to medial and lateral malleoli.  Normal sensation to light touch intact. DP pulses 2+ bilaterally.  Distal toes well perfused with cap refill <2sec.  Assessment/Plan Pt reports to OR for surgical repair of right ankle trimalleolar fracture.    FLOWERS, CHRISTOPHER S 11/11/2013, 5:33 PM  Agree with note above.  To OR for ORIF of right ankle trimalleolar fracture.  The risks and benefits of the alternative treatment options have been discussed in detail.  The patient wishes to proceed with surgery and specifically understands risks of bleeding, infection, nerve damage, blood clots, need for additional surgery, amputation and death.

## 2013-11-12 ENCOUNTER — Encounter (HOSPITAL_COMMUNITY)
Admission: RE | Disposition: A | Discharge status: Home Health | Payer: Self-pay | Source: Ambulatory Visit | Attending: Orthopedic Surgery

## 2013-11-12 ENCOUNTER — Ambulatory Visit (HOSPITAL_COMMUNITY): Payer: Medicare HMO | Admitting: Anesthesiology

## 2013-11-12 ENCOUNTER — Encounter (HOSPITAL_COMMUNITY): Payer: Medicare HMO | Admitting: Vascular Surgery

## 2013-11-12 ENCOUNTER — Encounter (HOSPITAL_COMMUNITY): Payer: Self-pay | Admitting: Surgery

## 2013-11-12 ENCOUNTER — Ambulatory Visit (HOSPITAL_COMMUNITY): Payer: Medicare HMO

## 2013-11-12 ENCOUNTER — Inpatient Hospital Stay (HOSPITAL_COMMUNITY)
Admission: RE | Admit: 2013-11-12 | Discharge: 2013-11-13 | DRG: 494 | Disposition: A | Discharge status: Home Health | Payer: Medicare HMO | Source: Ambulatory Visit | Attending: Orthopedic Surgery | Admitting: Orthopedic Surgery

## 2013-11-12 DIAGNOSIS — K219 Gastro-esophageal reflux disease without esophagitis: Secondary | ICD-10-CM | POA: Diagnosis present

## 2013-11-12 DIAGNOSIS — E669 Obesity, unspecified: Secondary | ICD-10-CM | POA: Diagnosis present

## 2013-11-12 DIAGNOSIS — H409 Unspecified glaucoma: Secondary | ICD-10-CM | POA: Diagnosis present

## 2013-11-12 DIAGNOSIS — D649 Anemia, unspecified: Secondary | ICD-10-CM | POA: Diagnosis present

## 2013-11-12 DIAGNOSIS — S82853A Displaced trimalleolar fracture of unspecified lower leg, initial encounter for closed fracture: Principal | ICD-10-CM | POA: Diagnosis present

## 2013-11-12 DIAGNOSIS — E0590 Thyrotoxicosis, unspecified without thyrotoxic crisis or storm: Secondary | ICD-10-CM | POA: Diagnosis present

## 2013-11-12 DIAGNOSIS — S82851A Displaced trimalleolar fracture of right lower leg, initial encounter for closed fracture: Secondary | ICD-10-CM | POA: Diagnosis present

## 2013-11-12 DIAGNOSIS — W108XXA Fall (on) (from) other stairs and steps, initial encounter: Secondary | ICD-10-CM | POA: Diagnosis present

## 2013-11-12 DIAGNOSIS — I10 Essential (primary) hypertension: Secondary | ICD-10-CM | POA: Diagnosis present

## 2013-11-12 DIAGNOSIS — E785 Hyperlipidemia, unspecified: Secondary | ICD-10-CM | POA: Diagnosis present

## 2013-11-12 DIAGNOSIS — E119 Type 2 diabetes mellitus without complications: Secondary | ICD-10-CM | POA: Diagnosis present

## 2013-11-12 DIAGNOSIS — M129 Arthropathy, unspecified: Secondary | ICD-10-CM | POA: Diagnosis present

## 2013-11-12 DIAGNOSIS — M810 Age-related osteoporosis without current pathological fracture: Secondary | ICD-10-CM | POA: Diagnosis present

## 2013-11-12 HISTORY — PX: ORIF ANKLE FRACTURE: SHX5408

## 2013-11-12 LAB — GLUCOSE, CAPILLARY
GLUCOSE-CAPILLARY: 140 mg/dL — AB (ref 70–99)
Glucose-Capillary: 148 mg/dL — ABNORMAL HIGH (ref 70–99)
Glucose-Capillary: 114 mg/dL — ABNORMAL HIGH (ref 70–99)

## 2013-11-12 SURGERY — OPEN REDUCTION INTERNAL FIXATION (ORIF) ANKLE FRACTURE
Anesthesia: General | Site: Ankle | Laterality: Right

## 2013-11-12 MED ORDER — LACTATED RINGERS IV SOLN
INTRAVENOUS | Status: DC
  Administered 2013-11-12: 16:00:00 via INTRAVENOUS

## 2013-11-12 MED ORDER — HYDROMORPHONE HCL PF 1 MG/ML IJ SOLN
INTRAMUSCULAR | Status: AC
  Filled 2013-11-12: qty 1

## 2013-11-12 MED ORDER — ONDANSETRON HCL 4 MG/2ML IJ SOLN
INTRAMUSCULAR | Status: DC | PRN
  Administered 2013-11-12: 4 mg via INTRAVENOUS

## 2013-11-12 MED ORDER — MIDAZOLAM HCL 2 MG/2ML IJ SOLN
INTRAMUSCULAR | Status: AC
  Administered 2013-11-12: 1 mg via INTRAVENOUS
  Filled 2013-11-12: qty 2

## 2013-11-12 MED ORDER — NEOSTIGMINE METHYLSULFATE 1 MG/ML IJ SOLN
INTRAMUSCULAR | Status: AC
Start: 2013-11-12 — End: 2013-11-12
  Filled 2013-11-12: qty 10

## 2013-11-12 MED ORDER — STERILE WATER FOR INJECTION IJ SOLN
INTRAMUSCULAR | Status: AC
  Filled 2013-11-12: qty 20

## 2013-11-12 MED ORDER — MIDAZOLAM HCL 2 MG/2ML IJ SOLN
1.0000 mg | INTRAMUSCULAR | Status: DC | PRN
  Administered 2013-11-12: 1 mg via INTRAVENOUS

## 2013-11-12 MED ORDER — HYDROCORTISONE 5 MG PO TABS
5.0000 mg | ORAL_TABLET | Freq: Every day | ORAL | Status: DC
  Filled 2013-11-12: qty 1

## 2013-11-12 MED ORDER — ROCURONIUM BROMIDE 50 MG/5ML IV SOLN
INTRAVENOUS | Status: AC
  Filled 2013-11-12: qty 1

## 2013-11-12 MED ORDER — LIDOCAINE HCL (CARDIAC) 20 MG/ML IV SOLN
INTRAVENOUS | Status: DC | PRN
  Administered 2013-11-12: 50 mg via INTRAVENOUS

## 2013-11-12 MED ORDER — HYDROCORTISONE 5 MG PO TABS: 5.0000 mg | ORAL_TABLET | Freq: Every day | ORAL | Status: DC

## 2013-11-12 MED ORDER — FENTANYL CITRATE 0.05 MG/ML IJ SOLN
50.0000 ug | Freq: Once | INTRAMUSCULAR | Status: AC
  Administered 2013-11-12: 50 ug via INTRAVENOUS

## 2013-11-12 MED ORDER — ONDANSETRON HCL 4 MG/2ML IJ SOLN
INTRAMUSCULAR | Status: AC
  Filled 2013-11-12: qty 2

## 2013-11-12 MED ORDER — INSULIN ASPART 100 UNIT/ML ~~LOC~~ SOLN
0.0000 [IU] | Freq: Three times a day (TID) | SUBCUTANEOUS | Status: DC
  Administered 2013-11-13: 3 [IU] via SUBCUTANEOUS

## 2013-11-12 MED ORDER — SODIUM CHLORIDE 0.9 % IV SOLN
INTRAVENOUS | Status: DC
  Administered 2013-11-12: 21:00:00 via INTRAVENOUS

## 2013-11-12 MED ORDER — FENTANYL CITRATE 0.05 MG/ML IJ SOLN
INTRAMUSCULAR | Status: DC | PRN
  Administered 2013-11-12: 100 ug via INTRAVENOUS
  Administered 2013-11-12: 50 ug via INTRAVENOUS

## 2013-11-12 MED ORDER — FENTANYL CITRATE 0.05 MG/ML IJ SOLN
INTRAMUSCULAR | Status: AC
  Administered 2013-11-12: 50 ug via INTRAVENOUS
  Filled 2013-11-12: qty 2

## 2013-11-12 MED ORDER — SENNA 8.6 MG PO TABS
1.0000 | ORAL_TABLET | Freq: Two times a day (BID) | ORAL | Status: DC
  Administered 2013-11-12 – 2013-11-13 (×2): 8.6 mg via ORAL
  Filled 2013-11-12 (×3): qty 1

## 2013-11-12 MED ORDER — HYDROCORTISONE 10 MG PO TABS
15.0000 mg | ORAL_TABLET | Freq: Every day | ORAL | Status: DC
  Administered 2013-11-13: 15 mg via ORAL
  Filled 2013-11-12: qty 1

## 2013-11-12 MED ORDER — HYDROMORPHONE HCL PF 1 MG/ML IJ SOLN
0.5000 mg | INTRAMUSCULAR | Status: DC | PRN
  Administered 2013-11-12 – 2013-11-13 (×3): 1 mg via INTRAVENOUS
  Filled 2013-11-12 (×3): qty 1

## 2013-11-12 MED ORDER — OXYCODONE HCL 5 MG PO TABS
ORAL_TABLET | ORAL | Status: AC
  Filled 2013-11-12: qty 1

## 2013-11-12 MED ORDER — GLYCOPYRROLATE 0.2 MG/ML IJ SOLN
INTRAMUSCULAR | Status: DC | PRN
  Administered 2013-11-12: 0.6 mg via INTRAVENOUS

## 2013-11-12 MED ORDER — ONDANSETRON HCL 4 MG PO TABS: 4.0000 mg | ORAL_TABLET | Freq: Four times a day (QID) | ORAL | Status: DC | PRN

## 2013-11-12 MED ORDER — PHENYLEPHRINE HCL 10 MG/ML IJ SOLN
INTRAMUSCULAR | Status: DC | PRN
  Administered 2013-11-12: 80 ug via INTRAVENOUS

## 2013-11-12 MED ORDER — LEVOTHYROXINE SODIUM 88 MCG PO TABS
88.0000 ug | ORAL_TABLET | Freq: Every day | ORAL | Status: DC
  Administered 2013-11-13: 88 ug via ORAL
  Filled 2013-11-12 (×2): qty 1

## 2013-11-12 MED ORDER — LATANOPROST 0.005 % OP SOLN
1.0000 [drp] | Freq: Every day | OPHTHALMIC | Status: DC
  Administered 2013-11-12: 1 [drp] via OPHTHALMIC
  Filled 2013-11-12: qty 2.5

## 2013-11-12 MED ORDER — HYDROMORPHONE HCL PF 1 MG/ML IJ SOLN: 0.2500 mg | INTRAMUSCULAR | Status: DC | PRN

## 2013-11-12 MED ORDER — LIDOCAINE HCL (CARDIAC) 20 MG/ML IV SOLN
INTRAVENOUS | Status: AC
  Filled 2013-11-12: qty 5

## 2013-11-12 MED ORDER — ROCURONIUM BROMIDE 100 MG/10ML IV SOLN
INTRAVENOUS | Status: DC | PRN
  Administered 2013-11-12: 50 mg via INTRAVENOUS

## 2013-11-12 MED ORDER — BACITRACIN ZINC 500 UNIT/GM EX OINT
TOPICAL_OINTMENT | CUTANEOUS | Status: AC
  Filled 2013-11-12: qty 15

## 2013-11-12 MED ORDER — PROPOFOL 10 MG/ML IV BOLUS
INTRAVENOUS | Status: DC | PRN
  Administered 2013-11-12: 200 mg via INTRAVENOUS

## 2013-11-12 MED ORDER — FERROUS SULFATE 325 (65 FE) MG PO TABS
325.0000 mg | ORAL_TABLET | Freq: Every day | ORAL | Status: DC
  Administered 2013-11-13: 325 mg via ORAL
  Filled 2013-11-12: qty 1

## 2013-11-12 MED ORDER — BUPIVACAINE HCL (PF) 0.25 % IJ SOLN
INTRAMUSCULAR | Status: AC
  Filled 2013-11-12: qty 30

## 2013-11-12 MED ORDER — MIDAZOLAM HCL 2 MG/2ML IJ SOLN
INTRAMUSCULAR | Status: AC
  Filled 2013-11-12: qty 2

## 2013-11-12 MED ORDER — LACTATED RINGERS IV SOLN
INTRAVENOUS | Status: DC | PRN
  Administered 2013-11-12: 18:00:00 via INTRAVENOUS

## 2013-11-12 MED ORDER — METOCLOPRAMIDE HCL 5 MG/ML IJ SOLN: 5.0000 mg | Freq: Three times a day (TID) | INTRAMUSCULAR | Status: DC | PRN

## 2013-11-12 MED ORDER — PHENYLEPHRINE 40 MCG/ML (10ML) SYRINGE FOR IV PUSH (FOR BLOOD PRESSURE SUPPORT)
PREFILLED_SYRINGE | INTRAVENOUS | Status: AC
  Filled 2013-11-12: qty 10

## 2013-11-12 MED ORDER — DORZOLAMIDE HCL-TIMOLOL MAL 2-0.5 % OP SOLN
1.0000 [drp] | Freq: Two times a day (BID) | OPHTHALMIC | Status: DC
  Administered 2013-11-12 – 2013-11-13 (×2): 1 [drp] via OPHTHALMIC
  Filled 2013-11-12: qty 10

## 2013-11-12 MED ORDER — LABETALOL HCL 5 MG/ML IV SOLN
10.0000 mg | Freq: Once | INTRAVENOUS | Status: AC
  Administered 2013-11-12: 10 mg via INTRAVENOUS

## 2013-11-12 MED ORDER — GLIPIZIDE ER 2.5 MG PO TB24
2.5000 mg | ORAL_TABLET | Freq: Every day | ORAL | Status: DC
  Administered 2013-11-13: 2.5 mg via ORAL
  Filled 2013-11-12: qty 1

## 2013-11-12 MED ORDER — HYDROMORPHONE HCL PF 1 MG/ML IJ SOLN
0.2500 mg | INTRAMUSCULAR | Status: DC | PRN
  Administered 2013-11-12 (×2): 0.5 mg via INTRAVENOUS

## 2013-11-12 MED ORDER — FENTANYL CITRATE 0.05 MG/ML IJ SOLN
INTRAMUSCULAR | Status: AC
  Filled 2013-11-12: qty 5

## 2013-11-12 MED ORDER — HYDROCODONE-ACETAMINOPHEN 5-325 MG PO TABS
1.0000 | ORAL_TABLET | Freq: Four times a day (QID) | ORAL | Status: DC | PRN
  Administered 2013-11-13: 1 via ORAL
  Filled 2013-11-12 (×2): qty 1

## 2013-11-12 MED ORDER — OXYCODONE HCL 5 MG PO TABS
5.0000 mg | ORAL_TABLET | Freq: Once | ORAL | Status: AC | PRN
  Administered 2013-11-12: 5 mg via ORAL

## 2013-11-12 MED ORDER — ASPIRIN EC 81 MG PO TBEC
81.0000 mg | DELAYED_RELEASE_TABLET | Freq: Every day | ORAL | Status: DC
  Administered 2013-11-13: 81 mg via ORAL
  Filled 2013-11-12: qty 1

## 2013-11-12 MED ORDER — BUPIVACAINE-EPINEPHRINE (PF) 0.25% -1:200000 IJ SOLN
INTRAMUSCULAR | Status: AC
  Filled 2013-11-12: qty 30

## 2013-11-12 MED ORDER — NEOSTIGMINE METHYLSULFATE 1 MG/ML IJ SOLN
INTRAMUSCULAR | Status: DC | PRN
  Administered 2013-11-12: 3 mg via INTRAVENOUS

## 2013-11-12 MED ORDER — MIDAZOLAM HCL 5 MG/ML IJ SOLN: 1.0000 mg | Freq: Once | INTRAMUSCULAR | Status: DC

## 2013-11-12 MED ORDER — FENTANYL CITRATE 0.05 MG/ML IJ SOLN: 50.0000 ug | INTRAMUSCULAR | Status: DC | PRN

## 2013-11-12 MED ORDER — OXYCODONE HCL 5 MG/5ML PO SOLN: 5.0000 mg | Freq: Once | ORAL | Status: AC | PRN

## 2013-11-12 MED ORDER — LABETALOL HCL 5 MG/ML IV SOLN
INTRAVENOUS | Status: AC
  Filled 2013-11-12: qty 4

## 2013-11-12 MED ORDER — DOCUSATE SODIUM 100 MG PO CAPS
100.0000 mg | ORAL_CAPSULE | Freq: Two times a day (BID) | ORAL | Status: DC
  Administered 2013-11-12 – 2013-11-13 (×2): 100 mg via ORAL
  Filled 2013-11-12: qty 1

## 2013-11-12 MED ORDER — EPHEDRINE SULFATE 50 MG/ML IJ SOLN: INTRAMUSCULAR | Status: DC | PRN

## 2013-11-12 MED ORDER — ONDANSETRON HCL 4 MG/2ML IJ SOLN: 4.0000 mg | Freq: Once | INTRAMUSCULAR | Status: AC | PRN

## 2013-11-12 MED ORDER — ATORVASTATIN CALCIUM 40 MG PO TABS
40.0000 mg | ORAL_TABLET | Freq: Every day | ORAL | Status: DC
Start: 2013-11-12 — End: 2013-11-13
  Administered 2013-11-12: 40 mg via ORAL
  Filled 2013-11-12 (×2): qty 1

## 2013-11-12 MED ORDER — PROPOFOL 10 MG/ML IV BOLUS
INTRAVENOUS | Status: AC
  Filled 2013-11-12: qty 20

## 2013-11-12 MED ORDER — LOSARTAN POTASSIUM 50 MG PO TABS
100.0000 mg | ORAL_TABLET | Freq: Every day | ORAL | Status: DC
  Administered 2013-11-12 – 2013-11-13 (×2): 100 mg via ORAL
  Filled 2013-11-12 (×2): qty 2

## 2013-11-12 MED ORDER — ENOXAPARIN SODIUM 30 MG/0.3ML ~~LOC~~ SOLN: 30.0000 mg | SUBCUTANEOUS | Status: DC

## 2013-11-12 MED ORDER — BUPIVACAINE HCL (PF) 0.25 % IJ SOLN
INTRAMUSCULAR | Status: DC | PRN
  Administered 2013-11-12: 30 mL

## 2013-11-12 MED ORDER — BUPIVACAINE-EPINEPHRINE PF 0.5-1:200000 % IJ SOLN
INTRAMUSCULAR | Status: DC | PRN
  Administered 2013-11-12: 30 mL via PERINEURAL

## 2013-11-12 MED ORDER — ONDANSETRON HCL 4 MG/2ML IJ SOLN: 4.0000 mg | Freq: Four times a day (QID) | INTRAMUSCULAR | Status: DC | PRN

## 2013-11-12 MED ORDER — ENOXAPARIN SODIUM 30 MG/0.3ML ~~LOC~~ SOLN
30.0000 mg | SUBCUTANEOUS | Status: DC
Start: 2013-11-13 — End: 2013-11-13
  Administered 2013-11-13: 30 mg via SUBCUTANEOUS
  Filled 2013-11-12: qty 0.3

## 2013-11-12 MED ORDER — METOCLOPRAMIDE HCL 5 MG PO TABS: 5.0000 mg | ORAL_TABLET | Freq: Three times a day (TID) | ORAL | Status: DC | PRN

## 2013-11-12 MED ORDER — BACITRACIN ZINC 500 UNIT/GM EX OINT
TOPICAL_OINTMENT | CUTANEOUS | Status: DC | PRN
  Administered 2013-11-12: 1 via TOPICAL

## 2013-11-12 SURGICAL SUPPLY — 68 items
BANDAGE ESMARK 6X9 LF (GAUZE/BANDAGES/DRESSINGS) ×1 IMPLANT
BIT DRILL 2.5X2.75 QC CALB (BIT) ×2 IMPLANT
BIT DRILL 3.5X5.5 QC CALB (BIT) ×2 IMPLANT
BLADE SURG 15 STRL LF DISP TIS (BLADE) ×1 IMPLANT
BLADE SURG 15 STRL SS (BLADE)
BNDG CMPR 9X6 STRL LF SNTH (GAUZE/BANDAGES/DRESSINGS) ×1
BNDG COHESIVE 4X5 TAN STRL (GAUZE/BANDAGES/DRESSINGS) ×3 IMPLANT
BNDG COHESIVE 6X5 TAN STRL LF (GAUZE/BANDAGES/DRESSINGS) ×3 IMPLANT
BNDG ESMARK 6X9 LF (GAUZE/BANDAGES/DRESSINGS) ×3
CANISTER SUCT 3000ML (MISCELLANEOUS) ×3 IMPLANT
CHLORAPREP W/TINT 26ML (MISCELLANEOUS) ×5 IMPLANT
COVER SURGICAL LIGHT HANDLE (MISCELLANEOUS) ×3 IMPLANT
CUFF TOURNIQUET SINGLE 34IN LL (TOURNIQUET CUFF) ×3 IMPLANT
CUFF TOURNIQUET SINGLE 44IN (TOURNIQUET CUFF) IMPLANT
DRAPE C-ARM 42X72 X-RAY (DRAPES) ×3 IMPLANT
DRAPE C-ARMOR (DRAPES) ×3 IMPLANT
DRAPE U-SHAPE 47X51 STRL (DRAPES) ×3 IMPLANT
DRSG ADAPTIC 3X8 NADH LF (GAUZE/BANDAGES/DRESSINGS) ×2 IMPLANT
DRSG PAD ABDOMINAL 8X10 ST (GAUZE/BANDAGES/DRESSINGS) ×6 IMPLANT
ELECT REM PT RETURN 9FT ADLT (ELECTROSURGICAL) ×3
ELECTRODE REM PT RTRN 9FT ADLT (ELECTROSURGICAL) ×1 IMPLANT
GLOVE BIO SURGEON STRL SZ7 (GLOVE) ×5 IMPLANT
GLOVE BIO SURGEON STRL SZ8 (GLOVE) ×3 IMPLANT
GLOVE BIOGEL PI IND STRL 7.5 (GLOVE) ×1 IMPLANT
GLOVE BIOGEL PI IND STRL 8 (GLOVE) ×1 IMPLANT
GLOVE BIOGEL PI INDICATOR 7.5 (GLOVE) ×2
GLOVE BIOGEL PI INDICATOR 8 (GLOVE) ×2
GOWN STRL REUS W/ TWL LRG LVL3 (GOWN DISPOSABLE) ×2 IMPLANT
GOWN STRL REUS W/ TWL XL LVL3 (GOWN DISPOSABLE) ×1 IMPLANT
GOWN STRL REUS W/TWL LRG LVL3 (GOWN DISPOSABLE) ×6
GOWN STRL REUS W/TWL XL LVL3 (GOWN DISPOSABLE) ×6
K-WIRE ACE 1.6X6 (WIRE) ×6
KIT BASIN OR (CUSTOM PROCEDURE TRAY) ×3 IMPLANT
KIT ROOM TURNOVER OR (KITS) ×3 IMPLANT
KWIRE ACE 1.6X6 (WIRE) IMPLANT
NEEDLE 22X1 1/2 (OR ONLY) (NEEDLE) ×2 IMPLANT
NS IRRIG 1000ML POUR BTL (IV SOLUTION) ×3 IMPLANT
PACK ORTHO EXTREMITY (CUSTOM PROCEDURE TRAY) ×3 IMPLANT
PAD ARMBOARD 7.5X6 YLW CONV (MISCELLANEOUS) ×6 IMPLANT
PAD CAST 4YDX4 CTTN HI CHSV (CAST SUPPLIES) ×1 IMPLANT
PADDING CAST COTTON 4X4 STRL (CAST SUPPLIES) ×6
PLATE ACE 100DEG 6HOLE (Plate) ×2 IMPLANT
SCREW ACE CAN 4.0 44M (Screw) ×4 IMPLANT
SCREW CORTICAL 3.5MM  12MM (Screw) ×2 IMPLANT
SCREW CORTICAL 3.5MM  16MM (Screw) ×4 IMPLANT
SCREW CORTICAL 3.5MM  20MM (Screw) ×6 IMPLANT
SCREW CORTICAL 3.5MM 12MM (Screw) IMPLANT
SCREW CORTICAL 3.5MM 14MM (Screw) ×2 IMPLANT
SCREW CORTICAL 3.5MM 16MM (Screw) IMPLANT
SCREW CORTICAL 3.5MM 20MM (Screw) IMPLANT
SPLINT PLASTER CAST XFAST 5X30 (CAST SUPPLIES) IMPLANT
SPLINT PLASTER XFAST SET 5X30 (CAST SUPPLIES) ×2
SPONGE GAUZE 4X4 12PLY (GAUZE/BANDAGES/DRESSINGS) ×4 IMPLANT
SPONGE LAP 18X18 X RAY DECT (DISPOSABLE) ×3 IMPLANT
STAPLER VISISTAT 35W (STAPLE) IMPLANT
SUCTION FRAZIER TIP 10 FR DISP (SUCTIONS) ×3 IMPLANT
SUT MNCRL AB 3-0 PS2 18 (SUTURE) ×2 IMPLANT
SUT PROLENE 3 0 PS 2 (SUTURE) ×3 IMPLANT
SUT VIC AB 2-0 CT1 27 (SUTURE) ×6
SUT VIC AB 2-0 CT1 TAPERPNT 27 (SUTURE) ×2 IMPLANT
SUT VIC AB 3-0 PS2 18 (SUTURE) ×3
SUT VIC AB 3-0 PS2 18XBRD (SUTURE) ×1 IMPLANT
SYR CONTROL 10ML LL (SYRINGE) ×2 IMPLANT
TOWEL OR 17X24 6PK STRL BLUE (TOWEL DISPOSABLE) ×3 IMPLANT
TOWEL OR 17X26 10 PK STRL BLUE (TOWEL DISPOSABLE) ×3 IMPLANT
TUBE CONNECTING 12'X1/4 (SUCTIONS) ×1
TUBE CONNECTING 12X1/4 (SUCTIONS) ×2 IMPLANT
WATER STERILE IRR 1000ML POUR (IV SOLUTION) ×1 IMPLANT

## 2013-11-12 NOTE — Anesthesia Procedure Notes (Addendum)
Anesthesia Regional Block:  Popliteal block  Pre-Anesthetic Checklist: ,, timeout performed, Correct Patient, Correct Site, Correct Laterality, Correct Procedure, Correct Position, site marked, Risks and benefits discussed,  Surgical consent,  Pre-op evaluation,  At surgeon's request and post-op pain management  Laterality: Right  Prep: chloraprep       Needles:  Injection technique: Single-shot  Needle Type: Echogenic Stimulator Needle      Needle Gauge: 21 and 21 G    Additional Needles:  Procedures: ultrasound guided (picture in chart) and nerve stimulator Popliteal block  Nerve Stimulator or Paresthesia:  Response: plantar flexion of foot, 0.45 mA,   Additional Responses:   Narrative:  Start time: 11/12/2013 4:21 PM End time: 11/12/2013 4:31 PM Injection made incrementally with aspirations every 5 mL.  Performed by: Personally  Anesthesiologist: Dr Marcie Bal  Additional Notes: Functioning IV was confirmed and monitors were applied.  A 21mm 21ga Arrow echogenic stimulator needle was used. Sterile prep and drape,hand hygiene and sterile gloves were used.  Negative aspiration and negative test dose prior to incremental administration of local anesthetic. The patient tolerated the procedure well.  Ultrasound guidance: relevent anatomy identified, needle position confirmed, local anesthetic spread visualized around nerve(s), vascular puncture avoided.  Image printed for medical record.    Procedure Name: Intubation Date/Time: 11/12/2013 6:31 PM Performed by: Eligha Bridegroom Pre-anesthesia Checklist: Patient identified, Timeout performed, Emergency Drugs available, Suction available and Patient being monitored Patient Re-evaluated:Patient Re-evaluated prior to inductionOxygen Delivery Method: Circle system utilized Preoxygenation: Pre-oxygenation with 100% oxygen Intubation Type: IV induction Ventilation: Mask ventilation without difficulty Laryngoscope Size: Mac and  3 Grade View: Grade I Tube type: Oral Tube size: 7.5 mm Number of attempts: 1 Airway Equipment and Method: Stylet Placement Confirmation: ETT inserted through vocal cords under direct vision,  breath sounds checked- equal and bilateral and positive ETCO2 Secured at: 21 cm Tube secured with: Tape Dental Injury: Teeth and Oropharynx as per pre-operative assessment     Anesthesia Regional Block:   Narrative:

## 2013-11-12 NOTE — Anesthesia Preprocedure Evaluation (Addendum)
Anesthesia Evaluation  Patient identified by MRN, date of birth, ID band Patient awake    Reviewed: Allergy & Precautions, H&P , NPO status , Patient's Chart, lab work & pertinent test results  History of Anesthesia Complications (+) PONV  Airway Mallampati: II  Neck ROM: full    Dental   Pulmonary shortness of breath,          Cardiovascular hypertension,     Neuro/Psych  Headaches,    GI/Hepatic GERD-  ,  Endo/Other  diabetes, Type 2, Oral Hypoglycemic AgentsHyperthyroidism   Renal/GU      Musculoskeletal  (+) Arthritis -,   Abdominal   Peds  Hematology   Anesthesia Other Findings   Reproductive/Obstetrics                          Anesthesia Physical Anesthesia Plan  ASA: III  Anesthesia Plan: General   Post-op Pain Management: MAC Combined w/ Regional for Post-op pain   Induction: Intravenous  Airway Management Planned: LMA  Additional Equipment:   Intra-op Plan:   Post-operative Plan: Extubation in OR  Informed Consent: I have reviewed the patients History and Physical, chart, labs and discussed the procedure including the risks, benefits and alternatives for the proposed anesthesia with the patient or authorized representative who has indicated his/her understanding and acceptance.     Plan Discussed with: CRNA, Anesthesiologist and Surgeon  Anesthesia Plan Comments:        Anesthesia Quick Evaluation

## 2013-11-12 NOTE — Transfer of Care (Signed)
Immediate Anesthesia Transfer of Care Note  Patient: Alexis Singh  Procedure(s) Performed: Procedure(s): OPEN REDUCTION INTERNAL FIXATION (ORIF) RIGHT ANKLE FRACTURE (Right)  Patient Location: PACU  Anesthesia Type:GA combined with regional for post-op pain  Level of Consciousness: awake, alert  and oriented  Airway & Oxygen Therapy: Patient Spontanous Breathing and Patient connected to nasal cannula oxygen  Post-op Assessment: Report given to PACU RN and Post -op Vital signs reviewed and stable  Post vital signs: Reviewed and stable  Complications: No apparent anesthesia complications

## 2013-11-12 NOTE — Progress Notes (Signed)
Dr Tamala Julian aware SBP 180-193. No new orders, OK to tx to floor.

## 2013-11-12 NOTE — Preoperative (Signed)
Beta Blockers   Reason not to administer Beta Blockers:Not Applicable 

## 2013-11-12 NOTE — Anesthesia Postprocedure Evaluation (Signed)
  Anesthesia Post-op Note  Patient: Alexis Singh  Procedure(s) Performed: Procedure(s): OPEN REDUCTION INTERNAL FIXATION (ORIF) RIGHT ANKLE FRACTURE (Right)  Patient Location: PACU  Anesthesia Type:General  Level of Consciousness: awake, alert , sedated and patient cooperative  Airway and Oxygen Therapy: Patient Spontanous Breathing  Post-op Pain: moderate  Post-op Assessment: Post-op Vital signs reviewed, Patient's Cardiovascular Status Stable, Respiratory Function Stable, Patent Airway, No signs of Nausea or vomiting and Pain level controlled  Post-op Vital Signs: stable  Complications: No apparent anesthesia complications

## 2013-11-12 NOTE — Brief Op Note (Signed)
11/12/2013  7:40 PM  PATIENT:  Alexis Singh  68 y.o. female  PRE-OPERATIVE DIAGNOSIS:  RIGHT ANKLE TRI-MALLEOLAR FRACTURE   POST-OPERATIVE DIAGNOSIS:  RIGHT ANKLE TRI-MALLEOLAR FRACTURE   Procedure(s): Open reduction and internal fixation of right ankle trimalleolar fracture without fixation of the posterior lip  SURGEON:  Wylene Simmer, MD  ASSISTANT: Nicki Reaper Flowers, PA-C  ANESTHESIA:   General, regional  EBL:  minimal   TOURNIQUET:  45 min at 595 mm Hg  COMPLICATIONS:  None apparent  DISPOSITION:  Extubated, awake and stable to recovery.  DICTATION ID:  [id number]

## 2013-11-13 ENCOUNTER — Encounter (HOSPITAL_COMMUNITY): Payer: Self-pay | Admitting: Orthopedic Surgery

## 2013-11-13 LAB — GLUCOSE, CAPILLARY
GLUCOSE-CAPILLARY: 151 mg/dL — AB (ref 70–99)
Glucose-Capillary: 113 mg/dL — ABNORMAL HIGH (ref 70–99)

## 2013-11-13 MED ORDER — HYDROCODONE-ACETAMINOPHEN 5-325 MG PO TABS: 1.0000 | ORAL_TABLET | ORAL | Status: DC | PRN

## 2013-11-13 NOTE — Care Management Note (Signed)
    Page 1 of 1   11/13/2013     1:24:15 PM   CARE MANAGEMENT NOTE 11/13/2013  Patient:  Alexis Singh, Alexis Singh   Account Number:  [id number]  Date Initiated:  11/13/2013  Documentation initiated by:  Magdalen Spatz  Subjective/Objective Assessment:     Action/Plan:   Anticipated DC Date:  11/13/2013   Anticipated DC Plan:  [place]         Choice offered to / List presented to:  C-1 Patient   DME arranged  [place]      DME agency  [place] arranged  HH-2 PT      [place].   Status of service:  Completed, signed off Medicare Important Message given?   (If response is "NO", the following Medicare IM given date fields will be blank) Date Medicare IM given:   Date Additional Medicare IM given:    Discharge Disposition:    Per UR Regulation:    If discussed at Long Length of Stay Meetings, dates discussed:    Comments:  11-13-13 Confirmed address with patinet and her husband. They have already received wheelchair and 3 n 1 from [place] , want rolling walker through [place] and delivered to home address. Faxed order / information to [place] and confirmed fax received and delivery to home available. Patient has [employer] contact information. Magdalen Spatz RN BSN [phone]

## 2013-11-13 NOTE — Progress Notes (Signed)
Discharge teaching was done and went over with patient and a family member. Patient was given prescription for hydrocodone to take to pharmacy and a prescription for a rolling walker . Patient and her family member stated that they did not have any questions. Now waiting for volunteer to assist to his transportation.

## 2013-11-13 NOTE — Discharge Summary (Signed)
Physician Discharge Summary  Patient ID: Alexis Singh MRN: [id number] DOB/AGE: 1946/07/03 68 y.o.  Admit date: 11/12/2013 Discharge date: 11/13/2013  Admission Diagnoses: Right ankle trimal fracture  Discharge Diagnoses: same Active Problems:   Closed trimalleolar fracture of right ankle   Discharged Condition: good  Hospital Course: Pt brought to [facility] on 11/12/2013 for surgical repair of right ankle trimalleolar fracture.  All risks and benefits as well as alternative treatment options were discussed with pt and she elected to proceed with surgery.  Procedure was performed by Dr. Wylene Simmer without complication and pt was recovered in the PACU and transferred to the floor for further post operative care.  During her stay pt underwent PT/OT evaluation for recommendation for aftercare accommodations.  She also was given Lovenox for blood clot prevention.  On 11/13/2013 pt was afebrile and all vital signs were stable and was appropriate for discharge home.  Prognosis for the pt is good and she will follow up with Dr. Doran Durand in 2 weeks.  Consults: None  Significant Diagnostic Studies: none  Treatments: surgery: as stated above  Discharge Exam: Blood pressure 174/80, pulse 94, temperature 98.4 F (36.9 C), temperature source Oral, resp. rate 20, height 5\' 4"  (1.626 m), weight 83.2 kg (183 lb 6.8 oz), SpO2 96.00%. WD WN 68y/o female in NAD, A/Ox3, appears stated age.  EOMI, mood and affect normal, respirations unlabored.  Splint C/D/I, in proper placement and in good repair.  Distal toes well perfused, non-mobile with decreased sensation due to block.  Disposition: 01-Home or Self Care  Discharge Orders   Future Appointments Provider Department Dept Phone   07/05/2014 11:00 AM Terrance Mass, MD [facility] [phone]   Future Orders Complete By Expires   Call MD / Call 911  As directed    Comments:     If you experience chest pain or shortness of  breath, CALL 911 and be transported to the hospital emergency room.  If you develope a fever above 101 F, pus (white drainage) or increased drainage or redness at the wound, or calf pain, call your surgeon's office.   Constipation Prevention  As directed    Comments:     Drink plenty of fluids.  Prune juice may be helpful.  You may use a stool softener, such as Colace (over the counter) 100 mg twice a day.  Use MiraLax (over the counter) for constipation as needed.   Diet - low sodium heart healthy  As directed    Driving restrictions  As directed    Comments:     No driving for 6 weeks   Increase activity slowly as tolerated  As directed    Lifting restrictions  As directed    Comments:     No lifting for 6 weeks       Medication List    STOP taking these medications       aspirin EC 81 MG tablet      TAKE these medications       atorvastatin 40 MG tablet  Commonly known as:  LIPITOR  Take 40 mg by mouth at bedtime.     brimonidine 0.1 % Soln  Commonly known as:  ALPHAGAN P  Place 1 drop into both eyes 2 (two) times daily.     dorzolamide-timolol 22.3-6.8 MG/ML ophthalmic solution  Commonly known as:  COSOPT  Place 1 drop into both eyes 2 (two) times daily.     ferrous sulfate 325 (65 FE) MG  tablet  Take 325 mg by mouth daily.     glipiZIDE 2.5 MG 24 hr tablet  Commonly known as:  GLUCOTROL XL  Take 2.5 mg by mouth daily.     HYDROcodone-acetaminophen 5-325 MG per tablet  Commonly known as:  NORCO/VICODIN  Take 1 tablet by mouth every 6 (six) hours as needed (for pain).     HYDROcodone-acetaminophen 5-325 MG per tablet  Commonly known as:  NORCO  Take 1-2 tablets by mouth every 4 (four) hours as needed for moderate pain.     hydrocortisone 10 MG tablet  Commonly known as:  CORTEF  Take 5-15 mg by mouth daily. Patient takes 15mg  in the morning and 5mg  in the afternoon     latanoprost 0.005 % ophthalmic solution  Commonly known as:  XALATAN  Place 1 drop into  both eyes at bedtime.     levothyroxine 88 MCG tablet  Commonly known as:  SYNTHROID, LEVOTHROID  Take 88 mcg by mouth daily before breakfast.     losartan 100 MG tablet  Commonly known as:  COZAAR  Take 100 mg by mouth daily.     ondansetron 4 MG tablet  Commonly known as:  ZOFRAN  Take 4 mg by mouth every 6 (six) hours as needed for nausea or vomiting.           Follow-up Information   Follow up with HEWITT, Jenny Reichmann, MD. Schedule an appointment as soon as possible for a visit in 2 weeks. (For suture removal )    Specialty:  Orthopedic Surgery   Contact information:   [address] [place] [address] [phone]       Signed: Trula Ore 11/13/2013, 8:13 AM

## 2013-11-13 NOTE — Discharge Instructions (Addendum)
Alexis Simmer, MD [place]  Please read the following information regarding your care after surgery.  Medications  You only need a prescription for the narcotic pain medicine (ex. oxycodone, Percocet, Norco).  All of the other medicines listed below are available over the counter. X acetominophen (Tylenol) 650 mg every 4-6 hours as you need for minor pain X Norco as prescribed for moderate to severe pain  Narcotic pain medicine (ex. oxycodone, Percocet, Vicodin) will cause constipation.  To prevent this problem, take the following medicines while you are taking any pain medicine. X docusate sodium (Colace) 100 mg twice a day X senna (Senokot) 2 tablets twice a day  X To help prevent blood clots, take an aspirin (325 mg) once a day for a month after surgery.  You should also get up every hour while you are awake to move around.    Weight Bearing X Do not bear any weight on the operated leg or foot.  Cast / Splint / Dressing X Keep your splint or cast clean and dry.  Dont put anything (coat hanger, pencil, etc) down inside of it.  If it gets damp, use a hair dryer on the cool setting to dry it.  If it gets soaked, call the office to schedule an appointment for a cast change.    Swelling It is normal for you to have swelling where you had surgery.  To reduce swelling and pain, keep your toes above your nose for at least 3 days after surgery.  It may be necessary to keep your foot or leg elevated for several weeks.  If it hurts, it should be elevated.  Follow Up Call my office at [phone] when you are discharged from the hospital or surgery center to schedule an appointment to be seen two weeks after surgery.  Call my office at [phone] if you develop a fever >101.5 F, nausea, vomiting, bleeding from the surgical site or severe pain.     [employer] [phone] will deliver wheelchair to home

## 2013-11-13 NOTE — Evaluation (Signed)
Physical Therapy Evaluation Patient Details Name: Alexis Singh MRN: [id number] DOB: 02-Dec-1945 Today's Date: 11/13/2013 Time: 1045-1100 PT Time Calculation (min): 15 min  PT Assessment / Plan / Recommendation History of Present Illness  Pt brought to [facility] [facility] on 11/12/2013 for surgical repair of right ankle trimalleolar fracture.  All risks and benefits as well as alternative treatment options were discussed with pt and she elected to proceed with surgery.  Procedure was performed by Dr. Wylene Simmer without complication and pt was recovered in the PACU and transferred to the floor for further post operative care.  Clinical Impression  Pt presents with decreased strength and mobility, will benefit from acute PT services to address deficits and increase functional independence.    PT Assessment  Patient needs continued PT services    Follow Up Recommendations  Home health PT;Supervision/Assistance - 24 hour    Does the patient have the potential to tolerate intense rehabilitation      Barriers to Discharge Decreased caregiver support family only available intermittently    Equipment Recommendations  Rolling walker with 5" wheels    Recommendations for Other Services     Frequency Min 5X/week    Precautions / Restrictions Precautions Precautions: Fall Restrictions Weight Bearing Restrictions: Yes RLE Weight Bearing: Non weight bearing   Pertinent Vitals/Pain Pt c/o R LE pain with mobility, eases with rest. RN aware      Mobility  Bed Mobility Overal bed mobility: Needs Assistance Bed Mobility: Supine to Sit Supine to sit: Min assist General bed mobility comments: assist for R LE Transfers Overall transfer level: Needs assistance Equipment used: Rolling walker (2 wheeled) Transfers: Sit to/from Stand Sit to Stand: Min assist General transfer comment: lfiting assist, cues for hand placement Ambulation/Gait Ambulation/Gait assistance: Min assist Ambulation  Distance (Feet): 3 Feet Assistive device: Rolling walker (2 wheeled) General Gait Details: pt able to hop 5 small steps, maintaining NWB on R LE    Exercises     PT Diagnosis: Difficulty walking;Generalized weakness  PT Problem List: Decreased strength;Decreased activity tolerance;Decreased balance;Decreased mobility;Decreased knowledge of use of DME PT Treatment Interventions: DME instruction;Stair training;Functional mobility training;Therapeutic activities;Balance training;Therapeutic exercise;Neuromuscular re-education;Modalities;Wheelchair mobility training;Patient/family education;Gait training     PT Goals(Current goals can be found in the care plan section) Acute Rehab PT Goals Patient Stated Goal: none stated PT Goal Formulation: With patient/family Time For Goal Achievement: 11/27/13 Potential to Achieve Goals: Good  Visit Information  Last PT Received On: 11/13/13 Assistance Needed: +1 History of Present Illness: Pt brought to [facility] on 11/12/2013 for surgical repair of right ankle trimalleolar fracture.  All risks and benefits as well as alternative treatment options were discussed with pt and she elected to proceed with surgery.  Procedure was performed by Dr. Wylene Simmer without complication and pt was recovered in the PACU and transferred to the floor for further post operative care.       Prior [place] expects to be discharged to:: Private residence Living Arrangements: Spouse/significant other Available Help at Discharge: Family;Available PRN/intermittently Type of Home: House Home Access: Ramped entrance Home Layout: One level Home Equipment: [place] - 4 wheels;Bedside commode;Wheelchair - manual Prior Function Level of Independence: Independent with assistive device(s) Communication Communication: No difficulties    Cognition  Cognition Arousal/Alertness: Lethargic Behavior During Therapy: Flat affect Overall Cognitive  Status: Within Functional Limits for tasks assessed    Extremity/Trunk Assessment Upper Extremity Assessment Upper Extremity Assessment: Generalized weakness Lower Extremity Assessment Lower Extremity  Assessment: Generalized weakness Cervical / Trunk Assessment Cervical / Trunk Assessment: Normal   Balance    End of Session PT - End of Session Equipment Utilized During Treatment: Gait belt Activity Tolerance: Patient limited by fatigue Patient left: in chair;with call bell/phone within reach;with family/visitor present Nurse Communication: Mobility status  GP     Sam Overbeck 11/13/2013, 11:04 AM

## 2013-11-13 NOTE — Op Note (Signed)
NAME:  Alexis Singh, Alexis Singh NO.:  [id number]  MEDICAL RECORD NO.:  [id number]  LOCATION:  [id number]                        FACILITY:  [facility]  PHYSICIAN:  Wylene Simmer, MD        DATE OF BIRTH:  1946/02/12  DATE OF PROCEDURE:  11/12/2013 DATE OF DISCHARGE:                              OPERATIVE REPORT   PREOPERATIVE DIAGNOSIS:  Right ankle trimalleolar fracture.  POSTOPERATIVE DIAGNOSIS:  Right ankle trimalleolar fracture.  PROCEDURE:  Open reduction and internal fixation of the right ankle trimalleolar fracture without fixation of the posterior lip.  SURGEON:  Wylene Simmer, MD  ASSISTANT:  Nicki Reaper Flowers, PA-C  ANESTHESIA:  General, regional.  ESTIMATED BLOOD LOSS:  Minimal.  TOURNIQUET TIME:  45 minutes at 300 am mmHg.  COMPLICATIONS:  None apparent.  DISPOSITION:  Extubated, awake and stable to recovery.  INDICATIONS FOR PROCEDURE:  The patient is a 68 year old female with past medical history significant for diabetes.  She sustained a right ankle trimalleolar fracture just over a week ago.  She presents now for operative treatment of this unstable displaced ankle fracture.  She understands the risks and benefits, the alternative treatment options and elects surgical treatment.  She specifically understands risks of bleeding, infection, nerve damage, blood clots, need for additional surgery, amputation, and death.  PROCEDURE IN DETAIL:  After preoperative consent was obtained and the correct operative site was identified, the patient was brought to the operating room and placed supine on the operating table.  General anesthesia was induced.  Preoperative antibiotics were administered. Surgical time-out was taken.  Right lower extremity was prepped and draped in standard sterile fashion with a tourniquet around the thigh. The extremity was exsanguinated.  The tourniquet was inflated to 300 am mmHg.  A longitudinal incision was made over the lateral  malleolus. Sharp dissection was carried down through the skin and subcutaneous tissue.  Fracture site was identified.  It was cleaned of all hematoma. The fracture was reduced and held with a tenaculum.  A 3.5-mm fully- threaded lag screw was inserted from posterior to anterior across the fracture site.  A 6-hole 1/3 tubular plate was then contoured to fit the lateral malleolus.  It was fixed with 2 unicortical and 1 bicortical screw distally and 3 bicortical screws proximally.  AP and lateral radiographs confirmed appropriate position of the plate and appropriate reduction of the fracture.  Attention was then turned to the medial malleolus where a longitudinal incision was made.  Sharp dissection was carried down through the skin and subcutaneous tissue.  Care was taken to protect the saphenous nerve and vein.  The fracture site was cleaned of all hematoma and all of the periosteum was excised from around the fracture line.  Fracture was reduced and held provisionally with a tenaculum.  AP and lateral radiographs confirmed appropriate reduction of the fracture.  Two 4-mm partially threaded cannulated screws were inserted over guide pins placed in the medial malleolus.  AP and lateral fluoroscopic images confirmed appropriate reduction of the fracture and appropriate position of both screws.  Final AP, mortise, and lateral radiographs confirmed appropriate reduction of the medial and lateral malleolus fractures in appropriate position and length of all hardware.  The posterior malleolus fracture was noted to be appropriately aligned.  Both wounds were then irrigated copiously.  The subcutaneous tissue was approximated with inverted simple sutures of 3-0 Monocryl and running 3-0 monofilament sutures were used to close both skin incisions.  Sterile dressings were applied followed by a well-padded short-leg splint. Tourniquet was released at 45 minutes after application of the dressings.   The patient was awakened by anesthesia and transported to the recovery room in stable condition.  FOLLOWUP PLAN:  The patient will be nonweightbearing on the right lower extremity.  She will be admitted and will have physical therapy and case management consultations tomorrow.  She will start on aspirin for DVT prophylaxis.  Scott Flowers, PA-C was present and scrubbed for the duration of the case.  His assistance was essential in gaining and maintaining exposure, performing the operation, closing and dressing the wounds, and applying a splint.     Wylene Simmer, MD     JH/MEDQ  D:  11/12/2013  T:  11/13/2013  Job:  [id number]

## 2013-11-13 NOTE — Plan of Care (Signed)
Problem: Discharge Progression Outcomes Goal: Staples/sutures removed Outcome: Not Applicable Date Met:  75/43/60 Pt has an appointment to remove staples outpatient

## 2013-11-13 NOTE — Progress Notes (Signed)
Subjective: 1 Day Post-Op Procedure(s) (LRB): OPEN REDUCTION INTERNAL FIXATION (ORIF) RIGHT ANKLE FRACTURE (Right) Patient doing well this AM. Pain 6/10 but well controlled with medication.  Pt feels appetite has decreased but slept well last night. Nerve block still keeping RLE numb. Denies N/V/F/C, chest pain, SOB, or calf pain.  Objective: Vital signs in last 24 hours: Temp:  [97.7 F (36.5 C)-98.7 F (37.1 C)] 98.4 F (36.9 C) (02/13 0629) Pulse Rate:  [54-94] 94 (02/13 0629) Resp:  [8-20] 20 (02/13 0629) BP: (172-212)/(79-107) 174/80 mmHg (02/13 0629) SpO2:  [94 %-100 %] 96 % (02/13 0629) Weight:  [83.2 kg (183 lb 6.8 oz)] 83.2 kg (183 lb 6.8 oz) (02/12 2127)  Intake/Output from previous day: 02/12 0701 - 02/13 0700 In: 510 [P.O.:280; I.V.:230] Out: -  Intake/Output this shift:     Recent Labs  11/10/13 1348  HGB 10.1*    Recent Labs  11/10/13 1348  WBC 6.6  RBC 3.39*  HCT 29.5*  PLT 337    Recent Labs  11/10/13 1348  NA 139  K 4.9  CL 101  CO2 28  BUN 15  CREATININE 1.19*  GLUCOSE 193*  CALCIUM 9.5   No results found for this basename: LABPT, INR,  in the last 72 hours  WD WN 67y/o female in NAD, A/Ox3, appears stated age.  EOMI, mood and affect normal, respirations unlabored.  Splint C/D/I, in proper placement and in good repair.  Distal toes well perfused, non-mobile with decreased sensation due to block.  Assessment/Plan: 1 Day Post-Op Procedure(s) (LRB): OPEN REDUCTION INTERNAL FIXATION (ORIF) RIGHT ANKLE FRACTURE (Right) PT/OT eval today Continue Lovenox for today and Aspirin upon discharge for anticoagulation  Continue pain management F/u with Dr. Doran Durand in 2 weeks  Alexis Singh S 11/13/2013, 7:58 AM

## 2013-12-26 ENCOUNTER — Emergency Department (HOSPITAL_COMMUNITY): Payer: Medicare HMO

## 2013-12-26 ENCOUNTER — Emergency Department (HOSPITAL_COMMUNITY)
Admission: EM | Admit: 2013-12-26 | Discharge: 2013-12-26 | Disposition: A | Discharge status: Home / Self Care | Payer: Medicare HMO | Attending: Emergency Medicine | Admitting: Emergency Medicine

## 2013-12-26 ENCOUNTER — Encounter (HOSPITAL_COMMUNITY): Payer: Self-pay | Admitting: Emergency Medicine

## 2013-12-26 DIAGNOSIS — E669 Obesity, unspecified: Secondary | ICD-10-CM | POA: Insufficient documentation

## 2013-12-26 DIAGNOSIS — R079 Chest pain, unspecified: Secondary | ICD-10-CM | POA: Insufficient documentation

## 2013-12-26 DIAGNOSIS — Z853 Personal history of malignant neoplasm of breast: Secondary | ICD-10-CM | POA: Insufficient documentation

## 2013-12-26 DIAGNOSIS — Z79899 Other long term (current) drug therapy: Secondary | ICD-10-CM | POA: Insufficient documentation

## 2013-12-26 DIAGNOSIS — IMO0002 Reserved for concepts with insufficient information to code with codable children: Secondary | ICD-10-CM | POA: Insufficient documentation

## 2013-12-26 DIAGNOSIS — Z8719 Personal history of other diseases of the digestive system: Secondary | ICD-10-CM | POA: Insufficient documentation

## 2013-12-26 DIAGNOSIS — H409 Unspecified glaucoma: Secondary | ICD-10-CM | POA: Insufficient documentation

## 2013-12-26 DIAGNOSIS — Z8742 Personal history of other diseases of the female genital tract: Secondary | ICD-10-CM | POA: Insufficient documentation

## 2013-12-26 DIAGNOSIS — E0590 Thyrotoxicosis, unspecified without thyrotoxic crisis or storm: Secondary | ICD-10-CM | POA: Insufficient documentation

## 2013-12-26 DIAGNOSIS — Z7982 Long term (current) use of aspirin: Secondary | ICD-10-CM | POA: Insufficient documentation

## 2013-12-26 DIAGNOSIS — I10 Essential (primary) hypertension: Secondary | ICD-10-CM | POA: Insufficient documentation

## 2013-12-26 DIAGNOSIS — E119 Type 2 diabetes mellitus without complications: Secondary | ICD-10-CM | POA: Insufficient documentation

## 2013-12-26 DIAGNOSIS — M129 Arthropathy, unspecified: Secondary | ICD-10-CM | POA: Insufficient documentation

## 2013-12-26 DIAGNOSIS — R112 Nausea with vomiting, unspecified: Secondary | ICD-10-CM | POA: Insufficient documentation

## 2013-12-26 DIAGNOSIS — R1013 Epigastric pain: Secondary | ICD-10-CM | POA: Insufficient documentation

## 2013-12-26 DIAGNOSIS — E785 Hyperlipidemia, unspecified: Secondary | ICD-10-CM | POA: Insufficient documentation

## 2013-12-26 DIAGNOSIS — D649 Anemia, unspecified: Secondary | ICD-10-CM | POA: Insufficient documentation

## 2013-12-26 DIAGNOSIS — Z9089 Acquired absence of other organs: Secondary | ICD-10-CM | POA: Insufficient documentation

## 2013-12-26 DIAGNOSIS — Z85858 Personal history of malignant neoplasm of other endocrine glands: Secondary | ICD-10-CM | POA: Insufficient documentation

## 2013-12-26 DIAGNOSIS — Z9851 Tubal ligation status: Secondary | ICD-10-CM | POA: Insufficient documentation

## 2013-12-26 LAB — CBC WITH DIFFERENTIAL/PLATELET
BASOS PCT: 0 % (ref 0–1)
Basophils Absolute: 0 10*3/uL (ref 0.0–0.1)
Eosinophils Absolute: 0.1 10*3/uL (ref 0.0–0.7)
Eosinophils Relative: 2 % (ref 0–5)
HEMATOCRIT: 32.8 % — AB (ref 36.0–46.0)
HEMOGLOBIN: 10.7 g/dL — AB (ref 12.0–15.0)
LYMPHS ABS: 1.8 10*3/uL (ref 0.7–4.0)
Lymphocytes Relative: 34 % (ref 12–46)
MCH: 28.8 pg (ref 26.0–34.0)
MCHC: 32.6 g/dL (ref 30.0–36.0)
MCV: 88.4 fL (ref 78.0–100.0)
MONO ABS: 0.6 10*3/uL (ref 0.1–1.0)
MONOS PCT: 12 % (ref 3–12)
NEUTROS PCT: 53 % (ref 43–77)
Neutro Abs: 2.8 10*3/uL (ref 1.7–7.7)
Platelets: 227 10*3/uL (ref 150–400)
RBC: 3.71 MIL/uL — AB (ref 3.87–5.11)
RDW: 13.1 % (ref 11.5–15.5)
WBC: 5.3 10*3/uL (ref 4.0–10.5)

## 2013-12-26 LAB — COMPREHENSIVE METABOLIC PANEL
ALT: 42 U/L — AB (ref 0–35)
AST: 27 U/L (ref 0–37)
Albumin: 3.8 g/dL (ref 3.5–5.2)
Alkaline Phosphatase: 242 U/L — ABNORMAL HIGH (ref 39–117)
BUN: 15 mg/dL (ref 6–23)
CALCIUM: 9.6 mg/dL (ref 8.4–10.5)
CO2: 31 mEq/L (ref 19–32)
CREATININE: 1.18 mg/dL — AB (ref 0.50–1.10)
Chloride: 99 mEq/L (ref 96–112)
GFR calc Af Amer: 54 mL/min — ABNORMAL LOW (ref >90)
GFR, EST NON AFRICAN AMERICAN: 46 mL/min — AB (ref >90)
Glucose, Bld: 158 mg/dL — ABNORMAL HIGH (ref 70–99)
Potassium: 3.8 mEq/L (ref 3.7–5.3)
Sodium: 141 mEq/L (ref 137–147)
TOTAL PROTEIN: 7.8 g/dL (ref 6.0–8.3)
Total Bilirubin: 2.2 mg/dL — ABNORMAL HIGH (ref 0.3–1.2)

## 2013-12-26 LAB — LIPASE, BLOOD: Lipase: 28 U/L (ref 11–59)

## 2013-12-26 LAB — TROPONIN I

## 2013-12-26 LAB — D-DIMER, QUANTITATIVE (NOT AT ARMC): D-Dimer, Quant: 1.37 ug/mL-FEU — ABNORMAL HIGH (ref 0.00–0.48)

## 2013-12-26 MED ORDER — ONDANSETRON HCL 4 MG/2ML IJ SOLN
4.0000 mg | Freq: Once | INTRAMUSCULAR | Status: AC
  Administered 2013-12-26: 4 mg via INTRAVENOUS
  Filled 2013-12-26: qty 2

## 2013-12-26 MED ORDER — SODIUM CHLORIDE 0.9 % IJ SOLN
INTRAMUSCULAR | Status: AC
  Filled 2013-12-26: qty 250

## 2013-12-26 MED ORDER — IOHEXOL 350 MG/ML SOLN
100.0000 mL | Freq: Once | INTRAVENOUS | Status: AC | PRN
  Administered 2013-12-26: 100 mL via INTRAVENOUS

## 2013-12-26 NOTE — ED Notes (Signed)
Pt c/o mid center pressure that started this am with n/v, denies any sob, diaphoresis, pt has right lower leg in cast due to fracture in January,

## 2013-12-26 NOTE — ED Provider Notes (Signed)
CSN: [id number]     Arrival date & time 12/26/13  1755 History  This chart was scribed for Nat Christen, MD by Zettie Pho, ED Scribe. This patient was seen in room APA04/APA04 and the patient's care was started at 6:20 PM.    Chief Complaint  Patient presents with  . Chest Pain   The history is provided by the patient. No language interpreter was used.   HPI Comments: Alexis Singh is a 68 y.o. female with a history of obesity, hyperlipidemia, HTN, GERD, and hyperthyroidism who presents to the Emergency Department complaining of an intermittent pain, described as a pressure, to the substernal region of the chest/epigastric region of the abdomen with associated chills, nausea, and multiple episodes of non-bilious, non-bloody emesis onset this morning. She reports taking Zofran at home without significant relief. Patient states that the pain is exacerbated with movement and that this type of pain is new for her. She denies shortness of breath, diaphoresis. Patient also has a history of DM, breast cancer, osteoporosis, pituitary tumor, and anemia. She reports a familial history of CHF (father), but denies personal or familial history of MI. Patient also has a recent history of fracture to the right ankle on 10/31/2013 with ORIF repair on 11/12/2013.   PCP- Dr. Sinda Du  Past Medical History  Diagnosis Date  . Glaucoma   . Obesity   . Hyperlipidemia   . Thyroid disease     overactive thyroid  . Diabetes mellitus   . Cancer 1994    left breast  . Osteoporosis   . Pituitary tumor   . Hypertension   . Fibroid   . PONV (postoperative nausea and vomiting)   . Shortness of breath   . Blood in urine   . GERD (gastroesophageal reflux disease)   . Headache(784.0)   . Arthritis   . Anemia   . Hyperthyroidism    Past Surgical History  Procedure Laterality Date  . Breast lumpectomy      with removal of lymph nodes/had chemo and radiation  . Pituitary tumor removal  [place]  .  Tubal ligation    . Abdominal hysterectomy      TAH BSO  . Toe surgery    . Cholecystectomy    . Eye surgery      Laser  . Oophorectomy      BSO  . Wrist fracture surgery    . Vein procedure    . Left wrist surgery    . Orif ankle fracture Right 11/12/2013    Procedure: OPEN REDUCTION INTERNAL FIXATION (ORIF) RIGHT ANKLE FRACTURE;  Surgeon: Wylene Simmer, MD;  Location: [facility];  Service: Orthopedics;  Laterality: Right;   Family History  Problem Relation Age of Onset  . Breast cancer Mother     Age 68  . Hypertension Mother   . Heart failure Father   . Hypertension Sister   . Diabetes Brother   . Hypertension Brother   . Cancer Brother     Unsure what type   History  Substance Use Topics  . Smoking status: Never Smoker   . Smokeless tobacco: Not on file  . Alcohol Use: No   OB History   Grav Para Term Preterm Abortions TAB SAB Ect Mult Living   2 1 1  1     1      Review of Systems  A complete 10 system review of systems was obtained and all systems are negative except as noted in  the HPI and PMH.    Allergies  Ambien; Ciprofloxacin hcl; and Codeine  Home Medications   Current Outpatient Rx  Name  Route  Sig  Dispense  Refill  . amLODipine (NORVASC) 5 MG tablet   Oral   Take 5 mg by mouth daily.         Marland Kitchen aspirin 325 MG tablet   Oral   Take 325 mg by mouth daily.         Marland Kitchen atorvastatin (LIPITOR) 40 MG tablet   Oral   Take 40 mg by mouth at bedtime.          . brimonidine (ALPHAGAN P) 0.1 % SOLN   Both Eyes   Place 1 drop into both eyes 2 (two) times daily.          . dorzolamide-timolol (COSOPT) 22.3-6.8 MG/ML ophthalmic solution   Both Eyes   Place 1 drop into both eyes 2 (two) times daily.           . ferrous sulfate 325 (65 FE) MG tablet   Oral   Take 325 mg by mouth daily.         Marland Kitchen glipiZIDE (GLUCOTROL XL) 2.5 MG 24 hr tablet   Oral   Take 2.5 mg by mouth daily.         Marland Kitchen HYDROcodone-acetaminophen (NORCO/VICODIN) 5-325 MG per  tablet   Oral   Take 1 tablet by mouth every 6 (six) hours as needed (for pain).         . hydrocortisone (CORTEF) 10 MG tablet   Oral   Take 5-15 mg by mouth daily. Patient takes 15mg  in the morning and 5mg  in the afternoon         . latanoprost (XALATAN) 0.005 % ophthalmic solution   Both Eyes   Place 1 drop into both eyes at bedtime.         Marland Kitchen levothyroxine (SYNTHROID, LEVOTHROID) 88 MCG tablet   Oral   Take 88 mcg by mouth daily before breakfast.         . losartan (COZAAR) 100 MG tablet   Oral   Take 100 mg by mouth daily.           . ondansetron (ZOFRAN) 4 MG tablet   Oral   Take 4 mg by mouth every 6 (six) hours as needed for nausea or vomiting.          Triage Vitals: BP 148/78  Pulse 87  Temp(Src) 98.6 F (37 C) (Oral)  Resp 20  Ht 5\' 4"  (1.626 m)  Wt 179 lb (81.194 kg)  BMI 30.71 kg/m2  SpO2 97%  Physical Exam  Nursing note and vitals reviewed. Constitutional: She is oriented to person, place, and time. She appears well-developed and well-nourished.  HENT:  Head: Normocephalic and atraumatic.  Eyes: Conjunctivae and EOM are normal. Pupils are equal, round, and reactive to light.  Neck: Normal range of motion. Neck supple.  Cardiovascular: Normal rate, regular rhythm and normal heart sounds.   Pulmonary/Chest: Effort normal and breath sounds normal.  Abdominal: Soft. Bowel sounds are normal.  Musculoskeletal: Normal range of motion.  Neurological: She is alert and oriented to person, place, and time.  Skin: Skin is warm and dry.  Psychiatric: She has a normal mood and affect. Her behavior is normal.    ED Course  Procedures (including critical care time)  DIAGNOSTIC STUDIES: Oxygen Saturation is 97% on room air, normal by my interpretation.    COORDINATION  OF CARE: 6:25 PM- Will order blood labs (CMP, CBC, lipase, troponin, D-dimer), chest x-ray, and EKG. Discussed treatment plan with patient at bedside and patient verbalized agreement.    7:33 PM- Independently reviewed preliminary lab and imaging results. Will order a CT angio of the chest and a CT of the abdomen.   Results for orders placed during the hospital encounter of 12/26/13  COMPREHENSIVE METABOLIC PANEL      Result Value Ref Range   Sodium 141  137 - 147 mEq/L   Potassium 3.8  3.7 - 5.3 mEq/L   Chloride 99  96 - 112 mEq/L   CO2 31  19 - 32 mEq/L   Glucose, Bld 158 (*) 70 - 99 mg/dL   BUN 15  6 - 23 mg/dL   Creatinine, Ser 1.18 (*) 0.50 - 1.10 mg/dL   Calcium 9.6  8.4 - 10.5 mg/dL   Total Protein 7.8  6.0 - 8.3 g/dL   Albumin 3.8  3.5 - 5.2 g/dL   AST 27  0 - 37 U/L   ALT 42 (*) 0 - 35 U/L   Alkaline Phosphatase 242 (*) 39 - 117 U/L   Total Bilirubin 2.2 (*) 0.3 - 1.2 mg/dL   GFR calc non Af Amer 46 (*) >90 mL/min   GFR calc Af Amer 54 (*) >90 mL/min  CBC WITH DIFFERENTIAL      Result Value Ref Range   WBC 5.3  4.0 - 10.5 K/uL   RBC 3.71 (*) 3.87 - 5.11 MIL/uL   Hemoglobin 10.7 (*) 12.0 - 15.0 g/dL   HCT 32.8 (*) 36.0 - 46.0 %   MCV 88.4  78.0 - 100.0 fL   MCH 28.8  26.0 - 34.0 pg   MCHC 32.6  30.0 - 36.0 g/dL   RDW 13.1  11.5 - 15.5 %   Platelets 227  150 - 400 K/uL   Neutrophils Relative % 53  43 - 77 %   Neutro Abs 2.8  1.7 - 7.7 K/uL   Lymphocytes Relative 34  12 - 46 %   Lymphs Abs 1.8  0.7 - 4.0 K/uL   Monocytes Relative 12  3 - 12 %   Monocytes Absolute 0.6  0.1 - 1.0 K/uL   Eosinophils Relative 2  0 - 5 %   Eosinophils Absolute 0.1  0.0 - 0.7 K/uL   Basophils Relative 0  0 - 1 %   Basophils Absolute 0.0  0.0 - 0.1 K/uL  LIPASE, BLOOD      Result Value Ref Range   Lipase 28  11 - 59 U/L  TROPONIN I      Result Value Ref Range   Troponin I <0.30  <0.30 ng/mL  D-DIMER, QUANTITATIVE      Result Value Ref Range   D-Dimer, Quant 1.37 (*) 0.00 - 0.48 ug/mL-FEU   Dg Chest 2 View  12/26/2013   CLINICAL DATA:  Chest pain, shortness of breath, chills  EXAM: CHEST  2 VIEW  COMPARISON:  11/10/2013  FINDINGS: Lungs are clear.  No pleural  effusion or pneumothorax.  The heart is normal in size.  Visualized osseous structures are within normal limits.  Surgical clips overlying the left chest wall/axilla.  IMPRESSION: No evidence of acute cardiopulmonary disease.   Electronically Signed   By: Julian Hy M.D.   On: 12/26/2013 19:11   Ct Angio Chest Pe W/cm &/or Wo Cm  12/26/2013   CLINICAL DATA:  Upper abdominal pain, chest pain and  shortness of Breath.  EXAM: CT ANGIOGRAPHY CHEST  CT ABDOMEN AND PELVIS WITH CONTRAST  TECHNIQUE: Multidetector CT imaging of the chest was performed using the standard protocol during bolus administration of intravenous contrast. Multiplanar CT image reconstructions and MIPs were obtained to evaluate the vascular anatomy. Multidetector CT imaging of the abdomen and pelvis was performed using the standard protocol during bolus administration of intravenous contrast.  CONTRAST:  172mL OMNIPAQUE IOHEXOL 350 MG/ML SOLN  COMPARISON:  CT, 07/19/2010  FINDINGS: CTA CHEST FINDINGS  No evidence of a pulmonary embolus.  The heart is normal in size and configuration. Great vessels are normal in caliber. No mediastinal or hilar masses or adenopathy.  Lungs are essentially clear other than minimal basilar subsegmental atelectasis. No pleural effusion. No pneumothorax.  CT ABDOMEN and PELVIS FINDINGS  Liver is enlarged with innumerable cysts. Several of the cysts have peripheral calcifications. Appearance is similar to the prior study.  Normal spleen. Gallbladder is surgically absent. No bile duct dilation. Pancreas is unremarkable. No adrenal masses.  There are bilateral renal cysts. There is a nonobstructing stone in the midpole of the right kidney. No hydronephrosis. Normal ureters. Bladder is unremarkable.  Uterus is surgically absent.  There are no pelvic masses.  No pathologically enlarged lymph nodes seen. There are no abnormal fluid collections.  Normal bowel.  Normal appendix.  MUSCULOSKELETAL: No osteoblastic or  osteolytic lesions. Mild degenerative changes noted of the spine.  Review of the MIP images confirms the above findings.  IMPRESSION: 1. No evidence of a pulmonary embolus. No acute findings in the chest. 2. No acute findings below the diaphragm. 3. Numerous hepatic cysts as well as bilateral renal cysts consistent with polycystic kidney disease. This is essentially stable from the prior study. Liver is enlarged. 4. Status post hysterectomy. Mild degenerative changes noted of spine. 5. No other abnormalities.   Electronically Signed   By: Lajean Manes M.D.   On: 12/26/2013 21:00   Ct Abdomen Pelvis W Contrast  12/26/2013   CLINICAL DATA:  Upper abdominal pain, chest pain and shortness of Breath.  EXAM: CT ANGIOGRAPHY CHEST  CT ABDOMEN AND PELVIS WITH CONTRAST  TECHNIQUE: Multidetector CT imaging of the chest was performed using the standard protocol during bolus administration of intravenous contrast. Multiplanar CT image reconstructions and MIPs were obtained to evaluate the vascular anatomy. Multidetector CT imaging of the abdomen and pelvis was performed using the standard protocol during bolus administration of intravenous contrast.  CONTRAST:  149mL OMNIPAQUE IOHEXOL 350 MG/ML SOLN  COMPARISON:  CT, 07/19/2010  FINDINGS: CTA CHEST FINDINGS  No evidence of a pulmonary embolus.  The heart is normal in size and configuration. Great vessels are normal in caliber. No mediastinal or hilar masses or adenopathy.  Lungs are essentially clear other than minimal basilar subsegmental atelectasis. No pleural effusion. No pneumothorax.  CT ABDOMEN and PELVIS FINDINGS  Liver is enlarged with innumerable cysts. Several of the cysts have peripheral calcifications. Appearance is similar to the prior study.  Normal spleen. Gallbladder is surgically absent. No bile duct dilation. Pancreas is unremarkable. No adrenal masses.  There are bilateral renal cysts. There is a nonobstructing stone in the midpole of the right kidney. No  hydronephrosis. Normal ureters. Bladder is unremarkable.  Uterus is surgically absent.  There are no pelvic masses.  No pathologically enlarged lymph nodes seen. There are no abnormal fluid collections.  Normal bowel.  Normal appendix.  MUSCULOSKELETAL: No osteoblastic or osteolytic lesions. Mild degenerative changes noted of the spine.  Review of the MIP images confirms the above findings.  IMPRESSION: 1. No evidence of a pulmonary embolus. No acute findings in the chest. 2. No acute findings below the diaphragm. 3. Numerous hepatic cysts as well as bilateral renal cysts consistent with polycystic kidney disease. This is essentially stable from the prior study. Liver is enlarged. 4. Status post hysterectomy. Mild degenerative changes noted of spine. 5. No other abnormalities.   Electronically Signed   By: Lajean Manes M.D.   On: 12/26/2013 21:00       EKG Interpretation   Date/Time:  Saturday December 26 2013 18:04:49 EDT Ventricular Rate:  84 PR Interval:  176 QRS Duration: 82 QT Interval:  398 QTC Calculation: 470 R Axis:   -30 Text Interpretation:  Normal sinus rhythm Left axis deviation Inferior  infarct (cited on or before 20-May-2009) Anterolateral infarct (cited on  or before 05-Dec-2012) Abnormal ECG When compared with ECG of 02-Oct-2013  14:52, Questionable change in initial forces of Lateral leads Confirmed by  Shakaya Bhullar  MD, Sherlynn Tourville ([id number]) on 12/26/2013 6:39:11 PM      MDM   Final diagnoses:  Epigastric pain  Chest pain    Patient is in no acute distress. CT scan shows no evidence of pulmonary embolism. Total bilirubin and alkaline phosphatase noted to be elevated.  Will followup with primary care Dr. early in the week.  I personally performed the services described in this documentation, which was scribed in my presence. The recorded information has been reviewed and is accurate.     Nat Christen, MD 12/26/13 2235

## 2013-12-26 NOTE — Discharge Instructions (Signed)
Followup Dr. Luan Pulling early in the week. Remind him with you in emergency department Saturday night.   Blood work called alkaline phosphatase and total bilirubin were elevated.  Take your normal pain medicine.

## 2013-12-26 NOTE — ED Notes (Signed)
Pt. C/o nausea. EDP notified.

## 2014-01-20 ENCOUNTER — Encounter: Payer: Self-pay | Admitting: Podiatry

## 2014-01-20 ENCOUNTER — Ambulatory Visit (INDEPENDENT_AMBULATORY_CARE_PROVIDER_SITE_OTHER): Payer: Commercial Managed Care - HMO | Admitting: Podiatry

## 2014-01-20 VITALS — BP 186/100 | HR 70 | Resp 16

## 2014-01-20 DIAGNOSIS — B351 Tinea unguium: Secondary | ICD-10-CM

## 2014-01-20 NOTE — Progress Notes (Signed)
   Subjective:    Patient ID: Alexis Singh, female    DOB: 06-01-1946, 68 y.o.   MRN: [id number]  HPI Comments: "I need my toenails cut"  Patient states that she needs her toenails trimmed. She is unable to trim herself, plus she is diabetic.   Patient is wearing an boot on right foot. She states that she broke her ankle in January.      Review of Systems  Musculoskeletal: Positive for gait problem.  All other systems reviewed and are negative.      Objective:   Physical Exam        Assessment & Plan:

## 2014-01-20 NOTE — Patient Instructions (Signed)
Diabetes and Foot Care Diabetes may cause you to have problems because of poor blood supply (circulation) to your feet and legs. This may cause the skin on your feet to become thinner, break easier, and heal more slowly. Your skin may become dry, and the skin may peel and crack. You may also have nerve damage in your legs and feet causing decreased feeling in them. You may not notice minor injuries to your feet that could lead to infections or more serious problems. Taking care of your feet is one of the most important things you can do for yourself.  HOME CARE INSTRUCTIONS  Wear shoes at all times, even in the house. Do not go barefoot. Bare feet are easily injured.  Check your feet daily for blisters, cuts, and redness. If you cannot see the bottom of your feet, use a mirror or ask someone for help.  Wash your feet with warm water (do not use hot water) and mild soap. Then pat your feet and the areas between your toes until they are completely dry. Do not soak your feet as this can dry your skin.  Apply a moisturizing lotion or petroleum jelly (that does not contain alcohol and is unscented) to the skin on your feet and to dry, brittle toenails. Do not apply lotion between your toes.  Trim your toenails straight across. Do not dig under them or around the cuticle. File the edges of your nails with an emery board or nail file.  Do not cut corns or calluses or try to remove them with medicine.  Wear clean socks or stockings every day. Make sure they are not too tight. Do not wear knee-high stockings since they may decrease blood flow to your legs.  Wear shoes that fit properly and have enough cushioning. To break in new shoes, wear them for just a few hours a day. This prevents you from injuring your feet. Always look in your shoes before you put them on to be sure there are no objects inside.  Do not cross your legs. This may decrease the blood flow to your feet.  If you find a minor scrape,  cut, or break in the skin on your feet, keep it and the skin around it clean and dry. These areas may be cleansed with mild soap and water. Do not cleanse the area with peroxide, alcohol, or iodine.  When you remove an adhesive bandage, be sure not to damage the skin around it.  If you have a wound, look at it several times a day to make sure it is healing.  Do not use heating pads or hot water bottles. They may burn your skin. If you have lost feeling in your feet or legs, you may not know it is happening until it is too late.  Make sure your health care provider performs a complete foot exam at least annually or more often if you have foot problems. Report any cuts, sores, or bruises to your health care provider immediately. SEEK MEDICAL CARE IF:   You have an injury that is not healing.  You have cuts or breaks in the skin.  You have an ingrown nail.  You notice redness on your legs or feet.  You feel burning or tingling in your legs or feet.  You have pain or cramps in your legs and feet.  Your legs or feet are numb.  Your feet always feel cold. SEEK IMMEDIATE MEDICAL CARE IF:   There is increasing redness,   swelling, or pain in or around a wound.  There is a red line that goes up your leg.  Pus is coming from a wound.  You develop a fever or as directed by your health care provider.  You notice a bad smell coming from an ulcer or wound. Document Released: 09/14/2000 Document Revised: 05/20/2013 Document Reviewed: 02/24/2013 [employer] Patient Information 2014 [employer].  

## 2014-01-21 NOTE — Progress Notes (Signed)
Subjective:     Patient ID: Alexis Singh, female   DOB: 04-22-1946, 69 y.o.   MRN: [id number]  HPI patient presents stating she is a diabetic and she cannot trim her toenails 22 ankle fracture and that while they're not painful they are thick   Review of Systems  All other systems reviewed and are negative.      Objective:   Physical Exam  Nursing note and vitals reviewed. Constitutional: She is oriented to person, place, and time.  Cardiovascular: Intact distal pulses.   Musculoskeletal: Normal range of motion.  Neurological: She is oriented to person, place, and time.  Skin: Skin is warm.   neurovascular status intact with range of motion subtalar midtarsal joint adequate and muscle strength within normal limits. 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 Patient's ankle is currently healing and she will be seen back as symptoms wart

## 2014-04-19 ENCOUNTER — Other Ambulatory Visit: Payer: Self-pay

## 2014-04-19 DIAGNOSIS — Z1231 Encounter for screening mammogram for malignant neoplasm of breast: Secondary | ICD-10-CM

## 2014-05-25 ENCOUNTER — Ambulatory Visit
Admission: RE | Admit: 2014-05-25 | Discharge: 2014-05-25 | Disposition: A | Discharge status: Home / Self Care | Payer: Commercial Managed Care - HMO | Source: Ambulatory Visit

## 2014-05-25 DIAGNOSIS — Z1231 Encounter for screening mammogram for malignant neoplasm of breast: Secondary | ICD-10-CM

## 2014-07-05 ENCOUNTER — Ambulatory Visit (INDEPENDENT_AMBULATORY_CARE_PROVIDER_SITE_OTHER): Payer: Commercial Managed Care - HMO | Admitting: Gynecology

## 2014-07-05 ENCOUNTER — Encounter: Payer: Self-pay | Admitting: Gynecology

## 2014-07-05 VITALS — BP 136/88 | Ht 64.0 in | Wt 181.0 lb

## 2014-07-05 DIAGNOSIS — Z853 Personal history of malignant neoplasm of breast: Secondary | ICD-10-CM

## 2014-07-05 DIAGNOSIS — Z23 Encounter for immunization: Secondary | ICD-10-CM

## 2014-07-05 DIAGNOSIS — L989 Disorder of the skin and subcutaneous tissue, unspecified: Secondary | ICD-10-CM

## 2014-07-05 DIAGNOSIS — Z1159 Encounter for screening for other viral diseases: Secondary | ICD-10-CM

## 2014-07-05 DIAGNOSIS — N952 Postmenopausal atrophic vaginitis: Secondary | ICD-10-CM

## 2014-07-05 DIAGNOSIS — M810 Age-related osteoporosis without current pathological fracture: Secondary | ICD-10-CM

## 2014-07-05 LAB — HEPATITIS C ANTIBODY: HCV Ab: NEGATIVE

## 2014-07-05 NOTE — Patient Instructions (Signed)

## 2014-07-05 NOTE — Progress Notes (Signed)
Alexis Singh [birth year] [id number]   History:    68 y.o.  for GYN exam and followup.Patient with past history of osteoporosis had been several years ago on Boniva and Evista. Her last bone density study was in 2010 and her lowest T score was the AP spine with a value -1.6.She is status post breast cancer of her left breast in 1994. She was treated with lumpectomy, chemotherapy and radiation. She had a normal mammogram this year.She is having no vaginal bleeding. She had a total abdominal hysterectomy, bilateral salpingo-oophorectomy for fibroids in 1990. She is having no pelvic pain. She has never had an abnormal Pap smear. Her last Pap smear was 2011. She does have atrophic vaginitis but does not have dyspareunia and therefore does not need treatment. She is having no urinary incontinence or dysuria.  Patient with history of diabetes mellitus, hypertension, hyperlipidemia, hyperthyroidism and glaucoma. 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 Patient reports her last colonoscopy was normal in 2011. Her PCP is Dr. Sinda Du. Entered endocrinologist is Dr. [name] who has been monitoring her diabetes and her hyperthyroidism as well as glaucoma and hypertension.  Patient requesting flu vaccine today  Past medical history,surgical history, family history and social history were all reviewed and documented in the EPIC chart.  Gynecologic History No LMP recorded. Patient has had a hysterectomy. Contraception: status post hysterectomy Last Pap: 2011. Results were: normal Last mammogram: 2015. Results were: normal  Obstetric History OB History  Gravida Para Term Preterm AB SAB TAB Ectopic Multiple Living  2 1 1  1     1     # Outcome Date GA Lbr Len/2nd Weight Sex Delivery Anes PTL Lv  2 ABT           1 TRM                ROS: A ROS was performed and pertinent positives and negatives are included in the history.  GENERAL: No fevers or  chills. HEENT: No change in vision, no earache, sore throat or sinus congestion. NECK: No pain or stiffness. CARDIOVASCULAR: No chest pain or pressure. No palpitations. PULMONARY: No shortness of breath, cough or wheeze. GASTROINTESTINAL: No abdominal pain, nausea, vomiting or diarrhea, melena or bright red blood per rectum. GENITOURINARY: No urinary frequency, urgency, hesitancy or dysuria. MUSCULOSKELETAL: No joint or muscle pain, no back pain, no recent trauma. DERMATOLOGIC: No rash, no itching, no lesions. ENDOCRINE: No polyuria, polydipsia, no heat or cold intolerance. No recent change in weight. HEMATOLOGICAL: No anemia or easy bruising or bleeding. NEUROLOGIC: No headache, seizures, numbness, tingling or weakness. PSYCHIATRIC: No depression, no loss of interest in normal activity or change in sleep pattern.     Exam: chaperone present  BP 136/88  Ht 5\' 4"  (1.626 m)  Wt 181 lb (82.101 kg)  BMI 31.05 kg/m2  Body mass index is 31.05 kg/(m^2).  General appearance : Well developed well nourished female. No acute distress HEENT: Neck supple, trachea midline, no carotid bruits, no thyroidmegaly Lungs: Clear to auscultation, no rhonchi or wheezes, or rib retractions  Heart: Regular rate and rhythm, no murmurs or gallops Breast:Examined in sitting and supine position were symmetrical in appearance, no palpable masses or tenderness,  no skin retraction, no nipple inversion, no nipple discharge, no skin discoloration, no axillary or supraclavicular lymphadenopathy Abdomen: no palpable masses or tenderness, no rebound or guarding Extremities: Inferior portion of the medial right thigh with hyper pigmented  raised lesion Pelvic:  Bartholin, Urethra, Skene Glands: Within normal limits             Vagina: No gross lesions or discharge, vaginal atrophy  Cervix: Absent  Uterus absent  Adnexa  Without masses or tenderness  Anus and perineum  normal   Rectovaginal  normal sphincter tone without  palpated masses or tenderness             Hemoccult PCP provides     Assessment/Plan:  68 y.o. female for GYN exam and followup was found to have a hyperpigmented lesion of the medial right thigh the lateral portion with irregular borders. Patient will return back to the office for biopsy in the next few weeks. Flu vaccine was given today. Her PCP has done her blood work we will be checking a hepatitis C as follows:  New CDC guidelines is recommending patients be tested once in her lifetime for hepatitis C antibody who were born between [birth year] through 1965. This was discussed with the patient today and has agreed to be tested today.  Patient with past history in 1994 of a left breast lumpectomy as a result of breast cancer. Patient has done well after her chemotherapy and radiation therapy. Her mammograms are up-to-date. She was once again reminded on the shingle vaccine. Pap smear not done today in accordance to the new guidelines.     Terrance Mass MD, 11:51 AM 07/05/2014

## 2014-07-11 IMAGING — CR DG HAND COMPLETE 3+V*L*
3 series · 3 of 3 positions shown · non-contrast
Comparison: None.

CLINICAL DATA: Fell and injured left hand.

EXAM:
LEFT HAND - COMPLETE 3+ VIEW

[view not recorded (1 of 3)]
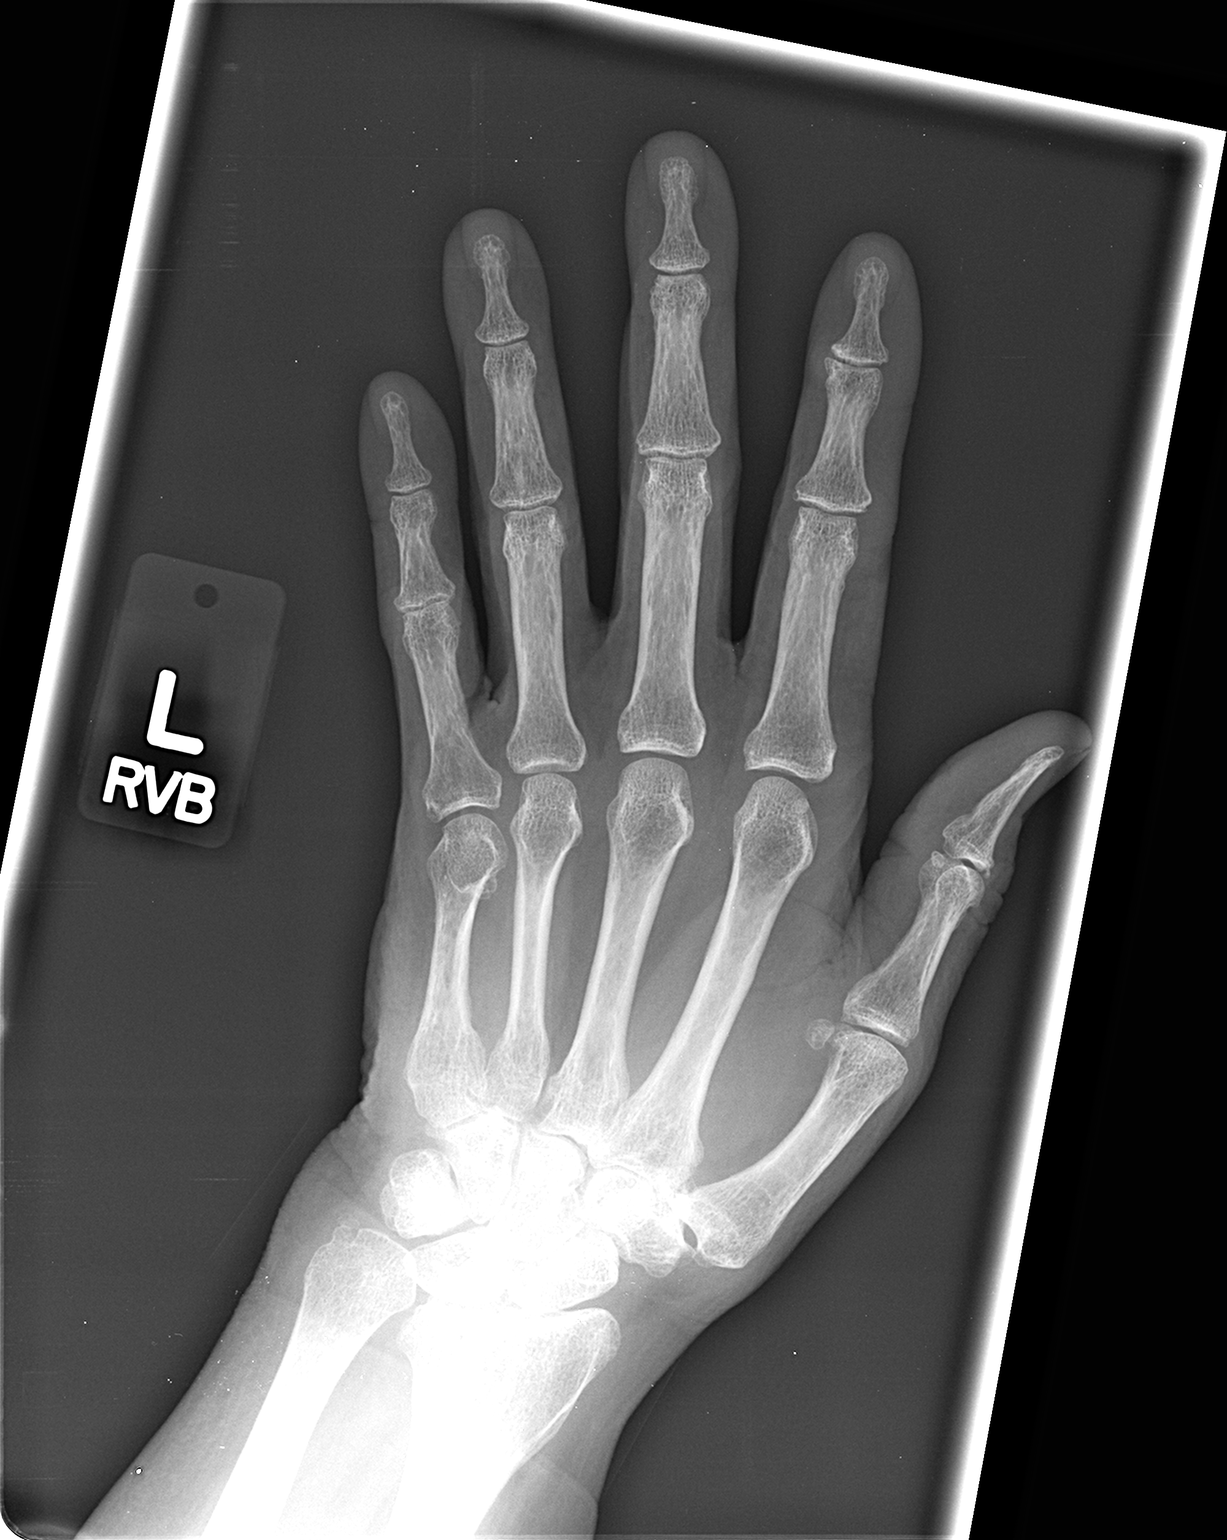

[view not recorded (2 of 3)]
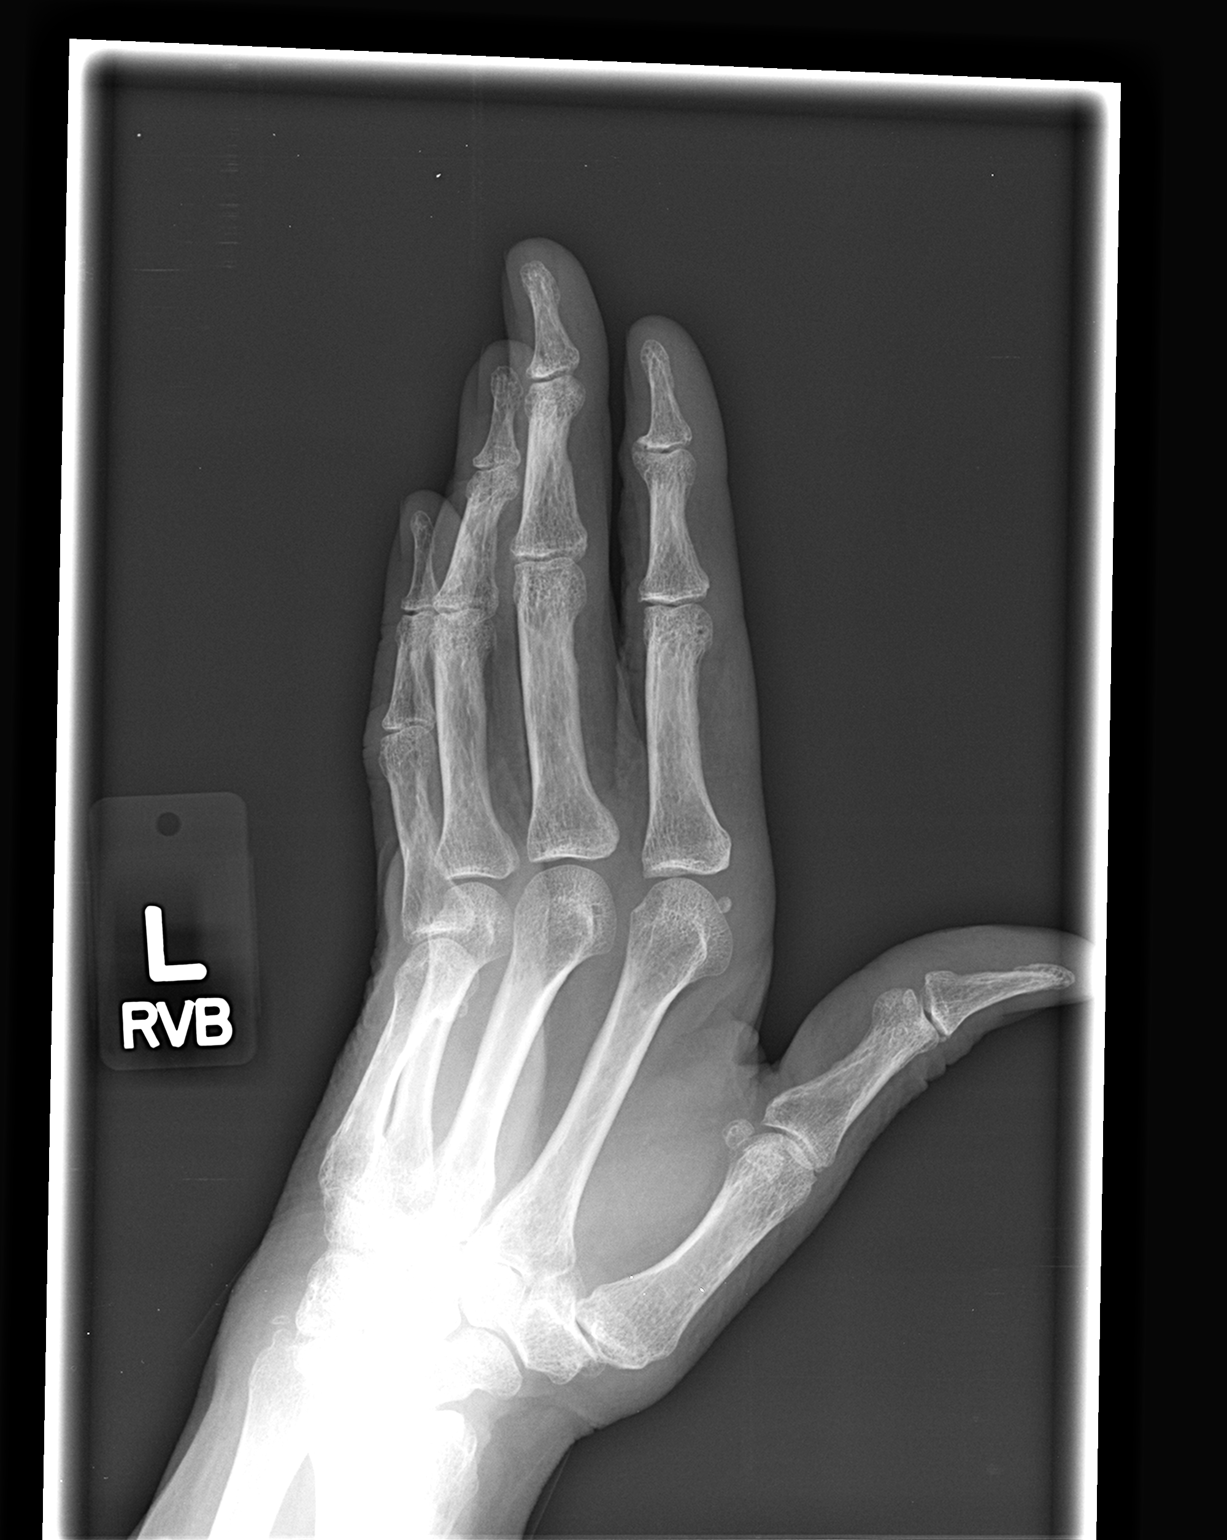

[view not recorded (3 of 3)]
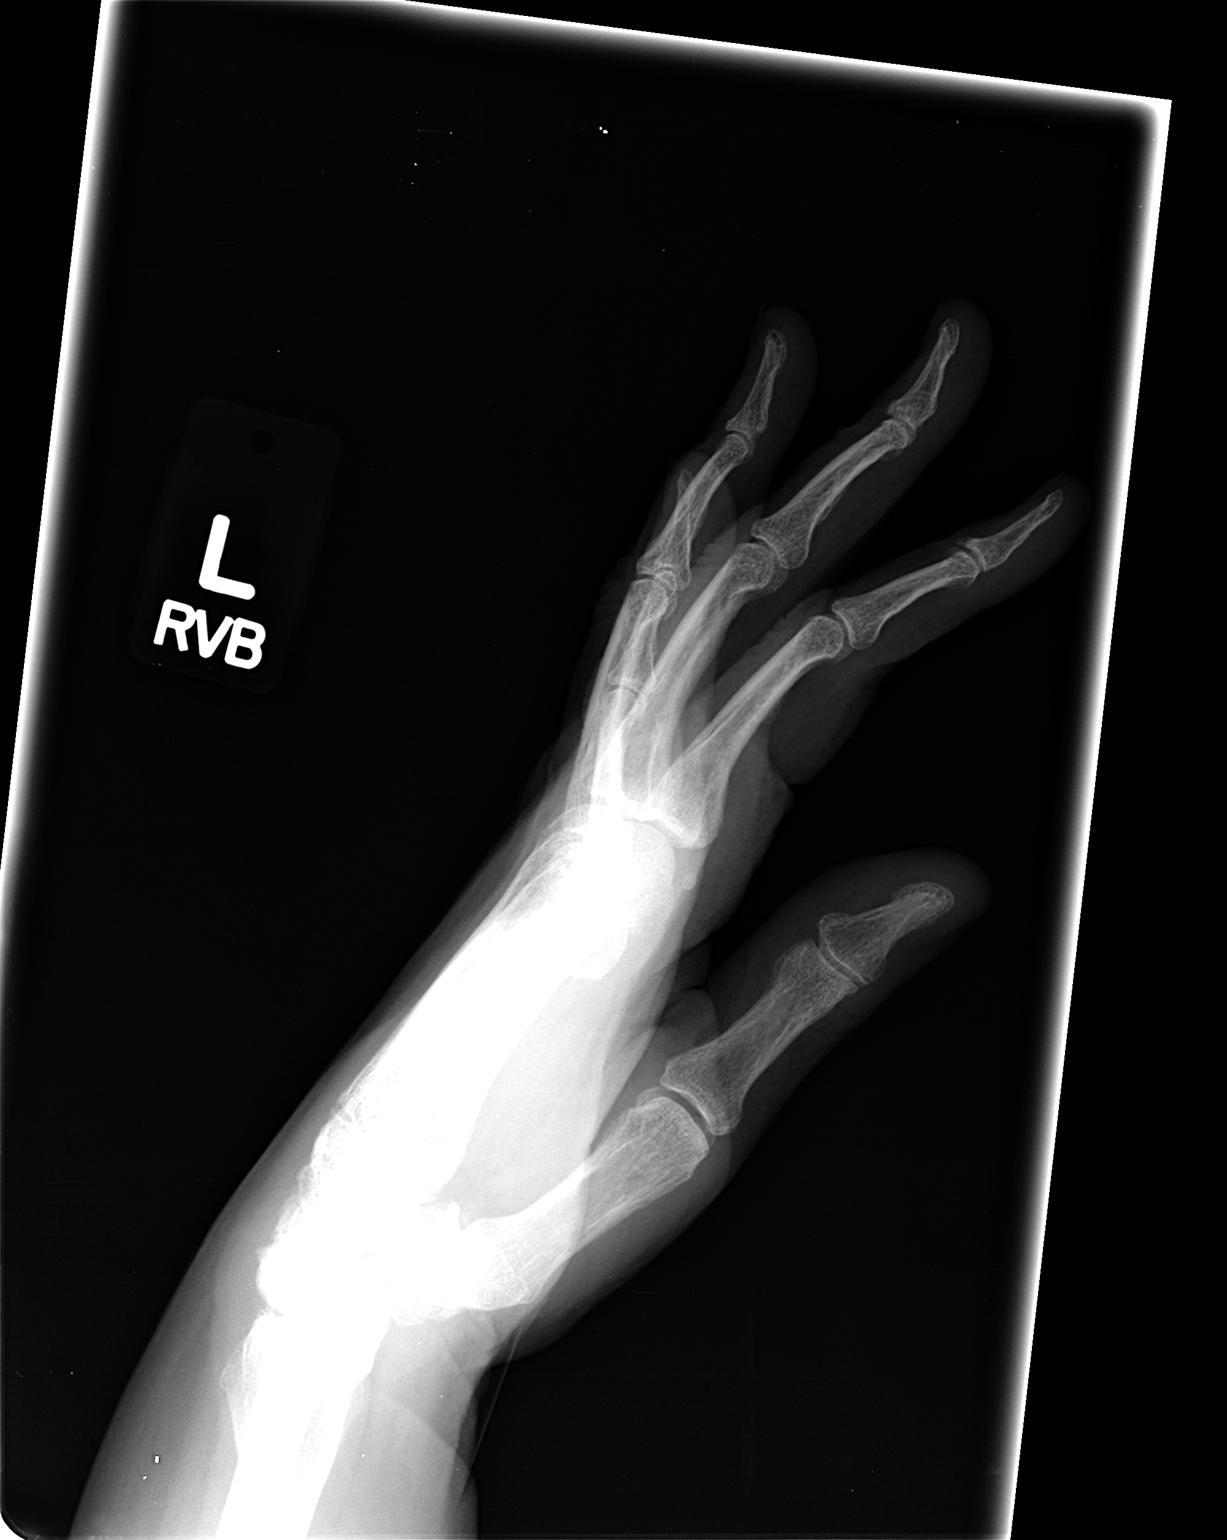

[3 of 3 positions shown; findings below may reference images not displayed]

FINDINGS: No evidence of acute fracture or dislocation. Mild osseous
demineralization. Mild to moderate joint space narrowing involving
IP joints of multiple fingers, the first and 5th MTP joints, and
several carpal-metacarpal joints.
IMPRESSION: No acute osseous abnormality.  Mild to moderate osteoarthritis.

## 2014-07-11 IMAGING — CR DG ANKLE 2V *R*
2 series · 2 of 2 positions shown · non-contrast
Comparison: Films performed earlier today

CLINICAL DATA: Reduction right ankle

EXAM:
RIGHT ANKLE - 2 VIEW

[ap]
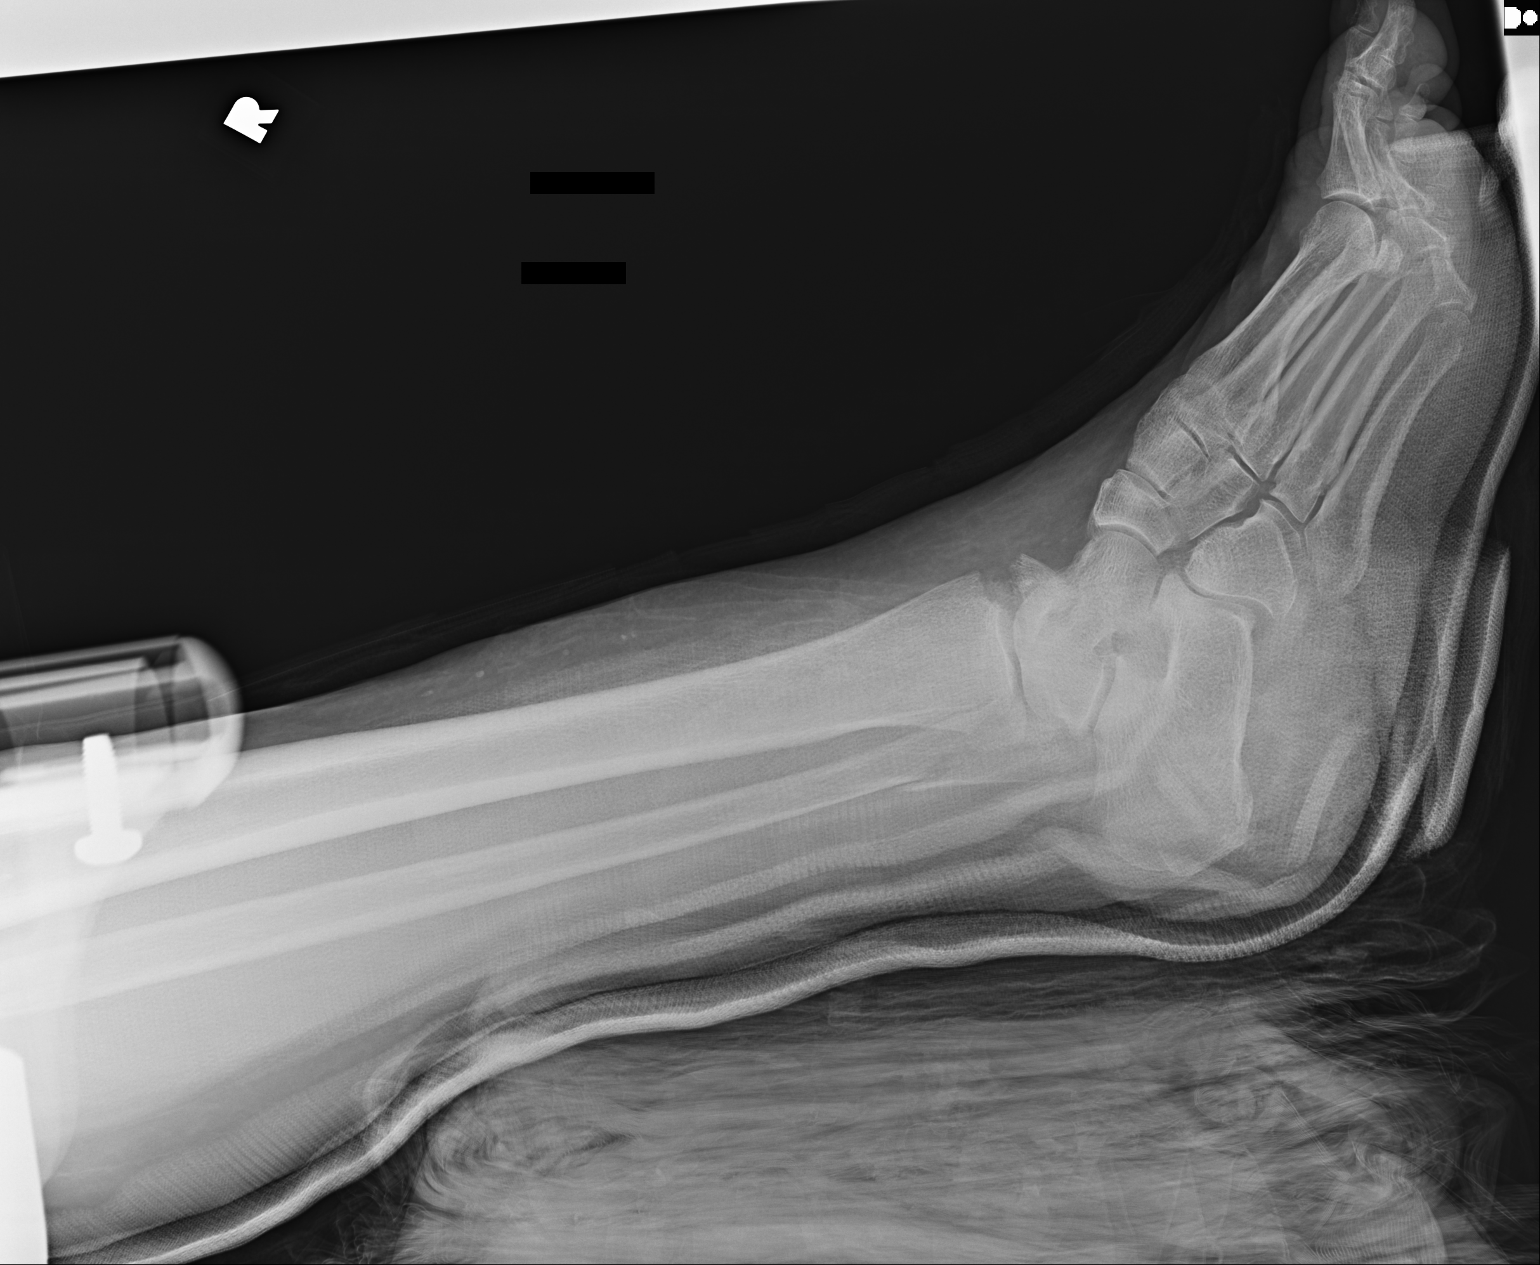

[lat]
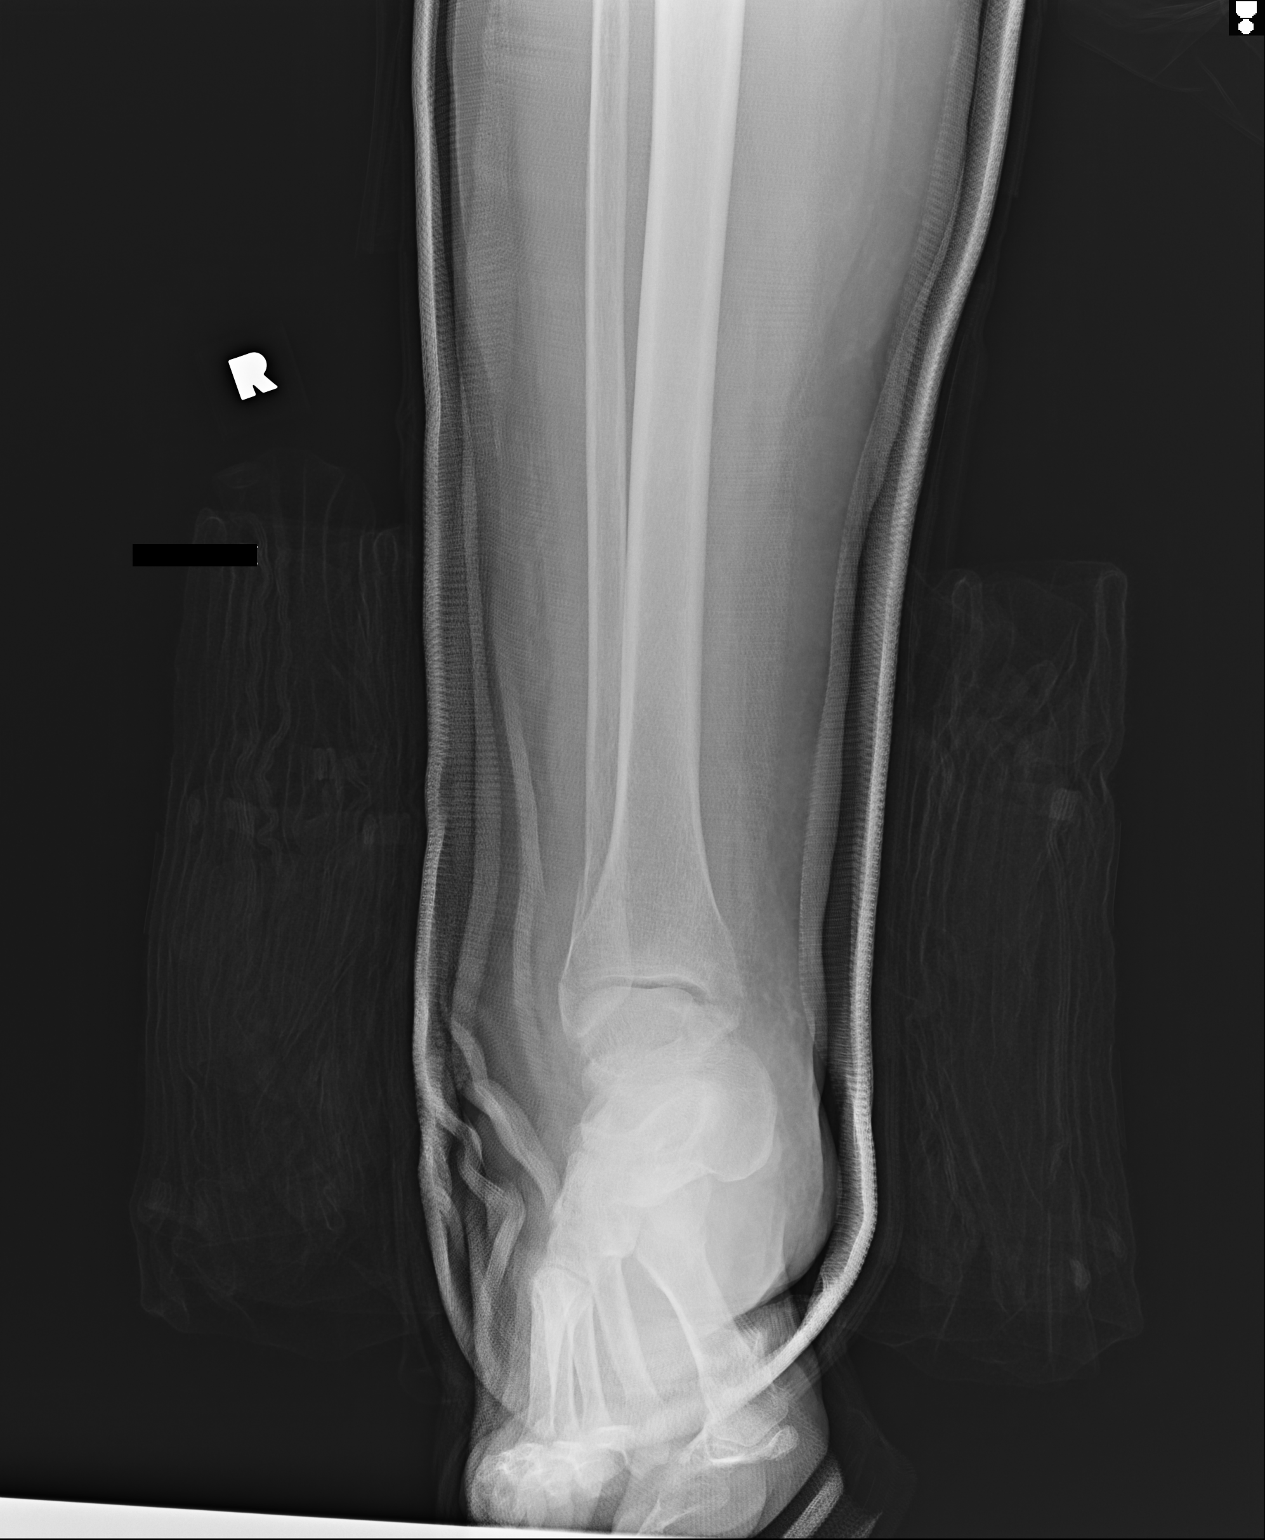

[2 of 2 positions shown; findings below may reference images not displayed]

FINDINGS: Try malleolar fracture again identified with posterior malleolar
component not well characterized on these films. Cast obscures bony
detail. Decreased displacement medial malleolar fracture noted.
Mildly decreased displacement of fibular fracture noted. Decreased
lateral subluxation of the talus relative to the tibia.
IMPRESSION: Limited bony detail. The visualized components of the trimalleolar
fracture show decreased displacement when compared to earlier
images.

## 2014-07-20 ENCOUNTER — Ambulatory Visit: Payer: Commercial Managed Care - HMO | Admitting: Gynecology

## 2014-08-02 ENCOUNTER — Encounter: Payer: Self-pay | Admitting: Gynecology

## 2014-08-13 ENCOUNTER — Ambulatory Visit (INDEPENDENT_AMBULATORY_CARE_PROVIDER_SITE_OTHER): Payer: Commercial Managed Care - HMO | Admitting: Gynecology

## 2014-08-13 ENCOUNTER — Encounter: Payer: Self-pay | Admitting: Gynecology

## 2014-08-13 ENCOUNTER — Other Ambulatory Visit: Payer: Self-pay | Admitting: Gynecology

## 2014-08-13 VITALS — BP 152/86

## 2014-08-13 DIAGNOSIS — L989 Disorder of the skin and subcutaneous tissue, unspecified: Secondary | ICD-10-CM

## 2014-08-13 MED ORDER — CEPHALEXIN 500 MG PO CAPS: 500.0000 mg | ORAL_CAPSULE | Freq: Four times a day (QID) | ORAL | Status: DC

## 2014-08-13 MED ORDER — HYDROCODONE-ACETAMINOPHEN 5-325 MG PO TABS: 1.0000 | ORAL_TABLET | Freq: Four times a day (QID) | ORAL | Status: DC | PRN

## 2014-08-13 NOTE — Progress Notes (Signed)
   Patient is a 67 year old was seen the office for GYN exam on 07/05/2014 and she was found to have a 2-1/2 cm hyperpigmented raised lesion inferior portion medial right thigh. She is here for biopsy.  Patient was counseled for wide local excision of right medial thigh lesion. The area was cleansed with Betadine solution. 1% lidocaine was infiltrated subcutaneously for approximately 8 cc. An elliptical incision was made and the lesion was excised and submitted for histological evaluation. Silver nitrate was used for hemostasis and the small vessels. The subcutaneous tissue was reapproximated with interrupted sutures of 2-0 Vicryl suture and the skin was reapproximated with 2-0 Vicryl suture interrupted as well as. Neosporin was applied and to Band-Aids.  Patient will be placed on cephalexin 500 mg 4 times a day for 10 days. She was prescribed Vicodin 5/300  by mouth every 6 hours when necessary pain.she will return back to the office in 10 days to have the suture removed. Instructions provided.

## 2014-08-13 NOTE — Addendum Note (Signed)
Addended by: Thurnell Garbe A on: 08/13/2014 12:17 PM   Modules accepted: Orders

## 2014-08-23 ENCOUNTER — Ambulatory Visit (INDEPENDENT_AMBULATORY_CARE_PROVIDER_SITE_OTHER): Payer: Commercial Managed Care - HMO | Admitting: Gynecology

## 2014-08-23 ENCOUNTER — Encounter: Payer: Self-pay | Admitting: Gynecology

## 2014-08-23 VITALS — BP 130/76

## 2014-08-23 DIAGNOSIS — L821 Other seborrheic keratosis: Secondary | ICD-10-CM | POA: Insufficient documentation

## 2014-08-23 DIAGNOSIS — Z09 Encounter for follow-up examination after completed treatment for conditions other than malignant neoplasm: Secondary | ICD-10-CM

## 2014-08-23 NOTE — Progress Notes (Signed)
   Patient presented to the office today for follow-up. Patient 10 days ago underwent excision of a right medial thigh hypopigmented raised lesion that measured 20 cm in size. She is doing well otherwise pathology report demonstrated the following:  Diagnosis Skin , right medial thigh SEBORRHEIC KERATOSIS, IRRITATED, LATERAL MARGIN INVOLVED, AND FOCAL ACANTHOLYTIC DYSKERATOSIS, LIMITED MARGINS FREE  Patient was started on cephalexin 500 mg 4 times a day for 10 days and will finish later today.  Exam: Right medial thigh suture still present nonerythematous mild induration but no tenderness elicited.  Assessment/plan: Patient with seborrheic keratosis of right medial thigh. We'll leave suture total next week before removal. Patient many irrigating use antibacterial septa the area and apply Neosporin twice a day. She will finish her antibiotic today.

## 2014-08-23 NOTE — Patient Instructions (Signed)
Seborrheic Keratosis Seborrheic keratosis is a common, noncancerous (benign) skin growth that can occur anywhere on the skin.It looks like "stuck-on," waxy, rough, tan, brown, or black spots on the skin. These skin growths can be flat or raised.They are often called "barnacles" because of their pasted-on appearance.Usually, these skin growths appear in adulthood, around age 68, and increase in number as you age. They may also develop during pregnancy or following estrogen therapy. Many people may only have one growth appear in their lifetime, while some people may develop many growths. CAUSES It is unknown what causes these skin growths, but they appear to run in families. SYMPTOMS Seborrheic keratosis is often located on the face, chest, shoulders, back, or other areas. These growths are:  Usually painless, but may become irritated and itchy.  Yellow, brown, black, or other colors.  Slightly raised or have a flat surface.  Sometimes rough or wart-like in texture.  Often waxy on the surface.  Round or oval-shaped.  Sometimes "stuck-on" in appearance.  Sometimes single, but there are usually many growths. Any growth that bleeds, itches on a regular basis, becomes inflamed, or becomes irritated needs to be evaluated by a skin specialist (dermatologist). DIAGNOSIS Diagnosis is mainly based on the way the growths appear. In some cases, it can be difficult to tell this type of skin growth from skin cancer. A skin growth tissue sample (biopsy) may be used to confirm the diagnosis. TREATMENT Most often, treatment is not needed because the skin growths are benign.If the skin growth is irritated easily by clothing or jewelry, causing it to scab or bleed, treatment may be recommended. Patients may also choose to have the growths removed because they do not like their appearance. Most commonly, these growths are treated with cryosurgery. In cryosurgery, liquid nitrogen is applied to "freeze" the  growth. The growth usually falls off within a matter of days. A blister may form and dry into a scab that will also fall off. After the growth or scab falls off, it may leave a dark or light spot on the skin. This color may fade over time, or it may remain permanent on the skin. HOME CARE INSTRUCTIONS If the skin growths are treated with cryosurgery, the treated area needs to be kept clean with water and soap. SEEK MEDICAL CARE IF:  You have questions about these growths or other skin problems.  You develop new symptoms, including:  A change in the appearance of the skin growth.  New growths.  Any bleeding, itching, or pain in the growths.  A skin growth that looks similar to seborrheic keratosis. Document Released: 10/20/2010 Document Revised: 12/10/2011 Document Reviewed: 10/20/2010 [employer] Patient Information 2015 [employer]. This information is not intended to replace advice given to you by your health care provider. Make sure you discuss any questions you have with your health care provider.  

## 2014-08-30 ENCOUNTER — Telehealth: Payer: Self-pay | Admitting: *Deleted

## 2014-08-30 NOTE — Telephone Encounter (Signed)
Inform patient not to put the Band-Aid she is probably getting a tape rash. Have her use soap and water to the area and then apply Neosporin at least 2-3 times a day. I will see her on Thursday but she would like for me to see her before that I will be morning right to see her

## 2014-08-30 NOTE — Telephone Encounter (Signed)
Pt called to follow up from [place] on 08/23/14. States she placed on bandage on site as directed and when changing bandage she noticed bumps where bandage was located. Pt said now she has clear drainage coming from the small bumps outside bx site. No painful, no bleeding, has follow up appointment on 09/02/14. Pt asked this okay? Okay to wait until thursday follow up OV. Please advise

## 2014-08-30 NOTE — Telephone Encounter (Signed)
Pt informed with the below note. 

## 2014-09-02 ENCOUNTER — Encounter: Payer: Self-pay | Admitting: Gynecology

## 2014-09-02 ENCOUNTER — Ambulatory Visit (INDEPENDENT_AMBULATORY_CARE_PROVIDER_SITE_OTHER): Payer: Commercial Managed Care - HMO | Admitting: Gynecology

## 2014-09-02 VITALS — BP 140/80

## 2014-09-02 DIAGNOSIS — Z09 Encounter for follow-up examination after completed treatment for conditions other than malignant neoplasm: Secondary | ICD-10-CM

## 2014-09-02 NOTE — Progress Notes (Signed)
   Patient presented to the office for postop visit. Review of her record indicated that on 08/13/2014 she had a wide local excision of the right medial thigh lesion and was here to have her sutures removed. Her pathology report had demonstrated the following:  Diagnosis Skin , right medial thigh SEBORRHEIC KERATOSIS, IRRITATED, LATERAL MARGIN INVOLVED, AND FOCAL ACANTHOLYTIC DYSKERATOSIS, LIMITED MARGINS FREE  Patient was started on cephalexin 500 mg 4 times a day for 10 days which she was seen in the office on November 23 for follow-up visit.  Exam: Sutures were removed today. Patient had developed a tape rash. Scabs and started to form and the sutures were removed and the area was debrided with hydrogen peroxide and Neosporin was applied.  Since part of the wide local excision approximation size of slightly separated I'm going to place her on Vibramycin 100 mg twice a day for 2 weeks. I'll last her to do irrigation to the area with her showerhead hand-held twice a day application of Neosporin. She'll return back to the office in 6 weeks for postop visit.

## 2014-09-03 ENCOUNTER — Telehealth: Payer: Self-pay | Admitting: *Deleted

## 2014-09-03 MED ORDER — DOXYCYCLINE HYCLATE 100 MG PO CAPS: 100.0000 mg | ORAL_CAPSULE | Freq: Two times a day (BID) | ORAL | Status: DC

## 2014-09-03 NOTE — Telephone Encounter (Signed)
Pt called stating Rx from [facility] was not sent to pharmacy vibramycin 100 mg twice BID x 2 weeks. Rx sent to pharmacy pt informed.

## 2014-09-20 ENCOUNTER — Other Ambulatory Visit (HOSPITAL_COMMUNITY): Payer: Self-pay | Admitting: Pulmonary Disease

## 2014-09-20 DIAGNOSIS — R109 Unspecified abdominal pain: Secondary | ICD-10-CM

## 2014-09-22 ENCOUNTER — Ambulatory Visit (HOSPITAL_COMMUNITY): Payer: Commercial Managed Care - HMO

## 2014-09-27 ENCOUNTER — Ambulatory Visit (HOSPITAL_COMMUNITY)
Admission: RE | Admit: 2014-09-27 | Discharge: 2014-09-27 | Disposition: A | Discharge status: Home / Self Care | Payer: Commercial Managed Care - HMO | Source: Ambulatory Visit | Attending: Pulmonary Disease | Admitting: Pulmonary Disease

## 2014-09-27 DIAGNOSIS — Z853 Personal history of malignant neoplasm of breast: Secondary | ICD-10-CM | POA: Insufficient documentation

## 2014-09-27 DIAGNOSIS — Z90710 Acquired absence of both cervix and uterus: Secondary | ICD-10-CM | POA: Insufficient documentation

## 2014-09-27 DIAGNOSIS — N281 Cyst of kidney, acquired: Secondary | ICD-10-CM | POA: Insufficient documentation

## 2014-09-27 DIAGNOSIS — K7689 Other specified diseases of liver: Secondary | ICD-10-CM | POA: Diagnosis not present

## 2014-09-27 DIAGNOSIS — I10 Essential (primary) hypertension: Secondary | ICD-10-CM | POA: Insufficient documentation

## 2014-09-27 DIAGNOSIS — R937 Abnormal findings on diagnostic imaging of other parts of musculoskeletal system: Secondary | ICD-10-CM | POA: Diagnosis not present

## 2014-09-27 DIAGNOSIS — Z9049 Acquired absence of other specified parts of digestive tract: Secondary | ICD-10-CM | POA: Diagnosis not present

## 2014-09-27 DIAGNOSIS — R160 Hepatomegaly, not elsewhere classified: Secondary | ICD-10-CM | POA: Insufficient documentation

## 2014-09-27 DIAGNOSIS — J8410 Pulmonary fibrosis, unspecified: Secondary | ICD-10-CM | POA: Diagnosis not present

## 2014-09-27 DIAGNOSIS — R110 Nausea: Secondary | ICD-10-CM | POA: Diagnosis not present

## 2014-09-27 DIAGNOSIS — N200 Calculus of kidney: Secondary | ICD-10-CM | POA: Diagnosis not present

## 2014-09-27 DIAGNOSIS — I700 Atherosclerosis of aorta: Secondary | ICD-10-CM | POA: Insufficient documentation

## 2014-09-27 DIAGNOSIS — R109 Unspecified abdominal pain: Secondary | ICD-10-CM

## 2014-09-27 DIAGNOSIS — E119 Type 2 diabetes mellitus without complications: Secondary | ICD-10-CM | POA: Diagnosis not present

## 2014-09-27 LAB — POCT I-STAT CREATININE: Creatinine, Ser: 1.1 mg/dL (ref 0.50–1.10)

## 2014-09-27 MED ORDER — IOHEXOL 300 MG/ML  SOLN
100.0000 mL | Freq: Once | INTRAMUSCULAR | Status: AC | PRN
  Administered 2014-09-27: 100 mL via INTRAVENOUS

## 2014-10-02 ENCOUNTER — Emergency Department (HOSPITAL_COMMUNITY)
Admission: EM | Admit: 2014-10-02 | Discharge: 2014-10-02 | Disposition: A | Discharge status: Home / Self Care | Payer: Commercial Managed Care - HMO | Attending: Emergency Medicine | Admitting: Emergency Medicine

## 2014-10-02 ENCOUNTER — Encounter (HOSPITAL_COMMUNITY): Payer: Self-pay | Admitting: Emergency Medicine

## 2014-10-02 DIAGNOSIS — Z9851 Tubal ligation status: Secondary | ICD-10-CM | POA: Insufficient documentation

## 2014-10-02 DIAGNOSIS — R1030 Lower abdominal pain, unspecified: Secondary | ICD-10-CM | POA: Diagnosis not present

## 2014-10-02 DIAGNOSIS — E785 Hyperlipidemia, unspecified: Secondary | ICD-10-CM | POA: Insufficient documentation

## 2014-10-02 DIAGNOSIS — Z792 Long term (current) use of antibiotics: Secondary | ICD-10-CM | POA: Diagnosis not present

## 2014-10-02 DIAGNOSIS — E0590 Thyrotoxicosis, unspecified without thyrotoxic crisis or storm: Secondary | ICD-10-CM | POA: Insufficient documentation

## 2014-10-02 DIAGNOSIS — Z79899 Other long term (current) drug therapy: Secondary | ICD-10-CM | POA: Insufficient documentation

## 2014-10-02 DIAGNOSIS — I10 Essential (primary) hypertension: Secondary | ICD-10-CM | POA: Diagnosis not present

## 2014-10-02 DIAGNOSIS — R112 Nausea with vomiting, unspecified: Secondary | ICD-10-CM | POA: Insufficient documentation

## 2014-10-02 DIAGNOSIS — Z8719 Personal history of other diseases of the digestive system: Secondary | ICD-10-CM | POA: Diagnosis not present

## 2014-10-02 DIAGNOSIS — E669 Obesity, unspecified: Secondary | ICD-10-CM | POA: Insufficient documentation

## 2014-10-02 DIAGNOSIS — Z9049 Acquired absence of other specified parts of digestive tract: Secondary | ICD-10-CM | POA: Insufficient documentation

## 2014-10-02 DIAGNOSIS — E119 Type 2 diabetes mellitus without complications: Secondary | ICD-10-CM | POA: Diagnosis not present

## 2014-10-02 DIAGNOSIS — D649 Anemia, unspecified: Secondary | ICD-10-CM | POA: Diagnosis not present

## 2014-10-02 DIAGNOSIS — R197 Diarrhea, unspecified: Secondary | ICD-10-CM | POA: Insufficient documentation

## 2014-10-02 DIAGNOSIS — Z853 Personal history of malignant neoplasm of breast: Secondary | ICD-10-CM | POA: Diagnosis not present

## 2014-10-02 DIAGNOSIS — M1990 Unspecified osteoarthritis, unspecified site: Secondary | ICD-10-CM | POA: Diagnosis not present

## 2014-10-02 DIAGNOSIS — R1033 Periumbilical pain: Secondary | ICD-10-CM | POA: Insufficient documentation

## 2014-10-02 DIAGNOSIS — Z7952 Long term (current) use of systemic steroids: Secondary | ICD-10-CM | POA: Diagnosis not present

## 2014-10-02 DIAGNOSIS — H409 Unspecified glaucoma: Secondary | ICD-10-CM | POA: Insufficient documentation

## 2014-10-02 DIAGNOSIS — Z8742 Personal history of other diseases of the female genital tract: Secondary | ICD-10-CM | POA: Diagnosis not present

## 2014-10-02 DIAGNOSIS — N289 Disorder of kidney and ureter, unspecified: Secondary | ICD-10-CM | POA: Diagnosis not present

## 2014-10-02 DIAGNOSIS — Z90710 Acquired absence of both cervix and uterus: Secondary | ICD-10-CM | POA: Diagnosis not present

## 2014-10-02 DIAGNOSIS — M810 Age-related osteoporosis without current pathological fracture: Secondary | ICD-10-CM | POA: Insufficient documentation

## 2014-10-02 DIAGNOSIS — E1165 Type 2 diabetes mellitus with hyperglycemia: Secondary | ICD-10-CM | POA: Diagnosis not present

## 2014-10-02 DIAGNOSIS — Z7982 Long term (current) use of aspirin: Secondary | ICD-10-CM | POA: Diagnosis not present

## 2014-10-02 LAB — CBC WITH DIFFERENTIAL/PLATELET
Basophils Absolute: 0 10*3/uL (ref 0.0–0.1)
Basophils Relative: 0 % (ref 0–1)
Eosinophils Absolute: 0.1 10*3/uL (ref 0.0–0.7)
Eosinophils Relative: 3 % (ref 0–5)
HCT: 33 % — ABNORMAL LOW (ref 36.0–46.0)
HEMOGLOBIN: 10.9 g/dL — AB (ref 12.0–15.0)
LYMPHS ABS: 1.5 10*3/uL (ref 0.7–4.0)
LYMPHS PCT: 33 % (ref 12–46)
MCH: 28.8 pg (ref 26.0–34.0)
MCHC: 33 g/dL (ref 30.0–36.0)
MCV: 87.1 fL (ref 78.0–100.0)
MONOS PCT: 9 % (ref 3–12)
Monocytes Absolute: 0.4 10*3/uL (ref 0.1–1.0)
NEUTROS PCT: 55 % (ref 43–77)
Neutro Abs: 2.5 10*3/uL (ref 1.7–7.7)
PLATELETS: 234 10*3/uL (ref 150–400)
RBC: 3.79 MIL/uL — AB (ref 3.87–5.11)
RDW: 13 % (ref 11.5–15.5)
WBC: 4.6 10*3/uL (ref 4.0–10.5)

## 2014-10-02 LAB — BASIC METABOLIC PANEL
Anion gap: 6 (ref 5–15)
BUN: 24 mg/dL — AB (ref 6–23)
CO2: 27 mmol/L (ref 19–32)
Calcium: 9.2 mg/dL (ref 8.4–10.5)
Chloride: 104 mEq/L (ref 96–112)
Creatinine, Ser: 1.69 mg/dL — ABNORMAL HIGH (ref 0.50–1.10)
GFR calc Af Amer: 35 mL/min — ABNORMAL LOW (ref >90)
GFR calc non Af Amer: 30 mL/min — ABNORMAL LOW (ref >90)
GLUCOSE: 152 mg/dL — AB (ref 70–99)
POTASSIUM: 3.7 mmol/L (ref 3.5–5.1)
Sodium: 137 mmol/L (ref 135–145)

## 2014-10-02 MED ORDER — SODIUM CHLORIDE 0.9 % IV BOLUS (SEPSIS)
1000.0000 mL | Freq: Once | INTRAVENOUS | Status: AC
  Administered 2014-10-02: 1000 mL via INTRAVENOUS

## 2014-10-02 MED ORDER — ONDANSETRON HCL 8 MG PO TABS: 8.0000 mg | ORAL_TABLET | Freq: Three times a day (TID) | ORAL | Status: DC | PRN

## 2014-10-02 MED ORDER — ONDANSETRON HCL 4 MG/2ML IJ SOLN
4.0000 mg | Freq: Once | INTRAMUSCULAR | Status: AC
  Administered 2014-10-02: 4 mg via INTRAMUSCULAR
  Filled 2014-10-02: qty 2

## 2014-10-02 MED ORDER — MORPHINE SULFATE 4 MG/ML IJ SOLN
4.0000 mg | Freq: Once | INTRAMUSCULAR | Status: AC
  Administered 2014-10-02: 4 mg via INTRAVENOUS
  Filled 2014-10-02: qty 1

## 2014-10-02 MED ORDER — HYDROCODONE-ACETAMINOPHEN 5-325 MG PO TABS: 1.0000 | ORAL_TABLET | Freq: Four times a day (QID) | ORAL | Status: DC | PRN

## 2014-10-02 NOTE — ED Notes (Signed)
Patient c/o lower abd pain with nausea, vomiting, and diarrhea. Unsure of any fevers, denies any urinary symptoms. Per patient started after eating last night, she had loose BM, then pain started. Per patient no blood noted.

## 2014-10-02 NOTE — ED Notes (Signed)
Patient reports having CT scan Monday, ordered by PCP for constant nausea.

## 2014-10-02 NOTE — ED Provider Notes (Signed)
CSN: [id number]     Arrival date & time 10/02/14  7124 History  This chart was scribed for Alexis Dakin, MD by Stephania Fragmin, ED Scribe. This patient was seen in room APA05/APA05 and the patient's care was started at 9:51 AM.    Chief Complaint  Patient presents with  . Abdominal Pain   The history is provided by the patient. No language interpreter was used.     HPI Comments: Alexis Singh is a 69 y.o. female who presents to the Emergency Department complaining of gradual onset, constant  Crampylower abdominal pain that occurred about 14 hours ago when patient was at dinner. She also c/o associated 4 episodes of diarrhea and some lightheadedness. Patient presently feels lower abdominal pain radiating to her back. Patient had a CT abdomen, ordered by her PCP Dr. Luan Pulling, done 09/27/14 which showed no acute process ago for nausea triggered by eating that began 2-3 months ago; the test returned negative. Patient denies trying any medication for her pain. Walking and moving alleviates it somewhat. She denies any other modifying factors. She denies blood in her stool and dysuria. She would like pain medication presently. No treatment prior to coming here. No urinary symptoms. No other associated symptoms  Past Medical History  Diagnosis Date  . Glaucoma   . Obesity   . Hyperlipidemia   . Thyroid disease     overactive thyroid  . Diabetes mellitus   . Cancer 1994    left breast  . Osteoporosis   . Pituitary tumor   . Hypertension   . Fibroid   . PONV (postoperative nausea and vomiting)   . Shortness of breath   . Blood in urine   . GERD (gastroesophageal reflux disease)   . Headache(784.0)   . Arthritis   . Anemia   . Hyperthyroidism    Past Surgical History  Procedure Laterality Date  . Breast lumpectomy      with removal of lymph nodes/had chemo and radiation  . Pituitary tumor removal  [place] [facility]  . Tubal ligation    . Abdominal hysterectomy      [facility] BSO  . Toe surgery     . Cholecystectomy    . Eye surgery      Laser  . Oophorectomy      BSO  . Wrist fracture surgery    . Vein procedure    . Left wrist surgery    . Orif ankle fracture Right 11/12/2013    Procedure: OPEN REDUCTION INTERNAL FIXATION (ORIF) RIGHT ANKLE FRACTURE;  Surgeon: Wylene Simmer, MD;  Location: [facility];  Service: Orthopedics;  Laterality: Right;   Family History  Problem Relation Age of Onset  . Breast cancer Mother     Age 62  . Hypertension Mother   . Heart failure Father   . Hypertension Sister   . Diabetes Brother   . Hypertension Brother   . Cancer Brother     Unsure what type   History  Substance Use Topics  . Smoking status: Never Smoker   . Smokeless tobacco: Never Used  . Alcohol Use: No   OB History    Gravida Para Term Preterm AB TAB SAB Ectopic Multiple Living   2 1 1  1     1      Review of Systems  Constitutional: Negative.   HENT: Negative.   Respiratory: Negative.   Cardiovascular: Negative.   Gastrointestinal: Positive for nausea, vomiting, abdominal pain and diarrhea.  Musculoskeletal: Negative.  Skin: Negative.   Neurological: Negative.   Psychiatric/Behavioral: Negative.   All other systems reviewed and are negative.     Allergies  Ambien; Ciprofloxacin hcl; and Codeine  Home Medications   Prior to Admission medications   Medication Sig Start Date End Date Taking? Authorizing Provider  amLODipine (NORVASC) 5 MG tablet Take 5 mg by mouth daily. 12/08/13   Historical Provider, MD  aspirin 325 MG tablet Take 325 mg by mouth daily.    Historical Provider, MD  atorvastatin (LIPITOR) 40 MG tablet Take 40 mg by mouth at bedtime.     Historical Provider, MD  brimonidine (ALPHAGAN P) 0.1 % SOLN Place 1 drop into both eyes 2 (two) times daily.     Historical Provider, MD  cephALEXin (KEFLEX) 500 MG capsule Take 1 capsule (500 mg total) by mouth 4 (four) times daily. 08/13/14   Terrance Mass, MD  cholecalciferol (VITAMIN D) 1000 UNITS tablet  Take 1,000 Units by mouth daily.    Historical Provider, MD  dorzolamide-timolol (COSOPT) 22.3-6.8 MG/ML ophthalmic solution Place 1 drop into both eyes 2 (two) times daily.      Historical Provider, MD  doxycycline (VIBRAMYCIN) 100 MG capsule Take 1 capsule (100 mg total) by mouth 2 (two) times daily. 09/03/14   Terrance Mass, MD  ferrous sulfate 325 (65 FE) MG tablet Take 325 mg by mouth daily.    Historical Provider, MD  glipiZIDE (GLUCOTROL XL) 2.5 MG 24 hr tablet Take 2.5 mg by mouth daily.    Historical Provider, MD  HYDROcodone-acetaminophen (NORCO/VICODIN) 5-325 MG per tablet Take 1 tablet by mouth every 6 (six) hours as needed (for pain). 08/13/14   Terrance Mass, MD  hydrocortisone (CORTEF) 10 MG tablet Take 5-15 mg by mouth daily. Patient takes 15mg  in the morning and 5mg  in the afternoon    Historical Provider, MD  latanoprost (XALATAN) 0.005 % ophthalmic solution Place 1 drop into both eyes at bedtime.    Historical Provider, MD  levothyroxine (SYNTHROID, LEVOTHROID) 88 MCG tablet Take 88 mcg by mouth daily before breakfast.    Historical Provider, MD  losartan (COZAAR) 100 MG tablet Take 100 mg by mouth daily.      Historical Provider, MD  ondansetron (ZOFRAN) 4 MG tablet Take 4 mg by mouth every 6 (six) hours as needed for nausea or vomiting.    Historical Provider, MD   BP 162/92 mmHg  Pulse 79  Temp(Src) 99 F (37.2 C) (Oral)  Resp 18  Ht 5\' 4"  (1.626 m)  Wt 178 lb (80.74 kg)  BMI 30.54 kg/m2  SpO2 100% Physical Exam  Constitutional: She appears well-developed and well-nourished.  HENT:  Head: Normocephalic and atraumatic.  Eyes: Conjunctivae are normal. Pupils are equal, round, and reactive to light.  Neck: Neck supple. No tracheal deviation present. No thyromegaly present.  Cardiovascular: Normal rate and regular rhythm.   No murmur heard. Pulmonary/Chest: Effort normal and breath sounds normal.  Abdominal: Soft. Bowel sounds are normal. She exhibits no  distension. There is tenderness.  Mild tenderness at the periumbilical area  Musculoskeletal: Normal range of motion. She exhibits no edema or tenderness.  Neurological: She is alert. Coordination normal.  Skin: Skin is warm and dry. No rash noted.  Psychiatric: She has a normal mood and affect.  Nursing note and vitals reviewed.   ED Course  Procedures (including critical care time)  DIAGNOSTIC STUDIES: Oxygen Saturation is 100% on room air, normal by my interpretation.    COORDINATION  OF CARE: 9:58 AM - Discussed treatment plan with pt at bedside.   Labs Review Labs Reviewed - No data to display  Imaging Review No results found.   EKG Interpretation None     10:40 AM patient reports pain and nausea under control after treatment with intravenous fluids, opioids and antiemetics 1230 p.m. patient feels ready to go home Results for orders placed or performed during the hospital encounter of 10/02/14  CBC with Differential  Result Value Ref Range   WBC 4.6 4.0 - 10.5 K/uL   RBC 3.79 (L) 3.87 - 5.11 MIL/uL   Hemoglobin 10.9 (L) 12.0 - 15.0 g/dL   HCT 33.0 (L) 36.0 - 46.0 %   MCV 87.1 78.0 - 100.0 fL   MCH 28.8 26.0 - 34.0 pg   MCHC 33.0 30.0 - 36.0 g/dL   RDW 13.0 11.5 - 15.5 %   Platelets 234 150 - 400 K/uL   Neutrophils Relative % 55 43 - 77 %   Neutro Abs 2.5 1.7 - 7.7 K/uL   Lymphocytes Relative 33 12 - 46 %   Lymphs Abs 1.5 0.7 - 4.0 K/uL   Monocytes Relative 9 3 - 12 %   Monocytes Absolute 0.4 0.1 - 1.0 K/uL   Eosinophils Relative 3 0 - 5 %   Eosinophils Absolute 0.1 0.0 - 0.7 K/uL   Basophils Relative 0 0 - 1 %   Basophils Absolute 0.0 0.0 - 0.1 K/uL  Basic metabolic panel  Result Value Ref Range   Sodium 137 135 - 145 mmol/L   Potassium 3.7 3.5 - 5.1 mmol/L   Chloride 104 96 - 112 mEq/L   CO2 27 19 - 32 mmol/L   Glucose, Bld 152 (H) 70 - 99 mg/dL   BUN 24 (H) 6 - 23 mg/dL   Creatinine, Ser 1.69 (H) 0.50 - 1.10 mg/dL   Calcium 9.2 8.4 - 10.5 mg/dL    GFR calc non Af Amer 30 (L) >90 mL/min   GFR calc Af Amer 35 (L) >90 mL/min   Anion gap 6 5 - 15   Ct Abdomen Pelvis W Contrast  09/27/2014   CLINICAL DATA:  Progressive postprandial nausea for 6 months. History of cholecystectomy, hysterectomy, diabetes and hypertension. History of left breast cancer. Initial encounter.  EXAM: CT ABDOMEN AND PELVIS WITH CONTRAST  TECHNIQUE: Multidetector CT imaging of the abdomen and pelvis was performed using the standard protocol following bolus administration of intravenous contrast.  CONTRAST:  174mL OMNIPAQUE IOHEXOL 300 MG/ML  SOLN  COMPARISON:  Abdominal pelvic CT 12/18/2013 and 07/19/2010.  FINDINGS: Lower chest: There is a stable calcified right lower lobe granuloma. The lung bases are otherwise clear. There is no pleural or pericardial effusion.  Hepatobiliary: Hepatomegaly and innumerable hepatic cysts are again noted. Some of the cysts are complicated by increased density and peripheral calcifications. The overall appearance is similar to the prior study. The gallbladder is surgically absent. There is no significant biliary dilatation.  Pancreas: Unremarkable. No pancreatic ductal dilatation or surrounding inflammatory changes.  Spleen: Normal in size without focal abnormality.  Adrenals/Urinary Tract: Both adrenal glands appear normal.Multiple renal cysts are again noted. There is mild renal cortical thinning bilaterally and a stable nonobstructing calculus in the mid right kidney. There is no evidence of ureteral calculus or hydronephrosis. The bladder appears unremarkable.  Stomach/Bowel: No evidence of bowel wall thickening, distention or surrounding inflammatory change.The appendix appears normal. There is mildly increased stool within the distal colon.  Vascular/Lymphatic: There are  no enlarged abdominal or pelvic lymph nodes. There is mild aortoiliac atherosclerosis.  Reproductive: Status post hysterectomy. No evidence of adnexal mass.  Other: No evidence  of abdominal wall mass or hernia.  Musculoskeletal: No acute osseous findings are demonstrated. Both femoral heads demonstrate some subchondral sclerosis which is primarily posteriorly positioned, an atypical location for avascular necrosis. There is no subchondral collapse or significant change.  IMPRESSION: 1. No acute abdominal pelvic findings or explanation for the patient's symptoms. 2. Innumerable hepatic and renal cysts are again noted with little change compared with the prior study. 3. Nonobstructing right renal calculus. 4. Stable chronic mild sclerosis of both femoral heads, likely degenerative or atypical avascular necrosis.   Electronically Signed   By: Camie Patience M.D.   On: 09/27/2014 10:18    MDM   Final diagnoses:  None   patient with mild renal insufficiency may be secondary to IV contrast from prior CT scan Plan encourage oral hydration.anemia is chronic. Contact Dr Luan Pulling in 2 days for re-exam this week Rx norco, zofran Dx #1 abdominal pain #2 renal insufficiency #3hyperglycemia #4 anemia       Alexis Dakin, MD 10/02/14 1236

## 2014-10-02 NOTE — Discharge Instructions (Signed)
Abdominal Pain Your blood creatinine is 1.69 today which shows that your kidney function is slightly worse over the last visit. Drink at least six 8 ounce glasses of water daily which will help to return kidney function back to normal. Contact Dr.Hawkins in 2 days to advise him of your visit here. He should recheck your kidney function in the office one day next week. Take Tylenol for mild pain or the pain medicine prescribed for bad pain Many things can cause belly (abdominal) pain. Most times, the belly pain is not dangerous. Many cases of belly pain can be watched and treated at home. HOME CARE   Do not take medicines that help you go poop (laxatives) unless told to by your doctor.  Only take medicine as told by your doctor.  Eat or drink as told by your doctor. Your doctor will tell you if you should be on a special diet. GET HELP IF:  You do not know what is causing your belly pain.  You have belly pain while you are sick to your stomach (nauseous) or have runny poop (diarrhea).  You have pain while you pee or poop.  Your belly pain wakes you up at night.  You have belly pain that gets worse or better when you eat.  You have belly pain that gets worse when you eat fatty foods.  You have a fever. GET HELP RIGHT AWAY IF:   The pain does not go away within 2 hours.  You keep throwing up (vomiting).  The pain changes and is only in the right or left part of the belly.  You have bloody or tarry looking poop. MAKE SURE YOU:   Understand these instructions.  Will watch your condition.  Will get help right away if you are not doing well or get worse. Document Released: 03/05/2008 Document Revised: 09/22/2013 Document Reviewed: 05/27/2013 [facility] Patient Information 2015 [place], [place]. This information is not intended to replace advice given to you by your health care provider. Make sure you discuss any questions you have with your health care provider.

## 2014-10-02 NOTE — ED Notes (Signed)
Pt ambulated without difficulty to bathroom.

## 2014-10-11 DIAGNOSIS — H2513 Age-related nuclear cataract, bilateral: Secondary | ICD-10-CM | POA: Diagnosis not present

## 2014-10-11 DIAGNOSIS — H4011X3 Primary open-angle glaucoma, severe stage: Secondary | ICD-10-CM | POA: Diagnosis not present

## 2014-10-12 ENCOUNTER — Ambulatory Visit (INDEPENDENT_AMBULATORY_CARE_PROVIDER_SITE_OTHER): Payer: Commercial Managed Care - HMO | Admitting: Gynecology

## 2014-10-12 ENCOUNTER — Ambulatory Visit: Payer: Commercial Managed Care - HMO | Admitting: Gynecology

## 2014-10-12 ENCOUNTER — Encounter: Payer: Self-pay | Admitting: Gynecology

## 2014-10-12 VITALS — BP 138/80

## 2014-10-12 DIAGNOSIS — Z09 Encounter for follow-up examination after completed treatment for conditions other than malignant neoplasm: Secondary | ICD-10-CM

## 2014-10-12 NOTE — Progress Notes (Signed)
   Patient presented to the office today for final postop visit. Review of her record indicated that on 08/13/2014 she had a wide local excision of the right medial thigh lesion and was here to have her sutures removed. Her pathology report had demonstrated the following:  Diagnosis Skin , right medial thigh SEBORRHEIC KERATOSIS, IRRITATED, LATERAL MARGIN INVOLVED, AND FOCAL ACANTHOLYTIC DYSKERATOSIS, LIMITED MARGINS FREE  Patient was started on cephalexin 500 mg 4 times a day for 10 days which she was seen in the office on November 23 for follow-up visit.  Her sutures were removed on September 02, 2014 and patient developed a tape rash. She was started on Vibramycin or milligrams twice a day and instructed to debris dear was open water daily and to apply Neosporin to return back in 6 weeks for final postop visit which is today.  Exam: Right medial thigh previous wide local excision site completely healed  Assessment/plan: Patient status post wide local excision of medial thigh (right) pigmented lesion which turned out to be seborrheic keratosis and acantholytic dyskeratosis with margins free. Patient otherwise scheduled to return back in October this year for annual exam or when necessary.

## 2014-10-18 DIAGNOSIS — R110 Nausea: Secondary | ICD-10-CM | POA: Diagnosis not present

## 2014-10-18 DIAGNOSIS — I10 Essential (primary) hypertension: Secondary | ICD-10-CM | POA: Diagnosis not present

## 2014-10-18 DIAGNOSIS — J329 Chronic sinusitis, unspecified: Secondary | ICD-10-CM | POA: Diagnosis not present

## 2014-10-18 DIAGNOSIS — E119 Type 2 diabetes mellitus without complications: Secondary | ICD-10-CM | POA: Diagnosis not present

## 2014-12-06 DIAGNOSIS — E230 Hypopituitarism: Secondary | ICD-10-CM | POA: Diagnosis not present

## 2014-12-06 DIAGNOSIS — E119 Type 2 diabetes mellitus without complications: Secondary | ICD-10-CM | POA: Diagnosis not present

## 2014-12-06 DIAGNOSIS — I10 Essential (primary) hypertension: Secondary | ICD-10-CM | POA: Diagnosis not present

## 2014-12-06 DIAGNOSIS — R109 Unspecified abdominal pain: Secondary | ICD-10-CM | POA: Diagnosis not present

## 2015-01-18 DIAGNOSIS — C50912 Malignant neoplasm of unspecified site of left female breast: Secondary | ICD-10-CM | POA: Diagnosis not present

## 2015-01-28 DIAGNOSIS — Z23 Encounter for immunization: Secondary | ICD-10-CM | POA: Diagnosis not present

## 2015-01-28 DIAGNOSIS — E119 Type 2 diabetes mellitus without complications: Secondary | ICD-10-CM | POA: Diagnosis not present

## 2015-01-28 DIAGNOSIS — Z794 Long term (current) use of insulin: Secondary | ICD-10-CM | POA: Diagnosis not present

## 2015-01-28 DIAGNOSIS — M810 Age-related osteoporosis without current pathological fracture: Secondary | ICD-10-CM | POA: Diagnosis not present

## 2015-01-28 DIAGNOSIS — E893 Postprocedural hypopituitarism: Secondary | ICD-10-CM | POA: Diagnosis not present

## 2015-01-28 DIAGNOSIS — Z1389 Encounter for screening for other disorder: Secondary | ICD-10-CM | POA: Diagnosis not present

## 2015-01-31 ENCOUNTER — Encounter (HOSPITAL_COMMUNITY): Payer: Self-pay | Admitting: Emergency Medicine

## 2015-01-31 ENCOUNTER — Emergency Department (HOSPITAL_COMMUNITY)
Admission: EM | Admit: 2015-01-31 | Discharge: 2015-01-31 | Disposition: A | Discharge status: Home / Self Care | Payer: Commercial Managed Care - HMO | Attending: Emergency Medicine | Admitting: Emergency Medicine

## 2015-01-31 DIAGNOSIS — E669 Obesity, unspecified: Secondary | ICD-10-CM | POA: Insufficient documentation

## 2015-01-31 DIAGNOSIS — Z7982 Long term (current) use of aspirin: Secondary | ICD-10-CM | POA: Insufficient documentation

## 2015-01-31 DIAGNOSIS — Z86018 Personal history of other benign neoplasm: Secondary | ICD-10-CM | POA: Diagnosis not present

## 2015-01-31 DIAGNOSIS — I10 Essential (primary) hypertension: Secondary | ICD-10-CM | POA: Insufficient documentation

## 2015-01-31 DIAGNOSIS — E119 Type 2 diabetes mellitus without complications: Secondary | ICD-10-CM | POA: Diagnosis not present

## 2015-01-31 DIAGNOSIS — D649 Anemia, unspecified: Secondary | ICD-10-CM | POA: Insufficient documentation

## 2015-01-31 DIAGNOSIS — E079 Disorder of thyroid, unspecified: Secondary | ICD-10-CM | POA: Insufficient documentation

## 2015-01-31 DIAGNOSIS — Z7952 Long term (current) use of systemic steroids: Secondary | ICD-10-CM | POA: Diagnosis not present

## 2015-01-31 DIAGNOSIS — Z792 Long term (current) use of antibiotics: Secondary | ICD-10-CM | POA: Insufficient documentation

## 2015-01-31 DIAGNOSIS — Z794 Long term (current) use of insulin: Secondary | ICD-10-CM | POA: Diagnosis not present

## 2015-01-31 DIAGNOSIS — Z79899 Other long term (current) drug therapy: Secondary | ICD-10-CM | POA: Diagnosis not present

## 2015-01-31 DIAGNOSIS — Z853 Personal history of malignant neoplasm of breast: Secondary | ICD-10-CM | POA: Insufficient documentation

## 2015-01-31 DIAGNOSIS — E785 Hyperlipidemia, unspecified: Secondary | ICD-10-CM | POA: Diagnosis not present

## 2015-01-31 DIAGNOSIS — M1990 Unspecified osteoarthritis, unspecified site: Secondary | ICD-10-CM | POA: Diagnosis not present

## 2015-01-31 DIAGNOSIS — M810 Age-related osteoporosis without current pathological fracture: Secondary | ICD-10-CM | POA: Insufficient documentation

## 2015-01-31 DIAGNOSIS — K219 Gastro-esophageal reflux disease without esophagitis: Secondary | ICD-10-CM | POA: Insufficient documentation

## 2015-01-31 DIAGNOSIS — L259 Unspecified contact dermatitis, unspecified cause: Secondary | ICD-10-CM | POA: Diagnosis not present

## 2015-01-31 DIAGNOSIS — R21 Rash and other nonspecific skin eruption: Secondary | ICD-10-CM | POA: Diagnosis present

## 2015-01-31 DIAGNOSIS — H409 Unspecified glaucoma: Secondary | ICD-10-CM | POA: Insufficient documentation

## 2015-01-31 MED ORDER — TRIAMCINOLONE ACETONIDE 0.5 % EX OINT: 1.0000 "application " | TOPICAL_OINTMENT | Freq: Two times a day (BID) | CUTANEOUS | Status: AC

## 2015-01-31 NOTE — ED Notes (Addendum)
Pt reports rash that started on Friday that has progressed to medium size,red raised areas on upper bilateral chest,right arm.airway patent. Pt denies any new self care products,meds.

## 2015-01-31 NOTE — Discharge Instructions (Signed)
Rash A rash is a change in the color or texture of your skin. There are many different types of rashes. You may have other problems that accompany your rash. CAUSES   Infections.  Allergic reactions. This can include allergies to pets or foods.  Certain medicines.  Exposure to certain chemicals, soaps, or cosmetics.  Heat.  Exposure to poisonous plants.  Tumors, both cancerous and noncancerous. SYMPTOMS   Redness.  Scaly skin.  Itchy skin.  Dry or cracked skin.  Bumps.  Blisters.  Pain. DIAGNOSIS  Your caregiver may do a physical exam to determine what type of rash you have. A skin sample (biopsy) may be taken and examined under a microscope. TREATMENT  Treatment depends on the type of rash you have. Your caregiver may prescribe certain medicines. For serious conditions, you may need to see a skin doctor (dermatologist). HOME CARE INSTRUCTIONS   Avoid the substance that caused your rash.  Do not scratch your rash. This can cause infection.  You may take cool baths to help stop itching.  Only take over-the-counter or prescription medicines as directed by your caregiver.  Keep all follow-up appointments as directed by your caregiver. SEEK IMMEDIATE MEDICAL CARE IF:  You have increasing pain, swelling, or redness.  You have a fever.  You have new or severe symptoms.  You have body aches, diarrhea, or vomiting.  Your rash is not better after 3 days. MAKE SURE YOU:  Understand these instructions.  Will watch your condition.  Will get help right away if you are not doing well or get worse. Document Released: 09/07/2002 Document Revised: 12/10/2011 Document Reviewed: 07/02/2011 [facility] Patient Information 2015 [place], [place]. This information is not intended to replace advice given to you by your health care provider. Make sure you discuss any questions you have with your health care provider.    As discussed, it may be possible your skin is reacting  to your new unwashed bras.  Wash these before wearing again.  Call your doctor for a recheck if this medicine is not improving your rash within 1 week.

## 2015-01-31 NOTE — ED Provider Notes (Signed)
CSN: [id number]     Arrival date & time 01/31/15  1738 History  This chart was scribed for non-physician practitioner, Evalee Jefferson, PA-C, working with Milton Ferguson, MD, by Jeanell Sparrow, ED Scribe. This patient was seen in room APFT23/APFT23 and the patient's care was started at 5:51 PM.   Chief Complaint  Patient presents with  . Rash   The history is provided by the patient. No language interpreter was used.   HPI Comments: Alexis Singh is a 69 y.o. female who presents to the Emergency Department complaining of constant moderate rash that started 3 days ago. She reports that she has had a gradually worsening rash on her chest, right shoulder, and RUE since 3 days ago. She describes the rash as red and itchy. She reports using .05 betamethasone cream with minimal relief. She denies any sick contacts with similar rash. Also has had no new exposures to creams, lotions, soaps or other skin care products.  However, she has purchased new bras recently and has been wearing them without having washed them first.    Past Medical History  Diagnosis Date  . Glaucoma   . Obesity   . Hyperlipidemia   . Thyroid disease     overactive thyroid  . Diabetes mellitus   . Cancer 1994    left breast  . Osteoporosis   . Pituitary tumor   . Hypertension   . Fibroid   . PONV (postoperative nausea and vomiting)   . Shortness of breath   . Blood in urine   . GERD (gastroesophageal reflux disease)   . Headache(784.0)   . Arthritis   . Anemia   . Hyperthyroidism    Past Surgical History  Procedure Laterality Date  . Breast lumpectomy      with removal of lymph nodes/had chemo and radiation  . Pituitary tumor removal  [place]  . Tubal ligation    . Abdominal hysterectomy      TAH BSO  . Toe surgery    . Cholecystectomy    . Eye surgery      Laser  . Oophorectomy      BSO  . Wrist fracture surgery    . Vein procedure    . Left wrist surgery    . Orif ankle fracture Right 11/12/2013     Procedure: OPEN REDUCTION INTERNAL FIXATION (ORIF) RIGHT ANKLE FRACTURE;  Surgeon: Wylene Simmer, MD;  Location: [facility];  Service: Orthopedics;  Laterality: Right;   Family History  Problem Relation Age of Onset  . Breast cancer Mother     Age 74  . Hypertension Mother   . Heart failure Father   . Hypertension Sister   . Diabetes Brother   . Hypertension Brother   . Cancer Brother     Unsure what type   History  Substance Use Topics  . Smoking status: Never Smoker   . Smokeless tobacco: Never Used  . Alcohol Use: No   OB History    Gravida Para Term Preterm AB TAB SAB Ectopic Multiple Living   2 1 1  1     1      Review of Systems  Constitutional: Negative for fever.  HENT: Negative for congestion and sore throat.   Eyes: Negative.   Respiratory: Negative for chest tightness and shortness of breath.   Cardiovascular: Negative for chest pain.  Gastrointestinal: Negative for nausea and abdominal pain.  Genitourinary: Negative.   Musculoskeletal: Negative for joint swelling, arthralgias and neck  pain.  Skin: Positive for rash. Negative for wound.  Neurological: Negative for dizziness, weakness, light-headedness, numbness and headaches.  Psychiatric/Behavioral: Negative.     Allergies  Ambien; Ciprofloxacin hcl; and Codeine  Home Medications   Prior to Admission medications   Medication Sig Start Date End Date Taking? Authorizing Provider  amLODipine (NORVASC) 5 MG tablet Take 5 mg by mouth daily. 12/08/13   Historical Provider, MD  aspirin 325 MG tablet Take 325 mg by mouth daily.    Historical Provider, MD  atorvastatin (LIPITOR) 40 MG tablet Take 40 mg by mouth at bedtime.     Historical Provider, MD  brimonidine (ALPHAGAN P) 0.1 % SOLN Place 1 drop into both eyes 2 (two) times daily.     Historical Provider, MD  cephALEXin (KEFLEX) 500 MG capsule Take 1 capsule (500 mg total) by mouth 4 (four) times daily. Patient not taking: Reported on 10/02/2014 08/13/14   Terrance Mass, MD  cholecalciferol (VITAMIN D) 1000 UNITS tablet Take 1,000 Units by mouth daily.    Historical Provider, MD  dorzolamide-timolol (COSOPT) 22.3-6.8 MG/ML ophthalmic solution Place 1 drop into both eyes 2 (two) times daily.      Historical Provider, MD  doxycycline (VIBRAMYCIN) 100 MG capsule Take 1 capsule (100 mg total) by mouth 2 (two) times daily. Patient not taking: Reported on 10/02/2014 09/03/14   Terrance Mass, MD  ferrous sulfate 325 (65 FE) MG tablet Take 325 mg by mouth daily.    Historical Provider, MD  HYDROcodone-acetaminophen (NORCO) 5-325 MG per tablet Take 1 tablet by mouth every 6 (six) hours as needed for moderate pain or severe pain. 10/02/14   [name], MD  HYDROcodone-acetaminophen (NORCO/VICODIN) 5-325 MG per tablet Take 1 tablet by mouth every 6 (six) hours as needed (for pain). Patient not taking: Reported on 10/02/2014 08/13/14   Terrance Mass, MD  hydrocortisone (CORTEF) 10 MG tablet Take 10 mg by mouth daily.     Historical Provider, MD  insulin detemir (LEVEMIR) 100 UNIT/ML injection Inject 18 Units into the skin at bedtime.    Historical Provider, MD  latanoprost (XALATAN) 0.005 % ophthalmic solution Place 1 drop into both eyes at bedtime.    Historical Provider, MD  levothyroxine (SYNTHROID, LEVOTHROID) 88 MCG tablet Take 88 mcg by mouth daily before breakfast.    Historical Provider, MD  losartan (COZAAR) 100 MG tablet Take 100 mg by mouth daily.      Historical Provider, MD  omeprazole (PRILOSEC) 20 MG capsule Take 20 mg by mouth daily. 09/30/14   Historical Provider, MD  ondansetron (ZOFRAN) 8 MG tablet Take 1 tablet (8 mg total) by mouth every 8 (eight) hours as needed for nausea or vomiting. 10/02/14   [name], MD  triamcinolone ointment (KENALOG) 0.5 % Apply 1 application topically 2 (two) times daily. 01/31/15 02/07/15  Evalee Jefferson, PA-C   BP 122/65 mmHg  Pulse 72  Temp(Src) 98 F (36.7 C) (Oral)  Resp 18  Ht 5\' 4"  (1.626 m)  Wt 172 lb  (78.019 kg)  BMI 29.51 kg/m2  SpO2 99% Physical Exam  Constitutional: She appears well-developed and well-nourished. No distress.  HENT:  Head: Normocephalic.  Neck: Neck supple.  Cardiovascular: Normal rate.   Pulmonary/Chest: Effort normal. She has no wheezes.  Musculoskeletal: Normal range of motion. She exhibits no edema.  Skin: Rash noted.  Irregularly shared patches on upper chest wall that are erythematous and slightly raised. Largest measuring 3 cm. One smaller patch on right  upper arm. No drainage, pustules, blisters, or red streaking.   Nursing note and vitals reviewed.  ED Course  Procedures (including critical care time) DIAGNOSTIC STUDIES: Oxygen Saturation is 99% on RA, normal by my interpretation.    COORDINATION OF CARE: 5:55 PM- Pt advised of plan for treatment and pt agrees.  Labs Review Labs Reviewed - No data to display  Imaging Review No results found.   EKG Interpretation None      MDM   Final diagnoses:  Contact dermatitis    Suspect possible reaction to new bras, possibly chemical on the fabric which may resolve once washed which was recommended.  In the intermim, prescribed kenalog cream topical, benadryl or gold bond anti itch cream prn for itching.  F/u with pcp if sx persist.  I personally performed the services described in this documentation, which was scribed in my presence. The recorded information has been reviewed and is accurate.    Evalee Jefferson, PA-C 02/02/15 1427  Milton Ferguson, MD 02/02/15 [phone]

## 2015-02-11 DIAGNOSIS — I10 Essential (primary) hypertension: Secondary | ICD-10-CM | POA: Diagnosis not present

## 2015-02-11 DIAGNOSIS — E119 Type 2 diabetes mellitus without complications: Secondary | ICD-10-CM | POA: Diagnosis not present

## 2015-02-11 DIAGNOSIS — T7840XA Allergy, unspecified, initial encounter: Secondary | ICD-10-CM | POA: Diagnosis not present

## 2015-03-23 DIAGNOSIS — R21 Rash and other nonspecific skin eruption: Secondary | ICD-10-CM | POA: Diagnosis not present

## 2015-04-20 ENCOUNTER — Other Ambulatory Visit: Payer: Self-pay

## 2015-04-20 DIAGNOSIS — Z1231 Encounter for screening mammogram for malignant neoplasm of breast: Secondary | ICD-10-CM

## 2015-04-27 ENCOUNTER — Telehealth: Payer: Self-pay | Admitting: *Deleted

## 2015-04-27 DIAGNOSIS — H2513 Age-related nuclear cataract, bilateral: Secondary | ICD-10-CM | POA: Diagnosis not present

## 2015-04-27 DIAGNOSIS — H4011X3 Primary open-angle glaucoma, severe stage: Secondary | ICD-10-CM | POA: Diagnosis not present

## 2015-04-27 NOTE — Telephone Encounter (Signed)
Pt walked in office asked if Dr.Fernandez recommends 3 D mammogram. I explained to pt with family history of breast cancer yes proceed with 3-D mammogram this year. Pt will schedule.

## 2015-05-05 DIAGNOSIS — Z79899 Other long term (current) drug therapy: Secondary | ICD-10-CM | POA: Diagnosis not present

## 2015-05-05 DIAGNOSIS — Z5181 Encounter for therapeutic drug level monitoring: Secondary | ICD-10-CM | POA: Diagnosis not present

## 2015-05-05 DIAGNOSIS — L509 Urticaria, unspecified: Secondary | ICD-10-CM | POA: Diagnosis not present

## 2015-05-25 DIAGNOSIS — H4011X0 Primary open-angle glaucoma, stage unspecified: Secondary | ICD-10-CM | POA: Diagnosis not present

## 2015-05-25 DIAGNOSIS — H02056 Trichiasis without entropian left eye, unspecified eyelid: Secondary | ICD-10-CM | POA: Diagnosis not present

## 2015-05-25 DIAGNOSIS — H409 Unspecified glaucoma: Secondary | ICD-10-CM | POA: Diagnosis not present

## 2015-05-25 DIAGNOSIS — E119 Type 2 diabetes mellitus without complications: Secondary | ICD-10-CM | POA: Diagnosis not present

## 2015-05-25 DIAGNOSIS — H524 Presbyopia: Secondary | ICD-10-CM | POA: Diagnosis not present

## 2015-05-25 DIAGNOSIS — H2513 Age-related nuclear cataract, bilateral: Secondary | ICD-10-CM | POA: Diagnosis not present

## 2015-05-30 ENCOUNTER — Ambulatory Visit
Admission: RE | Admit: 2015-05-30 | Discharge: 2015-05-30 | Disposition: A | Discharge status: Home / Self Care | Payer: Commercial Managed Care - HMO | Source: Ambulatory Visit

## 2015-05-30 DIAGNOSIS — Z1231 Encounter for screening mammogram for malignant neoplasm of breast: Secondary | ICD-10-CM

## 2015-05-31 DIAGNOSIS — R21 Rash and other nonspecific skin eruption: Secondary | ICD-10-CM | POA: Diagnosis not present

## 2015-05-31 DIAGNOSIS — D649 Anemia, unspecified: Secondary | ICD-10-CM | POA: Diagnosis not present

## 2015-05-31 DIAGNOSIS — A770 Spotted fever due to Rickettsia rickettsii: Secondary | ICD-10-CM | POA: Diagnosis not present

## 2015-06-01 ENCOUNTER — Other Ambulatory Visit: Payer: Self-pay | Admitting: Internal Medicine

## 2015-06-01 DIAGNOSIS — R928 Other abnormal and inconclusive findings on diagnostic imaging of breast: Secondary | ICD-10-CM

## 2015-06-08 ENCOUNTER — Ambulatory Visit
Admission: RE | Admit: 2015-06-08 | Discharge: 2015-06-08 | Disposition: A | Discharge status: Home / Self Care | Payer: Commercial Managed Care - HMO | Source: Ambulatory Visit | Attending: Internal Medicine | Admitting: Internal Medicine

## 2015-06-08 ENCOUNTER — Other Ambulatory Visit: Payer: Self-pay | Admitting: Dermatology

## 2015-06-08 ENCOUNTER — Other Ambulatory Visit: Payer: Commercial Managed Care - HMO

## 2015-06-08 DIAGNOSIS — D485 Neoplasm of uncertain behavior of skin: Secondary | ICD-10-CM | POA: Diagnosis not present

## 2015-06-08 DIAGNOSIS — L509 Urticaria, unspecified: Secondary | ICD-10-CM | POA: Diagnosis not present

## 2015-06-08 DIAGNOSIS — R928 Other abnormal and inconclusive findings on diagnostic imaging of breast: Secondary | ICD-10-CM | POA: Diagnosis not present

## 2015-06-08 DIAGNOSIS — L500 Allergic urticaria: Secondary | ICD-10-CM | POA: Diagnosis not present

## 2015-06-11 ENCOUNTER — Encounter (HOSPITAL_COMMUNITY): Payer: Self-pay | Admitting: Emergency Medicine

## 2015-06-11 ENCOUNTER — Emergency Department (HOSPITAL_COMMUNITY)
Admission: EM | Admit: 2015-06-11 | Discharge: 2015-06-11 | Disposition: A | Discharge status: Home / Self Care | Payer: Commercial Managed Care - HMO | Attending: Emergency Medicine | Admitting: Emergency Medicine

## 2015-06-11 DIAGNOSIS — E0590 Thyrotoxicosis, unspecified without thyrotoxic crisis or storm: Secondary | ICD-10-CM | POA: Diagnosis not present

## 2015-06-11 DIAGNOSIS — E119 Type 2 diabetes mellitus without complications: Secondary | ICD-10-CM | POA: Insufficient documentation

## 2015-06-11 DIAGNOSIS — M545 Low back pain, unspecified: Secondary | ICD-10-CM

## 2015-06-11 DIAGNOSIS — M1990 Unspecified osteoarthritis, unspecified site: Secondary | ICD-10-CM | POA: Insufficient documentation

## 2015-06-11 DIAGNOSIS — Z86018 Personal history of other benign neoplasm: Secondary | ICD-10-CM | POA: Diagnosis not present

## 2015-06-11 DIAGNOSIS — C50911 Malignant neoplasm of unspecified site of right female breast: Secondary | ICD-10-CM | POA: Insufficient documentation

## 2015-06-11 DIAGNOSIS — E669 Obesity, unspecified: Secondary | ICD-10-CM | POA: Insufficient documentation

## 2015-06-11 DIAGNOSIS — Z79899 Other long term (current) drug therapy: Secondary | ICD-10-CM | POA: Diagnosis not present

## 2015-06-11 DIAGNOSIS — Z794 Long term (current) use of insulin: Secondary | ICD-10-CM | POA: Diagnosis not present

## 2015-06-11 DIAGNOSIS — D649 Anemia, unspecified: Secondary | ICD-10-CM | POA: Insufficient documentation

## 2015-06-11 DIAGNOSIS — K219 Gastro-esophageal reflux disease without esophagitis: Secondary | ICD-10-CM | POA: Insufficient documentation

## 2015-06-11 DIAGNOSIS — Z7982 Long term (current) use of aspirin: Secondary | ICD-10-CM | POA: Insufficient documentation

## 2015-06-11 MED ORDER — CYCLOBENZAPRINE HCL 10 MG PO TABS: 10.0000 mg | ORAL_TABLET | Freq: Two times a day (BID) | ORAL | Status: DC | PRN

## 2015-06-11 MED ORDER — OXYCODONE-ACETAMINOPHEN 5-325 MG PO TABS
1.0000 | ORAL_TABLET | Freq: Once | ORAL | Status: AC
  Administered 2015-06-11: 1 via ORAL
  Filled 2015-06-11: qty 1

## 2015-06-11 MED ORDER — OXYCODONE-ACETAMINOPHEN 5-325 MG PO TABS: 2.0000 | ORAL_TABLET | ORAL | Status: DC | PRN

## 2015-06-11 MED ORDER — NAPROXEN 500 MG PO TABS: 500.0000 mg | ORAL_TABLET | Freq: Two times a day (BID) | ORAL | Status: DC

## 2015-06-11 MED ORDER — CYCLOBENZAPRINE HCL 10 MG PO TABS
5.0000 mg | ORAL_TABLET | Freq: Once | ORAL | Status: AC
  Administered 2015-06-11: 5 mg via ORAL
  Filled 2015-06-11: qty 1

## 2015-06-11 MED ORDER — LIDOCAINE 5 % EX PTCH
1.0000 | MEDICATED_PATCH | CUTANEOUS | Status: DC
  Filled 2015-06-11: qty 1

## 2015-06-11 NOTE — ED Provider Notes (Signed)
CSN: [id number]     Arrival date & time 06/11/15  1359 History   First MD Initiated Contact with Patient 06/11/15 1413     Chief Complaint  Patient presents with  . Back Pain     (Consider location/radiation/quality/duration/timing/severity/associated sxs/prior Treatment) Patient is a 69 y.o. female presenting with back pain. The history is provided by the patient. No language interpreter was used.  Back Pain Associated symptoms: no fever and no headaches   Alexis Singh is a 69 year old female with multiple medical problems who presents for low back pain after bending over in the shower 5 days ago. She thought it was getting better at one point because she was able to turn in bed with out pain for 2 days but it became worse today. She states that it has not gotten any better with Tylenol. She has also been using an ice pack with her little relief. Her pain is worse with movement. She denies any fever, chills, lower extremity weakness or numbness, saddle anesthesia or IV drug use. She has a history of breast cancer. She took prednisone in June for a rash but denies any recent steroid use.  Past Medical History  Diagnosis Date  . Glaucoma   . Obesity   . Hyperlipidemia   . Thyroid disease     overactive thyroid  . Diabetes mellitus   . Cancer 1994    left breast  . Osteoporosis   . Pituitary tumor   . Hypertension   . Fibroid   . PONV (postoperative nausea and vomiting)   . Shortness of breath   . Blood in urine   . GERD (gastroesophageal reflux disease)   . Headache(784.0)   . Arthritis   . Anemia   . Hyperthyroidism    Past Surgical History  Procedure Laterality Date  . Breast lumpectomy      with removal of lymph nodes/had chemo and radiation  . Pituitary tumor removal  [place]  . Tubal ligation    . Abdominal hysterectomy      TAH BSO  . Toe surgery    . Cholecystectomy    . Eye surgery      Laser  . Oophorectomy      BSO  . Wrist fracture surgery    . Vein  procedure    . Left wrist surgery    . Orif ankle fracture Right 11/12/2013    Procedure: OPEN REDUCTION INTERNAL FIXATION (ORIF) RIGHT ANKLE FRACTURE;  Surgeon: Wylene Simmer, MD;  Location: [facility];  Service: Orthopedics;  Laterality: Right;   Family History  Problem Relation Age of Onset  . Breast cancer Mother     Age 97  . Hypertension Mother   . Heart failure Father   . Hypertension Sister   . Diabetes Brother   . Hypertension Brother   . Cancer Brother     Unsure what type   Social History  Substance Use Topics  . Smoking status: Never Smoker   . Smokeless tobacco: Never Used  . Alcohol Use: No   OB History    Gravida Para Term Preterm AB TAB SAB Ectopic Multiple Living   2 1 1  1     1      Review of Systems  Constitutional: Negative for fever.  Gastrointestinal: Negative for vomiting.  Genitourinary: Negative for frequency, decreased urine volume and difficulty urinating.  Musculoskeletal: Positive for back pain.  Neurological: Negative for headaches.      Allergies  Ambien; Ciprofloxacin  hcl; and Codeine  Home Medications   Prior to Admission medications   Medication Sig Start Date End Date Taking? Authorizing Provider  amLODipine (NORVASC) 5 MG tablet Take 5 mg by mouth daily. 12/08/13   Historical Provider, MD  aspirin 325 MG tablet Take 325 mg by mouth daily.    Historical Provider, MD  atorvastatin (LIPITOR) 40 MG tablet Take 40 mg by mouth at bedtime.     Historical Provider, MD  brimonidine (ALPHAGAN P) 0.1 % SOLN Place 1 drop into both eyes 2 (two) times daily.     Historical Provider, MD  cephALEXin (KEFLEX) 500 MG capsule Take 1 capsule (500 mg total) by mouth 4 (four) times daily. Patient not taking: Reported on 10/02/2014 08/13/14   Terrance Mass, MD  cholecalciferol (VITAMIN D) 1000 UNITS tablet Take 1,000 Units by mouth daily.    Historical Provider, MD  cyclobenzaprine (FLEXERIL) 10 MG tablet Take 1 tablet (10 mg total) by mouth 2 (two) times  daily as needed for muscle spasms. 06/11/15   Sreeja Spies Patel-Mills, PA-C  dorzolamide-timolol (COSOPT) 22.3-6.8 MG/ML ophthalmic solution Place 1 drop into both eyes 2 (two) times daily.      Historical Provider, MD  doxycycline (VIBRAMYCIN) 100 MG capsule Take 1 capsule (100 mg total) by mouth 2 (two) times daily. Patient not taking: Reported on 10/02/2014 09/03/14   Terrance Mass, MD  ferrous sulfate 325 (65 FE) MG tablet Take 325 mg by mouth daily.    Historical Provider, MD  HYDROcodone-acetaminophen (NORCO) 5-325 MG per tablet Take 1 tablet by mouth every 6 (six) hours as needed for moderate pain or severe pain. 10/02/14   [name], MD  HYDROcodone-acetaminophen (NORCO/VICODIN) 5-325 MG per tablet Take 1 tablet by mouth every 6 (six) hours as needed (for pain). Patient not taking: Reported on 10/02/2014 08/13/14   Terrance Mass, MD  hydrocortisone (CORTEF) 10 MG tablet Take 10 mg by mouth daily.     Historical Provider, MD  insulin detemir (LEVEMIR) 100 UNIT/ML injection Inject 18 Units into the skin at bedtime.    Historical Provider, MD  latanoprost (XALATAN) 0.005 % ophthalmic solution Place 1 drop into both eyes at bedtime.    Historical Provider, MD  levothyroxine (SYNTHROID, LEVOTHROID) 88 MCG tablet Take 88 mcg by mouth daily before breakfast.    Historical Provider, MD  losartan (COZAAR) 100 MG tablet Take 100 mg by mouth daily.      Historical Provider, MD  naproxen (NAPROSYN) 500 MG tablet Take 1 tablet (500 mg total) by mouth 2 (two) times daily. 06/11/15   Dorean Daniello Patel-Mills, PA-C  omeprazole (PRILOSEC) 20 MG capsule Take 20 mg by mouth daily. 09/30/14   Historical Provider, MD  ondansetron (ZOFRAN) 8 MG tablet Take 1 tablet (8 mg total) by mouth every 8 (eight) hours as needed for nausea or vomiting. 10/02/14   [name], MD  oxyCODONE-acetaminophen (PERCOCET/ROXICET) 5-325 MG per tablet Take 2 tablets by mouth every 4 (four) hours as needed for severe pain. 06/11/15   Willam Munford  Patel-Mills, PA-C   BP 146/77 mmHg  Pulse 68  Temp(Src) 98.3 F (36.8 C)  Resp 18  Ht 5\' 4"  (1.626 m)  Wt 175 lb (79.379 kg)  BMI 30.02 kg/m2  SpO2 100% Physical Exam  Constitutional: She is oriented to person, place, and time. She appears well-developed and well-nourished.  HENT:  Head: Normocephalic.  Eyes: Conjunctivae are normal.  Neck: Neck supple.  Cardiovascular: Normal rate.   Pulmonary/Chest: Effort normal. No  respiratory distress.  Abdominal: Soft. She exhibits no distension. There is no tenderness.  Musculoskeletal: Normal range of motion.  Right lumbar paravertebral tenderness to palpation and reproducible with movement. No midline lumbar tenderness. Able to straight leg raise bilaterally. No rash noted. No saddle anesthesia. Able to dorsi and plantar flex without difficulty. Ambulatory.  Neurological: She is alert and oriented to person, place, and time.  Skin: Skin is warm and dry.  Psychiatric: She has a normal mood and affect. Her behavior is normal.  Nursing note and vitals reviewed.   ED Course  Procedures (including critical care time) Labs Review Labs Reviewed - No data to display  Imaging Review No results found.   EKG Interpretation None      MDM   Final diagnoses:  Right-sided low back pain without sciatica   Patient presents for low back pain after bending over 5 days ago. She has no red flag symptoms for cauda equina syndrome or epidural abscess. She is well appearing and afebrile. I do not believe imaging is necessary at this time. This is most likely musculoskeletal related. Medications  oxyCODONE-acetaminophen (PERCOCET/ROXICET) 5-325 MG per tablet 1 tablet (1 tablet Oral Given 06/11/15 1429)  cyclobenzaprine (FLEXERIL) tablet 5 mg (5 mg Oral Given 06/11/15 1429)  I discussed return precautions and follow-up with the patient.  Rx: Robaxin, percocet, naproxen    Ottie Glazier, PA-C 06/11/15 1519  Virgel Manifold, MD 06/13/15  [phone]

## 2015-06-11 NOTE — Discharge Instructions (Signed)
Back Exercises Follow-up with her primary care physician. Return for any fever, bowel or bladder incontinence or retention, numbness or tingling in your lower extremities.  Back exercises help treat and prevent back injuries. The goal is to increase your strength in your belly (abdominal) and back muscles. These exercises can also help with flexibility. Start these exercises when told by your doctor. HOME CARE Back exercises include: Pelvic Tilt.  Lie on your back with your knees bent. Tilt your pelvis until the lower part of your back is against the floor. Hold this position 5 to 10 sec. Repeat this exercise 5 to 10 times. Knee to Chest.  Pull 1 knee up against your chest and hold for 20 to 30 seconds. Repeat this with the other knee. This may be done with the other leg straight or bent, whichever feels better. Then, pull both knees up against your chest. Sit-Ups or Curl-Ups.  Bend your knees 90 degrees. Start with tilting your pelvis, and do a partial, slow sit-up. Only lift your upper half 30 to 45 degrees off the floor. Take at least 2 to 3 seonds for each sit-up. Do not do sit-ups with your knees out straight. If partial sit-ups are difficult, simply do the above but with only tightening your belly (abdominal) muscles and holding it as told. Hip-Lift.  Lie on your back with your knees flexed 90 degrees. Push down with your feet and shoulders as you raise your hips 2 inches off the floor. Hold for 10 seconds, repeat 5 to 10 times. Back Arches.  Lie on your stomach. Prop yourself up on bent elbows. Slowly press on your hands, causing an arch in your low back. Repeat 3 to 5 times. Shoulder-Lifts.  Lie face down with arms beside your body. Keep hips and belly pressed to floor as you slowly lift your head and shoulders off the floor. Do not overdo your exercises. Be careful in the beginning. Exercises may cause you some mild back discomfort. If the pain lasts for more than 15 minutes, stop the  exercises until you see your doctor. Improvement with exercise for back problems is slow.  Document Released: 10/20/2010 Document Revised: 12/10/2011 Document Reviewed: 07/19/2011 [facility] Patient Information 2015 [place], [place]. This information is not intended to replace advice given to you by your health care provider. Make sure you discuss any questions you have with your health care provider.

## 2015-06-11 NOTE — ED Notes (Signed)
Pt states she bent over in the shower on Tuesday and hurt her back, has not gotten any better, taking tylenol  And using ice pack without relief

## 2015-06-11 NOTE — ED Notes (Signed)
Mills-PA at bedside at this time for evaluation.

## 2015-06-13 ENCOUNTER — Encounter (HOSPITAL_COMMUNITY): Payer: Self-pay | Admitting: Family Medicine

## 2015-06-13 ENCOUNTER — Emergency Department (HOSPITAL_COMMUNITY)
Admission: EM | Admit: 2015-06-13 | Discharge: 2015-06-13 | Disposition: A | Discharge status: Home / Self Care | Payer: Commercial Managed Care - HMO | Attending: Physician Assistant | Admitting: Physician Assistant

## 2015-06-13 ENCOUNTER — Emergency Department (HOSPITAL_COMMUNITY): Payer: Commercial Managed Care - HMO

## 2015-06-13 DIAGNOSIS — Z791 Long term (current) use of non-steroidal anti-inflammatories (NSAID): Secondary | ICD-10-CM | POA: Diagnosis not present

## 2015-06-13 DIAGNOSIS — E0590 Thyrotoxicosis, unspecified without thyrotoxic crisis or storm: Secondary | ICD-10-CM | POA: Insufficient documentation

## 2015-06-13 DIAGNOSIS — I10 Essential (primary) hypertension: Secondary | ICD-10-CM | POA: Diagnosis not present

## 2015-06-13 DIAGNOSIS — Z7982 Long term (current) use of aspirin: Secondary | ICD-10-CM | POA: Diagnosis not present

## 2015-06-13 DIAGNOSIS — E785 Hyperlipidemia, unspecified: Secondary | ICD-10-CM | POA: Diagnosis not present

## 2015-06-13 DIAGNOSIS — Z86018 Personal history of other benign neoplasm: Secondary | ICD-10-CM | POA: Insufficient documentation

## 2015-06-13 DIAGNOSIS — H409 Unspecified glaucoma: Secondary | ICD-10-CM | POA: Insufficient documentation

## 2015-06-13 DIAGNOSIS — M1990 Unspecified osteoarthritis, unspecified site: Secondary | ICD-10-CM | POA: Diagnosis not present

## 2015-06-13 DIAGNOSIS — E669 Obesity, unspecified: Secondary | ICD-10-CM | POA: Diagnosis not present

## 2015-06-13 DIAGNOSIS — Z794 Long term (current) use of insulin: Secondary | ICD-10-CM | POA: Diagnosis not present

## 2015-06-13 DIAGNOSIS — K219 Gastro-esophageal reflux disease without esophagitis: Secondary | ICD-10-CM | POA: Diagnosis not present

## 2015-06-13 DIAGNOSIS — Z7952 Long term (current) use of systemic steroids: Secondary | ICD-10-CM | POA: Insufficient documentation

## 2015-06-13 DIAGNOSIS — Z79899 Other long term (current) drug therapy: Secondary | ICD-10-CM | POA: Diagnosis not present

## 2015-06-13 DIAGNOSIS — D649 Anemia, unspecified: Secondary | ICD-10-CM | POA: Insufficient documentation

## 2015-06-13 DIAGNOSIS — E119 Type 2 diabetes mellitus without complications: Secondary | ICD-10-CM | POA: Insufficient documentation

## 2015-06-13 DIAGNOSIS — Z853 Personal history of malignant neoplasm of breast: Secondary | ICD-10-CM | POA: Diagnosis not present

## 2015-06-13 DIAGNOSIS — M25551 Pain in right hip: Secondary | ICD-10-CM | POA: Insufficient documentation

## 2015-06-13 NOTE — Discharge Instructions (Signed)
Your xrays are negative. Use your home pain medications as needed for pain. Use ice and heat 20 minutes at a time every hour as needed for additional relief. Follow up with the orthopedist in 1-2 weeks for recheck of symptoms. Return to the ER for changes or worsening symptoms.    Hip Pain Your hip is the joint between your upper legs and your lower pelvis. The bones, cartilage, tendons, and muscles of your hip joint perform a lot of work each day supporting your body weight and allowing you to move around. Hip pain can range from a minor ache to severe pain in one or both of your hips. Pain may be felt on the inside of the hip joint near the groin, or the outside near the buttocks and upper thigh. You may have swelling or stiffness as well.  HOME CARE INSTRUCTIONS   Take medicines only as directed by your health care provider.  Apply ice to the injured area:  Put ice in a plastic bag.  Place a towel between your skin and the bag.  Leave the ice on for 15-20 minutes at a time, 3-4 times a day.  Keep your leg raised (elevated) when possible to lessen swelling.  Avoid activities that cause pain.  Follow specific exercises as directed by your health care provider.  Sleep with a pillow between your legs on your most comfortable side.  Record how often you have hip pain, the location of the pain, and what it feels like. SEEK MEDICAL CARE IF:   You are unable to put weight on your leg.  Your hip is red or swollen or very tender to touch.  Your pain or swelling continues or worsens after 1 week.  You have increasing difficulty walking.  You have a fever. SEEK IMMEDIATE MEDICAL CARE IF:   You have fallen.  You have a sudden increase in pain and swelling in your hip. MAKE SURE YOU:   Understand these instructions.  Will watch your condition.  Will get help right away if you are not doing well or get worse. Document Released: 03/07/2010 Document Revised: 02/01/2014 Document  Reviewed: 05/14/2013 [facility] Patient Information 2015 [place], [place]. This information is not intended to replace advice given to you by your health care provider. Make sure you discuss any questions you have with your health care provider.  Heat Therapy Heat therapy can help ease sore, stiff, injured, and tight muscles and joints. Heat relaxes your muscles, which may help ease your pain.  RISKS AND COMPLICATIONS If you have any of the following conditions, do not use heat therapy unless your health care provider has approved:  Poor circulation.  Healing wounds or scarred skin in the area being treated.  Diabetes, heart disease, or high blood pressure.  Not being able to feel (numbness) the area being treated.  Unusual swelling of the area being treated.  Active infections.  Blood clots.  Cancer.  Inability to communicate pain. This may include young children and people who have problems with their brain function (dementia).  Pregnancy. Heat therapy should only be used on old, pre-existing, or long-lasting (chronic) injuries. Do not use heat therapy on new injuries unless directed by your health care provider. HOW TO USE HEAT THERAPY There are several different kinds of heat therapy, including:  Moist heat pack.  Warm water bath.  Hot water bottle.  Electric heating pad.  Heated gel pack.  Heated wrap.  Electric heating pad. Use the heat therapy method suggested by  your health care provider. Follow your health care provider's instructions on when and how to use heat therapy. GENERAL HEAT THERAPY RECOMMENDATIONS  Do not sleep while using heat therapy. Only use heat therapy while you are awake.  Your skin may turn pink while using heat therapy. Do not use heat therapy if your skin turns red.  Do not use heat therapy if you have new pain.  High heat or long exposure to heat can cause burns. Be careful when using heat therapy to avoid burning your skin.  Do not  use heat therapy on areas of your skin that are already irritated, such as with a rash or sunburn. SEEK MEDICAL CARE IF:  You have blisters, redness, swelling, or numbness.  You have new pain.  Your pain is worse. MAKE SURE YOU:  Understand these instructions.  Will watch your condition.  Will get help right away if you are not doing well or get worse. Document Released: 12/10/2011 Document Revised: 02/01/2014 Document Reviewed: 11/10/2013 [facility] Patient Information 2015 [place], [place]. This information is not intended to replace advice given to you by your health care provider. Make sure you discuss any questions you have with your health care provider.  Cryotherapy Cryotherapy is when you put ice on your injury. Ice helps lessen pain and puffiness (swelling) after an injury. Ice works the best when you start using it in the first 24 to 48 hours after an injury. HOME CARE  Put a dry or damp towel between the ice pack and your skin.  You may press gently on the ice pack.  Leave the ice on for no more than 10 to 20 minutes at a time.  Check your skin after 5 minutes to make sure your skin is okay.  Rest at least 20 minutes between ice pack uses.  Stop using ice when your skin loses feeling (numbness).  Do not use ice on someone who cannot tell you when it hurts. This includes small children and people with memory problems (dementia). GET HELP RIGHT AWAY IF:  You have white spots on your skin.  Your skin turns blue or pale.  Your skin feels waxy or hard.  Your puffiness gets worse. MAKE SURE YOU:   Understand these instructions.  Will watch your condition.  Will get help right away if you are not doing well or get worse. Document Released: 03/05/2008 Document Revised: 12/10/2011 Document Reviewed: 05/10/2011 [facility] Patient Information 2015 [place], [place]. This information is not intended to replace advice given to you by your health care provider. Make sure you  discuss any questions you have with your health care provider.

## 2015-06-13 NOTE — ED Notes (Signed)
Pt here for right hip pain. sts  since bending over the wrong way getting out of the shower last week.

## 2015-06-13 NOTE — ED Provider Notes (Signed)
CSN: [id number]     Arrival date & time 06/13/15  1342 History  This chart was scribed for non-physician practitioner Zacarias Pontes, PA-C working with Macarthur Critchley, MD by Meriel Pica, ED Scribe. This patient was seen in room TR03C/TR03C and the patient's care was started at 2:46 PM.   Chief Complaint  Patient presents with  . Hip Pain   Patient is a 69 y.o. female presenting with hip pain. The history is provided by the patient. No language interpreter was used.  Hip Pain This is a new problem. The current episode started more than 2 days ago. The problem occurs constantly. The problem has not changed since onset.Pertinent negatives include no chest pain, no abdominal pain and no shortness of breath. The symptoms are aggravated by standing. The symptoms are relieved by narcotics and NSAIDs. Treatments tried: flexeril, naproxen, tylenol, percocet  The treatment provided mild relief.   HPI Comments: Alexis Singh is a 68 y.o. female, with a significant PMHx of HLD, DM2, HTN, obesity, osteoporosis, and arthritis as well as a list of medical conditions noted below, who presents to the Emergency Department complaining of gradual onset, constant, 8/10, aching, non-radiating, posterior right hip pain s/p bending over in the shower 6 days ago, that is worsened with standing up. Pt was seen 2 days ago for same complaint and prescribed flexeril, naprosyn, and Percocet with some relief, has taken tylenol with mild relief. Pt denies fevers, chills, nausea, vomiting, diarrhea, abdominal pain, CP, SOB, myalgias, hematuria, dysuria, numbness, tingling or weakness, head injury, fall, LOC, saddle paresthesia, or bladder or bowel incontinence. When she was seen 2 days ago they didn't perform any xrays.   Past Medical History  Diagnosis Date  . Glaucoma   . Obesity   . Hyperlipidemia   . Thyroid disease     overactive thyroid  . Diabetes mellitus   . Cancer 1994    left breast  .  Osteoporosis   . Pituitary tumor   . Hypertension   . Fibroid   . PONV (postoperative nausea and vomiting)   . Shortness of breath   . Blood in urine   . GERD (gastroesophageal reflux disease)   . Headache(784.0)   . Arthritis   . Anemia   . Hyperthyroidism    Past Surgical History  Procedure Laterality Date  . Breast lumpectomy      with removal of lymph nodes/had chemo and radiation  . Pituitary tumor removal  [place]  . Tubal ligation    . Abdominal hysterectomy      TAH BSO  . Toe surgery    . Cholecystectomy    . Eye surgery      Laser  . Oophorectomy      BSO  . Wrist fracture surgery    . Vein procedure    . Left wrist surgery    . Orif ankle fracture Right 11/12/2013    Procedure: OPEN REDUCTION INTERNAL FIXATION (ORIF) RIGHT ANKLE FRACTURE;  Surgeon: Wylene Simmer, MD;  Location: [place];  Service: Orthopedics;  Laterality: Right;   Family History  Problem Relation Age of Onset  . Breast cancer Mother     Age 32  . Hypertension Mother   . Heart failure Father   . Hypertension Sister   . Diabetes Brother   . Hypertension Brother   . Cancer Brother     Unsure what type   Social History  Substance Use Topics  . Smoking status: Never Smoker   .  Smokeless tobacco: Never Used  . Alcohol Use: No   OB History    Gravida Para Term Preterm AB TAB SAB Ectopic Multiple Living   2 1 1  1     1      Review of Systems  Constitutional: Negative for fever and chills.  Respiratory: Negative for shortness of breath.   Cardiovascular: Negative for chest pain.  Gastrointestinal: Negative for nausea, vomiting, abdominal pain and diarrhea.  Genitourinary: Negative for dysuria and hematuria.  Musculoskeletal: Positive for arthralgias ( right hip). Negative for gait problem.  Skin: Negative for color change.  Allergic/Immunologic: Positive for immunocompromised state (diabetic).  Neurological: Negative for syncope, weakness and numbness.  Psychiatric/Behavioral:  Negative for confusion.  A complete 10 system review of systems was obtained and is otherwise negative except at noted in the HPI and PMH.  Allergies  Ambien; Ciprofloxacin hcl; and Codeine  Home Medications   Prior to Admission medications   Medication Sig Start Date End Date Taking? Authorizing Provider  amLODipine (NORVASC) 5 MG tablet Take 5 mg by mouth daily. 12/08/13   Historical Provider, MD  aspirin 325 MG tablet Take 325 mg by mouth daily.    Historical Provider, MD  atorvastatin (LIPITOR) 40 MG tablet Take 40 mg by mouth at bedtime.     Historical Provider, MD  brimonidine (ALPHAGAN P) 0.1 % SOLN Place 1 drop into both eyes 2 (two) times daily.     Historical Provider, MD  cephALEXin (KEFLEX) 500 MG capsule Take 1 capsule (500 mg total) by mouth 4 (four) times daily. Patient not taking: Reported on 10/02/2014 08/13/14   Terrance Mass, MD  cholecalciferol (VITAMIN D) 1000 UNITS tablet Take 1,000 Units by mouth daily.    Historical Provider, MD  cyclobenzaprine (FLEXERIL) 10 MG tablet Take 1 tablet (10 mg total) by mouth 2 (two) times daily as needed for muscle spasms. 06/11/15   Hanna Patel-Mills, PA-C  dorzolamide-timolol (COSOPT) 22.3-6.8 MG/ML ophthalmic solution Place 1 drop into both eyes 2 (two) times daily.      Historical Provider, MD  doxycycline (VIBRAMYCIN) 100 MG capsule Take 1 capsule (100 mg total) by mouth 2 (two) times daily. Patient not taking: Reported on 10/02/2014 09/03/14   Terrance Mass, MD  ferrous sulfate 325 (65 FE) MG tablet Take 325 mg by mouth daily.    Historical Provider, MD  HYDROcodone-acetaminophen (NORCO) 5-325 MG per tablet Take 1 tablet by mouth every 6 (six) hours as needed for moderate pain or severe pain. 10/02/14   [name], MD  HYDROcodone-acetaminophen (NORCO/VICODIN) 5-325 MG per tablet Take 1 tablet by mouth every 6 (six) hours as needed (for pain). Patient not taking: Reported on 10/02/2014 08/13/14   Terrance Mass, MD  hydrocortisone  (CORTEF) 10 MG tablet Take 10 mg by mouth daily.     Historical Provider, MD  insulin detemir (LEVEMIR) 100 UNIT/ML injection Inject 18 Units into the skin at bedtime.    Historical Provider, MD  latanoprost (XALATAN) 0.005 % ophthalmic solution Place 1 drop into both eyes at bedtime.    Historical Provider, MD  levothyroxine (SYNTHROID, LEVOTHROID) 88 MCG tablet Take 88 mcg by mouth daily before breakfast.    Historical Provider, MD  losartan (COZAAR) 100 MG tablet Take 100 mg by mouth daily.      Historical Provider, MD  naproxen (NAPROSYN) 500 MG tablet Take 1 tablet (500 mg total) by mouth 2 (two) times daily. 06/11/15   Hanna Patel-Mills, PA-C  omeprazole (PRILOSEC) 20  MG capsule Take 20 mg by mouth daily. 09/30/14   Historical Provider, MD  ondansetron (ZOFRAN) 8 MG tablet Take 1 tablet (8 mg total) by mouth every 8 (eight) hours as needed for nausea or vomiting. 10/02/14   [name], MD  oxyCODONE-acetaminophen (PERCOCET/ROXICET) 5-325 MG per tablet Take 2 tablets by mouth every 4 (four) hours as needed for severe pain. 06/11/15   Hanna Patel-Mills, PA-C   BP 138/85 mmHg  Pulse 65  Temp(Src) 97.9 F (36.6 C) (Oral)  Resp 16  Ht 5\' 4"  (1.626 m)  Wt 178 lb (80.74 kg)  BMI 30.54 kg/m2  SpO2 100% Physical Exam  Constitutional: She is oriented to person, place, and time. Vital signs are normal. She appears well-developed and well-nourished.  Non-toxic appearance. No distress.  Afebrile, nontoxic, NAD  HENT:  Head: Normocephalic and atraumatic.  Mouth/Throat: Mucous membranes are normal.  Eyes: Conjunctivae and EOM are normal. Right eye exhibits no discharge. Left eye exhibits no discharge.  Neck: Normal range of motion. Neck supple.  Cardiovascular: Normal rate and intact distal pulses.   Pulmonary/Chest: Effort normal. No respiratory distress.  Abdominal: Normal appearance. She exhibits no distension.  Musculoskeletal: Normal range of motion.       Right hip: She exhibits  tenderness. She exhibits normal range of motion, normal strength, no swelling, no crepitus and no deformity.       Legs: All spinal levels with no focal bony TTP, no step-offs or deformities, with no paraspinal muscle TTP.  Right hip with mild joint line TTP, FROM intact, negative log roll test, no swelling or erythema, no warmth, no crepitus or deformity, no limb length discrepancy. Strength and sensation grossly intact, distal pulses intact, negative SLR bilaterally.   Neurological: She is alert and oriented to person, place, and time. She has normal strength. No sensory deficit.  Skin: Skin is warm, dry and intact. No rash noted.  Psychiatric: She has a normal mood and affect. Her behavior is normal.  Nursing note and vitals reviewed.   ED Course  Procedures  DIAGNOSTIC STUDIES: Oxygen Saturation is 100% on RA, normal by my interpretation.    COORDINATION OF CARE: 2:59 PM Discussed treatment plan which includes to order Xray of right hip with pt. Pt acknowledges and agrees to plan.   Labs Review Labs Reviewed - No data to display  Imaging Review Dg Hip Unilat With Pelvis 2-3 Views Right  06/13/2015   CLINICAL DATA:  Right hip pain for 1 week.  No known injury.  EXAM: DG HIP (WITH OR WITHOUT PELVIS) 2-3V RIGHT  COMPARISON:  12/26/2013 CT  FINDINGS: There is no evidence of hip fracture or dislocation. There is no evidence of arthropathy or other focal bone abnormality.  IMPRESSION: Negative.   Electronically Signed   By: Margarette Canada M.D.   On: 06/13/2015 15:42   I have personally reviewed and evaluated these images and lab results as part of my medical decision-making.   EKG Interpretation None      MDM   Final diagnoses:  Right hip pain    69 y.o. female here with ongoing R hip pain since bending after shower last week. NVI with soft compartments. No limb-length discrepancy. +Jointline tenderness. Will obtain xray. Pt took pain meds PTA, will hold on any other meds now. Will  reassess shortly.   3:47 PM Xray neg. Discussed use of home pain meds as needed. Discussed ice/heat. Will have her f/up with ortho in 1-2wks for ongoing pain relief. I  explained the diagnosis and have given explicit precautions to return to the ER including for any other new or worsening symptoms. The patient understands and accepts the medical plan as it's been dictated and I have answered their questions. Discharge instructions concerning home care and prescriptions have been given. The patient is STABLE and is discharged to home in good condition.  I personally performed the services described in this documentation, which was scribed in my presence. The recorded information has been reviewed and is accurate.  BP 138/85 mmHg  Pulse 65  Temp(Src) 97.9 F (36.6 C) (Oral)  Resp 16  Ht 5\' 4"  (1.626 m)  Wt 178 lb (80.74 kg)  BMI 30.54 kg/m2  SpO2 100%  No orders of the defined types were placed in this encounter.     Vedder Brittian Camprubi-Soms, PA-C 06/13/15 1548  Courteney Julio Alm, MD 06/13/15 1553

## 2015-06-23 DIAGNOSIS — M25551 Pain in right hip: Secondary | ICD-10-CM | POA: Diagnosis not present

## 2015-07-07 ENCOUNTER — Ambulatory Visit (INDEPENDENT_AMBULATORY_CARE_PROVIDER_SITE_OTHER): Payer: Commercial Managed Care - HMO | Admitting: Gynecology

## 2015-07-07 ENCOUNTER — Encounter: Payer: Self-pay | Admitting: Gynecology

## 2015-07-07 VITALS — BP 128/78 | Ht 64.0 in | Wt 188.0 lb

## 2015-07-07 DIAGNOSIS — Z23 Encounter for immunization: Secondary | ICD-10-CM

## 2015-07-07 DIAGNOSIS — Z8739 Personal history of other diseases of the musculoskeletal system and connective tissue: Secondary | ICD-10-CM

## 2015-07-07 DIAGNOSIS — Z01419 Encounter for gynecological examination (general) (routine) without abnormal findings: Secondary | ICD-10-CM

## 2015-07-07 NOTE — Progress Notes (Signed)
Alexis Singh 05-09-1946 [id number]   History:    69 y.o.  for annual breast and pelvic exam..Patient with past history of osteoporosis had been several years ago on Boniva and Evista. Patient also has had several fragility fractures such as in her left wrist and right ankle. 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 She was treated with lumpectomy, chemotherapy and radiation. She had a normal mammogram this year.She is having no vaginal bleeding. She had a total abdominal hysterectomy, bilateral salpingo-oophorectomy for fibroids in 1990. She is having no pelvic pain. She has never had an abnormal Pap smear. Her last Pap smear was 2011. She does have atrophic vaginitis but does not have dyspareunia and therefore does not need treatment. She is having no urinary incontinence or dysuria.  Patient with history of diabetes mellitus, hypertension, hyperlipidemia, hyperthyroidism and glaucoma. 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 Patient reports her last colonoscopy was normal in 2011. Her PCP is Dr. Sinda Du. Entered endocrinologist is Dr. [name] who has been monitoring her diabetes and her hyperthyroidism as well as glaucoma and hypertension.  On November 2015 patient had a wide local excision of the right medial thigh lesion and was here to have her sutures removed. Her pathology report had demonstrated the following:  Diagnosis Skin , right medial thigh SEBORRHEIC KERATOSIS, IRRITATED, LATERAL MARGIN INVOLVED, AND FOCAL ACANTHOLYTIC DYSKERATOSIS, LIMITED MARGINS FREE  Past medical history,surgical history, family history and social history were all reviewed and documented in the EPIC chart.  Gynecologic History No LMP recorded. Patient has had a hysterectomy. Contraception: status post hysterectomy Last Pap: 2011. Results  were: normal Last mammogram: 2016. Results were: normal  Obstetric History OB History  Gravida Para Term Preterm AB SAB TAB Ectopic Multiple Living  2 1 1  1     1     # Outcome Date GA Lbr Len/2nd Weight Sex Delivery Anes PTL Lv  2 AB           1 Term                ROS: A ROS was performed and pertinent positives and negatives are included in the history.  GENERAL: No fevers or chills. HEENT: No change in vision, no earache, sore throat or sinus congestion. NECK: No pain or stiffness. CARDIOVASCULAR: No chest pain or pressure. No palpitations. PULMONARY: No shortness of breath, cough or wheeze. GASTROINTESTINAL: No abdominal pain, nausea, vomiting or diarrhea, melena or bright red blood per rectum. GENITOURINARY: No urinary frequency, urgency, hesitancy or dysuria. MUSCULOSKELETAL: No joint or muscle pain, no back pain, no recent trauma. DERMATOLOGIC: No rash, no itching, no lesions. ENDOCRINE: No polyuria, polydipsia, no heat or cold intolerance. No recent change in weight. HEMATOLOGICAL: No anemia or easy bruising or bleeding. NEUROLOGIC: No headache, seizures, numbness, tingling or weakness. PSYCHIATRIC: No depression, no loss of interest in normal activity or change in sleep pattern.     Exam: chaperone present  BP 128/78 mmHg  Ht 5\' 4"  (1.626 m)  Wt 188 lb (85.276 kg)  BMI 32.25 kg/m2  Body mass index is 32.25 kg/(m^2).  General appearance : Well developed well nourished female. No acute distress HEENT: Eyes: no retinal hemorrhage or exudates,  Neck supple, trachea midline, no carotid bruits, no thyroidmegaly Lungs: Clear to auscultation, no rhonchi or wheezes, or rib  retractions  Heart: Regular rate and rhythm, no murmurs or gallops Breast:Examined in sitting and supine position were symmetrical in appearance, no palpable masses or tenderness,  no skin retraction, no nipple inversion, no nipple discharge, no skin discoloration, no axillary or supraclavicular  lymphadenopathy Abdomen: no palpable masses or tenderness, no rebound or guarding Extremities: no edema or skin discoloration or tenderness  Pelvic:  Bartholin, Urethra, Skene Glands: Within normal limits             Vagina: No gross lesions or discharge  Cervix: Absent  Uterus  absent  Adnexa  Without masses or tenderness  Anus and perineum  normal   Rectovaginal  normal sphincter tone without palpated masses or tenderness             Hemoccult PCP provides     Assessment/Plan:  69 y.o. female for annual exam with past history of osteoporosis and fragility fracture is on a drug holiday and being monitored by her endocrinologist. He has been doing blood work on her as well as her PCP. Patient was given a requisition to schedule her shingles vaccine in the next few weeks. She was to receive the flu vaccine today here in the office. Pap smear not indicated according to the new guidelines. We discussed importance of monthly self breast examinations. We discussed importance of calcium and vitamin D and weightbearing exercises   Terrance Mass MD, 11:34 AM 07/07/2015

## 2015-07-07 NOTE — Patient Instructions (Signed)
Shingles Vaccine: What You Need to Know WHAT IS SHINGLES?  Shingles is a painful skin rash, often with blisters. It is also called Herpes Zoster or just Zoster.  A shingles rash usually appears on one side of the face or body and lasts from 2 to 4 weeks. Its main symptom is pain, which can be quite severe. Other symptoms of shingles can include fever, headache, chills, and upset stomach. Very rarely, a shingles infection can lead to pneumonia, hearing problems, blindness, brain inflammation (encephalitis), or death.  For about 1 person in 5, severe pain can continue even after the rash clears up. This is called post-herpetic neuralgia.  Shingles is caused by the Varicella Zoster virus. This is the same virus that causes chickenpox. Only someone who has had a case of chickenpox or rarely, has gotten chickenpox vaccine, can get shingles. The virus stays in your body. It can reappear many years later to cause a case of shingles.  You cannot catch shingles from another person with shingles. However, a person who has never had chickenpox (or chickenpox vaccine) could get chickenpox from someone with shingles. This is not very common.  Shingles is far more common in people 15 and older than in younger people. It is also more common in people whose immune systems are weakened because of a disease such as cancer or drugs such as steroids or chemotherapy.  At least 1 million people get shingles per year in the [place]. SHINGLES VACCINE  A vaccine for shingles was licensed in 8657. In clinical trials, the vaccine reduced the risk of shingles by 50%. It can also reduce the pain in people who still get shingles after being vaccinated.  A single dose of shingles vaccine is recommended for adults 69 years of age and older. SOME PEOPLE SHOULD NOT GET SHINGLES VACCINE OR SHOULD WAIT A person should not get shingles vaccine if he or she:  Has ever had a life-threatening allergic reaction to gelatin, the  antibiotic neomycin, or any other component of shingles vaccine. Tell your caregiver if you have any severe allergies.  Has a weakened immune system because of current:  AIDS or another disease that affects the immune system.  Treatment with drugs that affect the immune system, such as prolonged use of high-dose steroids.  Cancer treatment, such as radiation or chemotherapy.  Cancer affecting the bone marrow or lymphatic system, such as leukemia or lymphoma.  Is pregnant, or might be pregnant. Women should not become pregnant until at least 4 weeks after getting shingles vaccine. Someone with a minor illness, such as a cold, may be vaccinated. Anyone with a moderate or severe acute illness should usually wait until he or she recovers before getting the vaccine. This includes anyone with a temperature of 101.3 F (38 C) or higher. WHAT ARE THE RISKS FROM SHINGLES VACCINE?  A vaccine, like any medicine, could possibly cause serious problems, such as severe allergic reactions. However, the risk of a vaccine causing serious harm, or death, is extremely small.  No serious problems have been identified with shingles vaccine. Mild Problems  Redness, soreness, swelling, or itching at the site of the injection (about 1 person in 3).  Headache (about 1 person in 5). Like all vaccines, shingles vaccine is being closely monitored for unusual or severe problems. WHAT IF THERE IS A MODERATE OR SEVERE REACTION? What should I look for? Any unusual condition, such as a severe allergic reaction or a high fever. If a severe allergic reaction  problems.  WHAT IF THERE IS A MODERATE OR SEVERE REACTION?  What should I look for?  Any unusual condition, such as a severe allergic reaction or a high fever. If a severe allergic reaction occurred, it would be within a few minutes to an hour after the shot. Signs of a serious allergic reaction can include difficulty breathing, weakness, hoarseness or wheezing, a fast heartbeat, hives, dizziness, paleness, or swelling of the throat.  What should I do?  · Call your caregiver, or get the person to a caregiver right away.  · Tell the caregiver what  happened, the date and time it happened, and when the vaccination was given.  · Ask the caregiver to report the reaction by filing a Vaccine Adverse Event Reporting System (VAERS) form. Or, you can file this report through the VAERS web site at www.vaers.hhs.gov or by calling 1-800-822-7967.  VAERS does not provide medical advice.  HOW CAN I LEARN MORE?  · Ask your caregiver. He or she can give you the vaccine package insert or suggest other sources of information.  · Contact the Centers for Disease Control and Prevention (CDC):    Call 1-800-232-4636 (1-800-CDC-INFO).    Visit the CDC website at www.cdc.gov/vaccines  CDC Shingles Vaccine VIS (07/06/08)     This information is not intended to replace advice given to you by your health care provider. Make sure you discuss any questions you have with your health care provider.     Document Released: 07/15/2006 Document Revised: 02/01/2015 Document Reviewed: 01/07/2013  Elsevier Interactive Patient Education ©2016 Elsevier Inc.

## 2015-07-14 DIAGNOSIS — M545 Low back pain: Secondary | ICD-10-CM | POA: Diagnosis not present

## 2015-08-04 DIAGNOSIS — E119 Type 2 diabetes mellitus without complications: Secondary | ICD-10-CM | POA: Diagnosis not present

## 2015-08-04 DIAGNOSIS — M810 Age-related osteoporosis without current pathological fracture: Secondary | ICD-10-CM | POA: Diagnosis not present

## 2015-08-04 DIAGNOSIS — Z794 Long term (current) use of insulin: Secondary | ICD-10-CM | POA: Diagnosis not present

## 2015-08-04 DIAGNOSIS — E893 Postprocedural hypopituitarism: Secondary | ICD-10-CM | POA: Diagnosis not present

## 2015-09-02 DIAGNOSIS — H401133 Primary open-angle glaucoma, bilateral, severe stage: Secondary | ICD-10-CM | POA: Diagnosis not present

## 2015-09-28 DIAGNOSIS — H401133 Primary open-angle glaucoma, bilateral, severe stage: Secondary | ICD-10-CM | POA: Diagnosis not present

## 2015-11-04 DIAGNOSIS — R748 Abnormal levels of other serum enzymes: Secondary | ICD-10-CM | POA: Diagnosis not present

## 2015-11-04 DIAGNOSIS — E119 Type 2 diabetes mellitus without complications: Secondary | ICD-10-CM | POA: Diagnosis not present

## 2015-11-04 DIAGNOSIS — Z794 Long term (current) use of insulin: Secondary | ICD-10-CM | POA: Diagnosis not present

## 2015-11-04 DIAGNOSIS — E893 Postprocedural hypopituitarism: Secondary | ICD-10-CM | POA: Diagnosis not present

## 2015-11-04 DIAGNOSIS — M810 Age-related osteoporosis without current pathological fracture: Secondary | ICD-10-CM | POA: Diagnosis not present

## 2016-02-08 DIAGNOSIS — E119 Type 2 diabetes mellitus without complications: Secondary | ICD-10-CM | POA: Diagnosis not present

## 2016-02-08 DIAGNOSIS — Z794 Long term (current) use of insulin: Secondary | ICD-10-CM | POA: Diagnosis not present

## 2016-02-08 DIAGNOSIS — E893 Postprocedural hypopituitarism: Secondary | ICD-10-CM | POA: Diagnosis not present

## 2016-02-08 DIAGNOSIS — M810 Age-related osteoporosis without current pathological fracture: Secondary | ICD-10-CM | POA: Diagnosis not present

## 2016-03-12 DIAGNOSIS — H401133 Primary open-angle glaucoma, bilateral, severe stage: Secondary | ICD-10-CM | POA: Diagnosis not present

## 2016-05-03 ENCOUNTER — Other Ambulatory Visit: Payer: Self-pay | Admitting: Gynecology

## 2016-05-03 DIAGNOSIS — Z1231 Encounter for screening mammogram for malignant neoplasm of breast: Secondary | ICD-10-CM

## 2016-05-28 DIAGNOSIS — H401133 Primary open-angle glaucoma, bilateral, severe stage: Secondary | ICD-10-CM | POA: Diagnosis not present

## 2016-05-28 DIAGNOSIS — H2513 Age-related nuclear cataract, bilateral: Secondary | ICD-10-CM | POA: Diagnosis not present

## 2016-05-28 DIAGNOSIS — E119 Type 2 diabetes mellitus without complications: Secondary | ICD-10-CM | POA: Diagnosis not present

## 2016-05-31 ENCOUNTER — Ambulatory Visit
Admission: RE | Admit: 2016-05-31 | Discharge: 2016-05-31 | Disposition: A | Discharge status: Home / Self Care | Payer: Commercial Managed Care - HMO | Source: Ambulatory Visit | Attending: Gynecology | Admitting: Gynecology

## 2016-05-31 DIAGNOSIS — Z1231 Encounter for screening mammogram for malignant neoplasm of breast: Secondary | ICD-10-CM

## 2016-06-29 ENCOUNTER — Other Ambulatory Visit: Payer: Self-pay | Admitting: *Deleted

## 2016-06-29 NOTE — Patient Outreach (Signed)
[facility]) Care Management  06/29/2016  Alexis Singh 1946-04-06 [id number]  Referral via EMMI-Prevent:  Telephone call to patient who was advised of reason for call & of [facility] services. HIPPA verification received from patient.  Patient states major health condition is Diabetes that was diagnosed in 1999. Seeing Endocrinologist every 4-6 months. Patient could not remember what last "A1c" was and did not know target. States checking blood sugar twice daily. Manages own medications & self administers insulin. Uses mail order pharmacy & local pharmacy. Taking self to MD appointments.   Recommendations for referral to [facility] for disease management . Marland Kitchen Patient consents to [facility] services.   Plan: Refer to care management assistant to assign Health Coach for disease management.  Sherrin Daisy, RN BSN CCM Care Management Coordinator [facility] Care Management  [phone]  .

## 2016-07-09 ENCOUNTER — Ambulatory Visit (INDEPENDENT_AMBULATORY_CARE_PROVIDER_SITE_OTHER): Payer: Commercial Managed Care - HMO | Admitting: Gynecology

## 2016-07-09 ENCOUNTER — Encounter: Payer: Self-pay | Admitting: Gynecology

## 2016-07-09 VITALS — BP 138/80 | Ht 64.0 in | Wt 181.0 lb

## 2016-07-09 DIAGNOSIS — M8580 Other specified disorders of bone density and structure, unspecified site: Secondary | ICD-10-CM

## 2016-07-09 DIAGNOSIS — Z23 Encounter for immunization: Secondary | ICD-10-CM | POA: Diagnosis not present

## 2016-07-09 DIAGNOSIS — Z853 Personal history of malignant neoplasm of breast: Secondary | ICD-10-CM | POA: Insufficient documentation

## 2016-07-09 DIAGNOSIS — Z01419 Encounter for gynecological examination (general) (routine) without abnormal findings: Secondary | ICD-10-CM | POA: Diagnosis not present

## 2016-07-09 NOTE — Patient Instructions (Signed)

## 2016-07-09 NOTE — Progress Notes (Signed)
Alexis Singh November 26, 1945 [id number]   History:    70 y.o.  for annual gyn exam who has no complaints today. Review of her history demonstrated that she has had in the past osteoporosis had been several years ago on Boniva and Evista. Patient also has had several fragility fractures such as in her left wrist and right ankle. 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 She was treated with lumpectomy, chemotherapy and radiation. She had a normal mammogram this year.She is having no vaginal bleeding. She had a total abdominal hysterectomy, bilateral salpingo-oophorectomy for fibroids in 1990. She is having no pelvic pain. She has never had an abnormal Pap smear. Her last Pap smear was 2011. She does have atrophic vaginitis but does not have dyspareunia and therefore does not need treatment. She is having no urinary incontinence or dysuria.  Patient with history of diabetes mellitus, hypertension, hyperlipidemia, hyperthyroidism and glaucoma. 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 Patient reports her last colonoscopy was normal in 2011. Her PCP is Dr. Sinda Du. Entered endocrinologist is Dr. [name] who has been monitoring her diabetes and her hyperthyroidism as well as glaucoma and hypertension.  On November 2015 patient had a wide local excision of the right medial thigh lesion and was here to have her sutures removed. Her pathology report had demonstrated the following:  Diagnosis Skin , right medial thigh SEBORRHEIC KERATOSIS, IRRITATED, LATERAL MARGIN INVOLVED, AND FOCAL ACANTHOLYTIC DYSKERATOSIS, LIMITED MARGINS FREE  Past medical history,surgical history, family history and social history were all reviewed and documented in the EPIC chart.  Gynecologic History No LMP recorded. Patient has had a  hysterectomy. Contraception: status post hysterectomy Last Pap: 2011. Results were: normal Last mammogram: 2017. Results were: normal  Obstetric History OB History  Gravida Para Term Preterm AB Living  2 1 1   1 1   SAB TAB Ectopic Multiple Live Births               # Outcome Date GA Lbr Len/2nd Weight Sex Delivery Anes PTL Lv  2 AB           1 Term                ROS: A ROS was performed and pertinent positives and negatives are included in the history.  GENERAL: No fevers or chills. HEENT: No change in vision, no earache, sore throat or sinus congestion. NECK: No pain or stiffness. CARDIOVASCULAR: No chest pain or pressure. No palpitations. PULMONARY: No shortness of breath, cough or wheeze. GASTROINTESTINAL: No abdominal pain, nausea, vomiting or diarrhea, melena or bright red blood per rectum. GENITOURINARY: No urinary frequency, urgency, hesitancy or dysuria. MUSCULOSKELETAL: No joint or muscle pain, no back pain, no recent trauma. DERMATOLOGIC: No rash, no itching, no lesions. ENDOCRINE: No polyuria, polydipsia, no heat or cold intolerance. No recent change in weight. HEMATOLOGICAL: No anemia or easy bruising or bleeding. NEUROLOGIC: No headache, seizures, numbness, tingling or weakness. PSYCHIATRIC: No depression, no loss of interest in normal activity or change in sleep pattern.     Exam: chaperone present  BP 138/80   Ht 5\' 4"  (1.626 m)   Wt 181 lb (82.1 kg)   BMI 31.07 kg/m   Body mass index is 31.07 kg/m.  General appearance : Well developed well nourished female. No acute distress HEENT: Eyes:  no retinal hemorrhage or exudates,  Neck supple, trachea midline, no carotid bruits, no thyroidmegaly Lungs: Clear to auscultation, no rhonchi or wheezes, or rib retractions  Heart: Regular rate and rhythm, no murmurs or gallops Breast:Examined in sitting and supine position were symmetrical in appearance, no palpable masses or tenderness,  no skin retraction, no nipple  inversion, no nipple discharge, no skin discoloration, no axillary or supraclavicular lymphadenopathy Abdomen: no palpable masses or tenderness, no rebound or guarding Extremities: no edema or skin discoloration or tenderness  Pelvic:  Bartholin, Urethra, Skene Glands: Within normal limits             Vagina: No gross lesions or discharge  Cervix: Absent  Uterus  absent  Adnexa  Without masses or tenderness  Anus and perineum  normal   Rectovaginal  normal sphincter tone without palpated masses or tenderness             Hemoccult PCP provides     Assessment/Plan:  70 y.o. female for annual exam with past history of osteoporosis and fragility fractures on a drug holiday she is being monitored by her endocrinologist. Also due to the fact in the past she's had history of hyperprolactinemia and had pituitary tumors removed in 1995 and 1999.Marland Kitchen Her PCP has been doing all her other blood work. Patient also due for bone density study this year. She was reminded on and pars of calcium vitamin D and weightbearing exercises for osteoporosis prevention. She has some vaginal atrophy and has no complaints on no hormone replacement therapy. Pap smear not indicated. Patient received the flu vaccine after she was counseled.   Terrance Mass MD, 11:02 AM 07/09/2016

## 2016-07-10 ENCOUNTER — Other Ambulatory Visit: Payer: Self-pay

## 2016-07-10 NOTE — Patient Outreach (Addendum)
[facility]) Care Management  [place]  07/10/2016   Alexis Singh [birth year] [id number]  Subjective: Telephone call to patient for initial assessment.  Patient reports doing pretty good. Patient reports she checks her sugars twice a day. Morning sugars  In the 80's-130's.  Patient not aware of A1c. Discussed with patient the importance of knowing A1c and knowing A1c goal. Patient reports she is to see Dr. [name] next month for routine follow up.  She verbalized understanding.   Objective:   Encounter Medications:  Outpatient Encounter Prescriptions as of 07/10/2016  Medication Sig Note  . amLODipine (NORVASC) 5 MG tablet Take 5 mg by mouth daily.   Marland Kitchen aspirin EC 81 MG tablet Take 81 mg by mouth daily.   . brimonidine (ALPHAGAN P) 0.1 % SOLN Place 1 drop into both eyes 2 (two) times daily.    . cholecalciferol (VITAMIN D) 1000 UNITS tablet Take 1,000 Units by mouth daily.   . dorzolamide-timolol (COSOPT) 22.3-6.8 MG/ML ophthalmic solution Place 1 drop into both eyes 2 (two) times daily.     . ferrous sulfate 325 (65 FE) MG tablet Take 325 mg by mouth daily.   . hydrocortisone (CORTEF) 10 MG tablet Take 10 mg by mouth daily.    . insulin NPH-regular Human (NOVOLIN 70/30) (70-30) 100 UNIT/ML injection Inject into the skin. 07/10/2016: Patient 14 units twice a day.   . latanoprost (XALATAN) 0.005 % ophthalmic solution Place 1 drop into both eyes at bedtime.   Marland Kitchen levothyroxine (SYNTHROID, LEVOTHROID) 88 MCG tablet Take 88 mcg by mouth daily before breakfast.   . losartan (COZAAR) 100 MG tablet Take 100 mg by mouth daily.     Marland Kitchen omeprazole (PRILOSEC) 20 MG capsule Take 20 mg by mouth daily.   . ondansetron (ZOFRAN) 8 MG tablet Take 1 tablet (8 mg total) by mouth every 8 (eight) hours as needed for nausea or vomiting.   Marland Kitchen atorvastatin (LIPITOR) 40 MG tablet Take 40 mg by mouth at bedtime.  07/10/2016: Patient forgets to take   No facility-administered encounter  medications on file as of 07/10/2016.     Functional Status:  In your present state of health, do you have any difficulty performing the following activities: 07/10/2016  Hearing? N  Vision? N  Difficulty concentrating or making decisions? N  Walking or climbing stairs? Y  Dressing or bathing? N  Doing errands, shopping? N  Preparing Food and eating ? N  Using the Toilet? N  In the past six months, have you accidently leaked urine? N  Do you have problems with loss of bowel control? N  Managing your Medications? N  Managing your Finances? N  Housekeeping or managing your Housekeeping? N  Some recent data might be hidden    Fall/Depression Screening: PHQ 2/9 Scores 07/10/2016 06/29/2016  PHQ - 2 Score 0 0    Assessment: Patient will benefit from health coach outreach for education and support for disease management of diabetes.  Plan:  [place] CM Care Plan Problem One   Flowsheet Row Most Recent Value  Care Plan Problem One  Diabetes Knowledge Deficit  Role Documenting the Problem One  [place] for Problem One  Active  THN Long Term Goal (31-90 days)  Patient will keep A1c between 6-7 within the next 90 days.  THN Long Term Goal Start Date  07/10/16  Interventions for Problem One [place] discussed with patient A1c and goal.  THN CM Short Term Goal #1 (0-30 days)  Patient will know A1c and doctor A1c goal within 30 days.  THN CM Short Term Goal #1 Start Date  07/10/16  Interventions for Short Term Goal #1  RN Health Coach discussed importance of A1c and why knowing A1c is important  THN CM Short Term Goal #2 (0-30 days)  Patient will blood sugars maintain less than 180 within the next 30 days.  THN CM Short Term Goal #2 Start Date  07/10/16  Interventions for Short Term Goal #2  [place] discussed with patient importance of following diabetic diet and controlling sugars.       RN Health Coach will provide ongoing education for  patient on diabetes through phone calls and sending printed information to patient for further discussion.  RN Health Coach will send welcome packet with consent to patient as well as printed information on diabetes.  RN Health Coach will send initial barriers letter, assessment, and care plan to primary care physician.   RN Health Coach will contact patient in the month of November and patient agrees to next contact.   [name], RN, MSN [place] [phone]

## 2016-07-23 DIAGNOSIS — Z48811 Encounter for surgical aftercare following surgery on the nervous system: Secondary | ICD-10-CM | POA: Diagnosis not present

## 2016-07-23 DIAGNOSIS — D497 Neoplasm of unspecified behavior of endocrine glands and other parts of nervous system: Secondary | ICD-10-CM | POA: Diagnosis not present

## 2016-07-23 DIAGNOSIS — D352 Benign neoplasm of pituitary gland: Secondary | ICD-10-CM | POA: Diagnosis not present

## 2016-07-27 DIAGNOSIS — H401133 Primary open-angle glaucoma, bilateral, severe stage: Secondary | ICD-10-CM | POA: Diagnosis not present

## 2016-08-08 ENCOUNTER — Other Ambulatory Visit: Payer: Self-pay

## 2016-08-08 NOTE — Patient Outreach (Signed)
[facility]) Care Management  08/08/2016  Alexis Singh April 25, 1946 [id number]   Telephone call to patient for monthly call.  No answer. HIPAA compliant voice message left.  Plan: RN Health Coach will attempt patient again in the month of November.  [name], RN, MSN [place] [phone]

## 2016-08-20 DIAGNOSIS — H401133 Primary open-angle glaucoma, bilateral, severe stage: Secondary | ICD-10-CM | POA: Diagnosis not present

## 2016-08-28 ENCOUNTER — Other Ambulatory Visit: Payer: Self-pay

## 2016-08-28 NOTE — Patient Outreach (Signed)
[facility]) Care Management  [place]  08/28/2016   Alexis Singh September 01, 1946 [id number]  Subjective: Telephone call to patient for monthly call. Patient reports she is doing ok. She reports she got packet and working on it. Reviewed with patient EMMI diabetic care and A1c.  She verbalized understanding.  Patient being treated for sinus infection. Discussed with patient signs of worsening infection. She verbalized understanding.  Patient states she had to change her appointment with Dr. [name] to September 27, 2016.  Patient blood sugar today was 127. Patient asked if her sugar was 89 should she still take her insulin. Advised patient that if she feels symptoms of hypoglycemia don't not take it right away until her sugar comes back up after treatment but if she feels fine then she should take and eat her meal.  She verbalized understanding.   Objective:   Encounter Medications:  Outpatient Encounter Prescriptions as of 08/28/2016  Medication Sig Note  . amLODipine (NORVASC) 5 MG tablet Take 5 mg by mouth daily.   Marland Kitchen aspirin EC 81 MG tablet Take 81 mg by mouth daily.   Marland Kitchen atorvastatin (LIPITOR) 40 MG tablet Take 40 mg by mouth at bedtime.  07/10/2016: Patient forgets to take  . brimonidine (ALPHAGAN P) 0.1 % SOLN Place 1 drop into both eyes 2 (two) times daily.    . cholecalciferol (VITAMIN D) 1000 UNITS tablet Take 1,000 Units by mouth daily.   . dorzolamide-timolol (COSOPT) 22.3-6.8 MG/ML ophthalmic solution Place 1 drop into both eyes 2 (two) times daily.     . ferrous sulfate 325 (65 FE) MG tablet Take 325 mg by mouth daily.   . hydrocortisone (CORTEF) 10 MG tablet Take 10 mg by mouth daily.    . insulin NPH-regular Human (NOVOLIN 70/30) (70-30) 100 UNIT/ML injection Inject into the skin. 07/10/2016: Patient 14 units twice a day.   . latanoprost (XALATAN) 0.005 % ophthalmic solution Place 1 drop into both eyes at bedtime.   Marland Kitchen levothyroxine (SYNTHROID, LEVOTHROID)  88 MCG tablet Take 88 mcg by mouth daily before breakfast.   . losartan (COZAAR) 100 MG tablet Take 100 mg by mouth daily.     Marland Kitchen omeprazole (PRILOSEC) 20 MG capsule Take 20 mg by mouth daily.   . ondansetron (ZOFRAN) 8 MG tablet Take 1 tablet (8 mg total) by mouth every 8 (eight) hours as needed for nausea or vomiting.    No facility-administered encounter medications on file as of 08/28/2016.     Functional Status:  In your present state of health, do you have any difficulty performing the following activities: 07/10/2016  Hearing? N  Vision? N  Difficulty concentrating or making decisions? N  Walking or climbing stairs? Y  Dressing or bathing? N  Doing errands, shopping? N  Preparing Food and eating ? N  Using the Toilet? N  In the past six months, have you accidently leaked urine? N  Do you have problems with loss of bowel control? N  Managing your Medications? N  Managing your Finances? N  Housekeeping or managing your Housekeeping? N  Some recent data might be hidden    Fall/Depression Screening: PHQ 2/9 Scores 08/28/2016 07/10/2016 06/29/2016  PHQ - 2 Score 0 0 0    Assessment: Patient continues to benefit from health coach outreach for disease management and support.   Plan:  [facility] CM Care Plan Problem One   Flowsheet Row Most Recent Value  Care Plan Problem One  Diabetes Knowledge Deficit  Role Documenting the Problem One  [name] for Problem One  Active  THN Long Term Goal (31-90 days)  Patient will keep A1c between 6-7 within the next 90 days.  THN Long Term Goal Start Date  08/28/16 [goal continued]  Interventions for Problem One Long Term Goal  RN Health Coach reviewed with patient A1c and goal.   THN CM Short Term Goal #1 (0-30 days)  Patient will know A1c and doctor A1c goal within 30 days.  THN CM Short Term Goal #1 Start Date  08/28/16 [goal continued]  Interventions for Short Term Goal #1  RN Health Coach reviewed importance of A1c and why  knowing A1c is important  THN CM Short Term Goal #2 (0-30 days)  Patient will blood sugars maintain less than 180 within the next 30 days.  THN CM Short Term Goal #2 Start Date  08/28/16 [goal continued]  Interventions for Short Term Goal #2  [place] reviewed with patient importance of following diabetic diet and controlling sugars.       RN Health Coach will contact patient in the month of December and patient agrees to next outreach.  [name], RN, MSN [place] [phone]

## 2016-09-19 ENCOUNTER — Other Ambulatory Visit: Payer: Self-pay

## 2016-09-19 NOTE — Patient Outreach (Signed)
[facility]) Care Management  [name]  09/19/2016   Alexis Singh 1946-07-20 [id number]  Subjective: Telephone call to patient for monthly call. Patient reports she is doing ok. She reports that her sugar this morning was 110.  She reports that her highest sugar was 174.  Discussed with patient upcoming appointment with Dr. [name] and that importance of knowing A1c in order to help her understand her diabetes management.  She verbalized understanding.    Objective:   Encounter Medications:  Outpatient Encounter Prescriptions as of 09/19/2016  Medication Sig Note  . amLODipine (NORVASC) 5 MG tablet Take 5 mg by mouth daily.   Marland Kitchen aspirin EC 81 MG tablet Take 81 mg by mouth daily.   Marland Kitchen atorvastatin (LIPITOR) 40 MG tablet Take 40 mg by mouth at bedtime.  07/10/2016: Patient forgets to take  . brimonidine (ALPHAGAN P) 0.1 % SOLN Place 1 drop into both eyes 2 (two) times daily.    . cholecalciferol (VITAMIN D) 1000 UNITS tablet Take 1,000 Units by mouth daily.   . dorzolamide-timolol (COSOPT) 22.3-6.8 MG/ML ophthalmic solution Place 1 drop into both eyes 2 (two) times daily.     . ferrous sulfate 325 (65 FE) MG tablet Take 325 mg by mouth daily.   . hydrocortisone (CORTEF) 10 MG tablet Take 10 mg by mouth daily.    . insulin NPH-regular Human (NOVOLIN 70/30) (70-30) 100 UNIT/ML injection Inject into the skin. 07/10/2016: Patient 14 units twice a day.   . latanoprost (XALATAN) 0.005 % ophthalmic solution Place 1 drop into both eyes at bedtime.   Marland Kitchen levothyroxine (SYNTHROID, LEVOTHROID) 88 MCG tablet Take 88 mcg by mouth daily before breakfast.   . losartan (COZAAR) 100 MG tablet Take 100 mg by mouth daily.     Marland Kitchen omeprazole (PRILOSEC) 20 MG capsule Take 20 mg by mouth daily.   . ondansetron (ZOFRAN) 8 MG tablet Take 1 tablet (8 mg total) by mouth every 8 (eight) hours as needed for nausea or vomiting.    No facility-administered encounter medications on file as of  09/19/2016.     Functional Status:  In your present state of health, do you have any difficulty performing the following activities: 07/10/2016  Hearing? N  Vision? N  Difficulty concentrating or making decisions? N  Walking or climbing stairs? Y  Dressing or bathing? N  Doing errands, shopping? N  Preparing Food and eating ? N  Using the Toilet? N  In the past six months, have you accidently leaked urine? N  Do you have problems with loss of bowel control? N  Managing your Medications? N  Managing your Finances? N  Housekeeping or managing your Housekeeping? N  Some recent data might be hidden    Fall/Depression Screening: PHQ 2/9 Scores 09/19/2016 08/28/2016 07/10/2016 06/29/2016  PHQ - 2 Score 0 0 0 0    Assessment: Patient continues to benefit from health coach outreach for disease management and support.    Plan:  [facility] CM Care Plan Problem One   Flowsheet Row Most Recent Value  Care Plan Problem One  Diabetes Knowledge Deficit  Role Documenting the Problem One  [place] for Problem One  Active  THN Long Term Goal (31-90 days)  Patient will keep A1c between 6-7 within the next 90 days.  THN Long Term Goal Start Date  08/28/16 [goal continued]  Interventions for Problem One Long Term Goal  RN Health Coach reiterated with patient A1c and goal.  THN CM Short Term Goal #1 (0-30 days)  Patient will know A1c and doctor A1c goal within 30 days.  THN CM Short Term Goal #1 Start Date  09/19/16 [goal continued]  Interventions for Short Term Goal #1  RN Health Coach reiterated importance of A1c and why knowing A1c is important  THN CM Short Term Goal #2 (0-30 days)  Patient will blood sugars maintain less than 180 within the next 30 days.  THN CM Short Term Goal #2 Start Date  08/28/16 [goal continued]  [facility] CM Short Term Goal #2 Met Date  09/19/16  Interventions for Short Term Goal #2  Patient maintaining sugars less than 180.       RN Health Coach will contact  patient in the month of January and patient agrees to next outreach.  [name], RN, MSN [place] [phone]

## 2016-09-27 DIAGNOSIS — M810 Age-related osteoporosis without current pathological fracture: Secondary | ICD-10-CM | POA: Diagnosis not present

## 2016-09-27 DIAGNOSIS — E893 Postprocedural hypopituitarism: Secondary | ICD-10-CM | POA: Diagnosis not present

## 2016-09-27 DIAGNOSIS — Z794 Long term (current) use of insulin: Secondary | ICD-10-CM | POA: Diagnosis not present

## 2016-09-27 DIAGNOSIS — E119 Type 2 diabetes mellitus without complications: Secondary | ICD-10-CM | POA: Diagnosis not present

## 2016-10-15 ENCOUNTER — Ambulatory Visit: Payer: Self-pay

## 2016-10-15 ENCOUNTER — Other Ambulatory Visit: Payer: Self-pay

## 2016-10-15 NOTE — Patient Outreach (Signed)
[facility]) Care Management  [place]  10/15/2016   Alexis Singh Sep 17, 1946 [id number]  Subjective: Telephone call to patient for monthly call. Patient reports she is doing good. She reports that her sugars are good. She reports seeing Dr. [name] and her A1c was 6.4.  Congratulated patient on her A1c and to continue with her diet and maintaining her sugars she verbalized understanding.   Objective:   Encounter Medications:  Outpatient Encounter Prescriptions as of 10/15/2016  Medication Sig Note  . amLODipine (NORVASC) 5 MG tablet Take 5 mg by mouth daily.   Marland Kitchen aspirin EC 81 MG tablet Take 81 mg by mouth daily.   Marland Kitchen atorvastatin (LIPITOR) 40 MG tablet Take 40 mg by mouth at bedtime.  07/10/2016: Patient forgets to take  . brimonidine (ALPHAGAN P) 0.1 % SOLN Place 1 drop into both eyes 2 (two) times daily.    . cholecalciferol (VITAMIN D) 1000 UNITS tablet Take 1,000 Units by mouth daily.   . dorzolamide-timolol (COSOPT) 22.3-6.8 MG/ML ophthalmic solution Place 1 drop into both eyes 2 (two) times daily.     . ferrous sulfate 325 (65 FE) MG tablet Take 325 mg by mouth daily.   . hydrocortisone (CORTEF) 10 MG tablet Take 10 mg by mouth daily.  10/15/2016: Patient reports taking 1 tab in am and 1/2 tab in the evening.  . insulin NPH-regular Human (NOVOLIN 70/30) (70-30) 100 UNIT/ML injection Inject into the skin. 07/10/2016: Patient 14 units twice a day.   . latanoprost (XALATAN) 0.005 % ophthalmic solution Place 1 drop into both eyes at bedtime.   Marland Kitchen levothyroxine (SYNTHROID, LEVOTHROID) 88 MCG tablet Take 88 mcg by mouth daily before breakfast.   . losartan (COZAAR) 100 MG tablet Take 100 mg by mouth daily.     Marland Kitchen omeprazole (PRILOSEC) 20 MG capsule Take 20 mg by mouth daily.   . ondansetron (ZOFRAN) 8 MG tablet Take 1 tablet (8 mg total) by mouth every 8 (eight) hours as needed for nausea or vomiting.    No facility-administered encounter medications on file as of  10/15/2016.     Functional Status:  In your present state of health, do you have any difficulty performing the following activities: 07/10/2016  Hearing? N  Vision? N  Difficulty concentrating or making decisions? N  Walking or climbing stairs? Y  Dressing or bathing? N  Doing errands, shopping? N  Preparing Food and eating ? N  Using the Toilet? N  In the past six months, have you accidently leaked urine? N  Do you have problems with loss of bowel control? N  Managing your Medications? N  Managing your Finances? N  Housekeeping or managing your Housekeeping? N  Some recent data might be hidden    Fall/Depression Screening: PHQ 2/9 Scores 10/15/2016 09/19/2016 08/28/2016 07/10/2016 06/29/2016  PHQ - 2 Score 0 0 0 0 0    Assessment: Patient continues to benefit from health coach outreach for disease management and support.    Plan:  [facility] CM Care Plan Problem One   Flowsheet Row Most Recent Value  Care Plan Problem One  Diabetes Knowledge Deficit  Role Documenting the Problem One  [place] for Problem One  Active  THN Long Term Goal (31-90 days)  Patient will keep A1c between 6-7 within the next 90 days.  THN Long Term Goal Start Date  10/15/16 [goal continued]  Interventions for Problem One Long Term Goal  RN Health Coach reviewed with  patient A1c and goal.   THN CM Short Term Goal #1 (0-30 days)  Patient will know A1c and doctor A1c goal within 30 days.  THN CM Short Term Goal #1 Start Date  10/15/16 [goal continued]  Interventions for Short Term Goal #1  RN Health Coach reviewed importance of A1c and why knowing A1c is important     [place] will contact patient in the month of February and patient agrees to next outreach.  [name], RN, MSN [place] [phone]

## 2016-11-01 DIAGNOSIS — M810 Age-related osteoporosis without current pathological fracture: Secondary | ICD-10-CM | POA: Diagnosis not present

## 2016-11-08 ENCOUNTER — Other Ambulatory Visit: Payer: Self-pay

## 2016-11-08 ENCOUNTER — Ambulatory Visit: Payer: Self-pay

## 2016-11-08 NOTE — Patient Outreach (Signed)
[facility]) Care Management  11/08/2016  Alexis Singh [birth year] [id number]   Telephone call to patient for monthly call. No answer.  HIPAA compliant voice message left.    Plan: RN Health Coach will attempt patient again in the month of February.    [name], RN, MSN [name] [phone]

## 2016-11-12 DIAGNOSIS — M810 Age-related osteoporosis without current pathological fracture: Secondary | ICD-10-CM | POA: Diagnosis not present

## 2016-11-12 DIAGNOSIS — E893 Postprocedural hypopituitarism: Secondary | ICD-10-CM | POA: Diagnosis not present

## 2016-11-12 DIAGNOSIS — Z794 Long term (current) use of insulin: Secondary | ICD-10-CM | POA: Diagnosis not present

## 2016-11-12 DIAGNOSIS — E119 Type 2 diabetes mellitus without complications: Secondary | ICD-10-CM | POA: Diagnosis not present

## 2016-11-21 ENCOUNTER — Other Ambulatory Visit: Payer: Self-pay

## 2016-11-21 NOTE — Patient Outreach (Signed)
[place] [facility]) Care Management  [place]  11/21/2016   LORIA LACINA August 29, 1946 [id number]  Subjective: Telephone call to patient for monthly call.  Patient reports she is doing good.  She reports seeing primary doctor last week.  Patient reports that her bone density test was the same as last year.  She reports not changes in medications.  Patient to return to primary doctor in May 2018.  Patient reports that her sugars have been ranging form the 80's to 160's.  Patient reports her sugar this morning was 124.  Discussed with patient continuing low carbohydrate, no sweets diet in order to control sugars.  She verbalized understanding.    Objective:   Encounter Medications:  Outpatient Encounter Prescriptions as of 11/21/2016  Medication Sig Note  . amLODipine (NORVASC) 5 MG tablet Take 5 mg by mouth daily.   Marland Kitchen aspirin EC 81 MG tablet Take 81 mg by mouth daily.   Marland Kitchen atorvastatin (LIPITOR) 40 MG tablet Take 40 mg by mouth at bedtime.  07/10/2016: Patient forgets to take  . brimonidine (ALPHAGAN P) 0.1 % SOLN Place 1 drop into both eyes 2 (two) times daily.    . cholecalciferol (VITAMIN D) 1000 UNITS tablet Take 1,000 Units by mouth daily.   . dorzolamide-timolol (COSOPT) 22.3-6.8 MG/ML ophthalmic solution Place 1 drop into both eyes 2 (two) times daily.     . ferrous sulfate 325 (65 FE) MG tablet Take 325 mg by mouth daily.   . hydrocortisone (CORTEF) 10 MG tablet Take 10 mg by mouth daily.  10/15/2016: Patient reports taking 1 tab in am and 1/2 tab in the evening.  . insulin NPH-regular Human (NOVOLIN 70/30) (70-30) 100 UNIT/ML injection Inject into the skin. 07/10/2016: Patient 14 units twice a day.   . latanoprost (XALATAN) 0.005 % ophthalmic solution Place 1 drop into both eyes at bedtime.   Marland Kitchen levothyroxine (SYNTHROID, LEVOTHROID) 88 MCG tablet Take 88 mcg by mouth daily before breakfast.   . losartan (COZAAR) 100 MG tablet Take 100 mg by mouth daily.     Marland Kitchen  omeprazole (PRILOSEC) 20 MG capsule Take 20 mg by mouth daily.   . ondansetron (ZOFRAN) 8 MG tablet Take 1 tablet (8 mg total) by mouth every 8 (eight) hours as needed for nausea or vomiting.    No facility-administered encounter medications on file as of 11/21/2016.     Functional Status:  In your present state of health, do you have any difficulty performing the following activities: 07/10/2016  Hearing? N  Vision? N  Difficulty concentrating or making decisions? N  Walking or climbing stairs? Y  Dressing or bathing? N  Doing errands, shopping? N  Preparing Food and eating ? N  Using the Toilet? N  In the past six months, have you accidently leaked urine? N  Do you have problems with loss of bowel control? N  Managing your Medications? N  Managing your Finances? N  Housekeeping or managing your Housekeeping? N  Some recent data might be hidden    Fall/Depression Screening: PHQ 2/9 Scores 11/21/2016 10/15/2016 09/19/2016 08/28/2016 07/10/2016 06/29/2016  PHQ - 2 Score 0 0 0 0 0 0    Assessment: Patient continues to benefit from health coach outreach for disease management and support.    Plan:  [facility] CM Care Plan Problem One   Flowsheet Row Most Recent Value  Care Plan Problem One  Diabetes Knowledge Deficit  Role Documenting the Problem One  [place]  for Problem One  Active  THN Long Term Goal (31-90 days)  Patient will keep A1c between 6-7 within the next 90 days.  THN Long Term Goal Start Date  11/21/16 [goal continued]  Interventions for Problem One Long Term Goal  RN Health Coach discussed with patient importance of maintaining diabetic diet to control A1c.  THN CM Short Term Goal #1 (0-30 days)  Patient will know A1c and doctor A1c goal within 30 days.  THN CM Short Term Goal #1 Start Date  10/15/16 [goal continued]  [facility] CM Short Term Goal #1 Met Date  11/21/16  Interventions for Short Term Goal #1  Patient able to give A1c of 6.4.     RN Health Coach  will contact patient in the month of March and patient agrees to next outreach.  [name], RN, MSN [place] [phone]

## 2016-12-19 ENCOUNTER — Other Ambulatory Visit: Payer: Self-pay

## 2016-12-19 NOTE — Patient Outreach (Signed)
[place] [facility]) Care Management  12/19/2016  Alexis Singh 14-Nov-1945 [id number]   Telephone call to patient for monthly call.  No answer.  HIPAA compliant voice message left.    Plan: RN Health Coach will attempt patient again in the month of March.  [name], RN, MSN [place] [phone]

## 2016-12-19 NOTE — Patient Outreach (Signed)
[place] [facility]) Care Management  [place]  12/19/2016   Alexis Singh [birth year] [id number]  Subjective: Telephone call to patient for monthly call.  Patient reports she is ok.  She reports that her blood sugar was 128 today.  She reports she had one sugar of 195 yesterday morning.  Discussed with patient how variations in eating schedule will cause blood sugar changes.  She verbalized understanding.  Also discussed with patient continuing diet to control sugars.  She verbalized understanding.    Objective:   Encounter Medications:  Outpatient Encounter Prescriptions as of 12/19/2016  Medication Sig Note  . amLODipine (NORVASC) 5 MG tablet Take 5 mg by mouth daily.   Marland Kitchen aspirin EC 81 MG tablet Take 81 mg by mouth daily.   Marland Kitchen atorvastatin (LIPITOR) 40 MG tablet Take 40 mg by mouth at bedtime.  07/10/2016: Patient forgets to take  . brimonidine (ALPHAGAN P) 0.1 % SOLN Place 1 drop into both eyes 2 (two) times daily.    . cholecalciferol (VITAMIN D) 1000 UNITS tablet Take 1,000 Units by mouth daily.   . dorzolamide-timolol (COSOPT) 22.3-6.8 MG/ML ophthalmic solution Place 1 drop into both eyes 2 (two) times daily.     . ferrous sulfate 325 (65 FE) MG tablet Take 325 mg by mouth daily.   . hydrocortisone (CORTEF) 10 MG tablet Take 10 mg by mouth daily.  10/15/2016: Patient reports taking 1 tab in am and 1/2 tab in the evening.  . insulin NPH-regular Human (NOVOLIN 70/30) (70-30) 100 UNIT/ML injection Inject into the skin. 07/10/2016: Patient 14 units twice a day.   . latanoprost (XALATAN) 0.005 % ophthalmic solution Place 1 drop into both eyes at bedtime.   Marland Kitchen levothyroxine (SYNTHROID, LEVOTHROID) 88 MCG tablet Take 88 mcg by mouth daily before breakfast.   . losartan (COZAAR) 100 MG tablet Take 100 mg by mouth daily.     Marland Kitchen omeprazole (PRILOSEC) 20 MG capsule Take 20 mg by mouth daily.   . ondansetron (ZOFRAN) 8 MG tablet Take 1 tablet (8 mg total) by mouth every 8  (eight) hours as needed for nausea or vomiting.    No facility-administered encounter medications on file as of 12/19/2016.     Functional Status:  In your present state of health, do you have any difficulty performing the following activities: 07/10/2016  Hearing? N  Vision? N  Difficulty concentrating or making decisions? N  Walking or climbing stairs? Y  Dressing or bathing? N  Doing errands, shopping? N  Preparing Food and eating ? N  Using the Toilet? N  In the past six months, have you accidently leaked urine? N  Do you have problems with loss of bowel control? N  Managing your Medications? N  Managing your Finances? N  Housekeeping or managing your Housekeeping? N  Some recent data might be hidden    Fall/Depression Screening: PHQ 2/9 Scores 12/19/2016 11/21/2016 10/15/2016 09/19/2016 08/28/2016 07/10/2016 06/29/2016  PHQ - 2 Score 0 0 0 0 0 0 0    Assessment: Patient continues to benefit from health coach outreach for disease management and support.    Plan:  [facility] CM Care Plan Problem One     Most Recent Value  Care Plan Problem One  Diabetes Knowledge Deficit  Role Documenting the Problem One  Health Coach  Care Plan for Problem One  Active  THN Long Term Goal (31-90 days)  Patient will keep A1c between 6-7 within the next 90 days.  THN Long  Term Goal Start Date  12/19/16  Interventions for Problem One [place] reviewed with patient importance of maintaining diabetic diet to control A1c.     RN Health Coach will contact patient in the month of April and patient agrees to next outreach.  [name], RN, MSN [place] [phone]

## 2017-01-15 ENCOUNTER — Other Ambulatory Visit: Payer: Self-pay

## 2017-01-15 NOTE — Patient Outreach (Signed)
[place] [facility]) Care Management  [place]  01/15/2017   RENNAE FERRAIOLO 03-19-1946 [id number]  Subjective: Telephone call to patient for monthly call. Patient reports some problems with nausea at times but otherwise ok. Patient reports last blood sugars 122 this morning and 166 last night.  Discussed with patient diabetic diet and continuing daily regimen.  She verbalized understanding.    Objective:   Encounter Medications:  Outpatient Encounter Prescriptions as of 01/15/2017  Medication Sig Note  . amLODipine (NORVASC) 5 MG tablet Take 5 mg by mouth daily.   Marland Kitchen aspirin EC 81 MG tablet Take 81 mg by mouth daily.   Marland Kitchen atorvastatin (LIPITOR) 40 MG tablet Take 40 mg by mouth at bedtime.  07/10/2016: Patient forgets to take  . brimonidine (ALPHAGAN P) 0.1 % SOLN Place 1 drop into both eyes 2 (two) times daily.    . cholecalciferol (VITAMIN D) 1000 UNITS tablet Take 1,000 Units by mouth daily.   . dorzolamide-timolol (COSOPT) 22.3-6.8 MG/ML ophthalmic solution Place 1 drop into both eyes 2 (two) times daily.     . ferrous sulfate 325 (65 FE) MG tablet Take 325 mg by mouth daily.   . hydrocortisone (CORTEF) 10 MG tablet Take 10 mg by mouth daily.  10/15/2016: Patient reports taking 1 tab in am and 1/2 tab in the evening.  . insulin NPH-regular Human (NOVOLIN 70/30) (70-30) 100 UNIT/ML injection Inject into the skin. 07/10/2016: Patient 14 units twice a day.   . latanoprost (XALATAN) 0.005 % ophthalmic solution Place 1 drop into both eyes at bedtime.   Marland Kitchen levothyroxine (SYNTHROID, LEVOTHROID) 88 MCG tablet Take 88 mcg by mouth daily before breakfast.   . losartan (COZAAR) 100 MG tablet Take 100 mg by mouth daily.     Marland Kitchen omeprazole (PRILOSEC) 20 MG capsule Take 20 mg by mouth daily.   . ondansetron (ZOFRAN) 8 MG tablet Take 1 tablet (8 mg total) by mouth every 8 (eight) hours as needed for nausea or vomiting.    No facility-administered encounter medications on file as of  01/15/2017.     Functional Status:  In your present state of health, do you have any difficulty performing the following activities: 07/10/2016  Hearing? N  Vision? N  Difficulty concentrating or making decisions? N  Walking or climbing stairs? Y  Dressing or bathing? N  Doing errands, shopping? N  Preparing Food and eating ? N  Using the Toilet? N  In the past six months, have you accidently leaked urine? N  Do you have problems with loss of bowel control? N  Managing your Medications? N  Managing your Finances? N  Housekeeping or managing your Housekeeping? N  Some recent data might be hidden    Fall/Depression Screening: PHQ 2/9 Scores 01/15/2017 12/19/2016 11/21/2016 10/15/2016 09/19/2016 08/28/2016 07/10/2016  PHQ - 2 Score 0 0 0 0 0 0 0    Assessment: Patient continues to benefit from health coach outreach for disease management and support.    Plan:  [facility] CM Care Plan Problem One     Most Recent Value  Care Plan Problem One  Diabetes Knowledge Deficit  Role Documenting the Problem One  Health Coach  Care Plan for Problem One  Active  THN Long Term Goal (31-90 days)  Patient will keep A1c between 6-7 within the next 90 days.  THN Long Term Goal Start Date  01/15/17  Interventions for Problem One [place] discussed with patient importance  of maintaining diabetic diet to control A1c.     [name] will contact patient in the month of May and patient agrees to next outreach.  [name], RN, MSN [place] [phone]

## 2017-01-23 DIAGNOSIS — H401133 Primary open-angle glaucoma, bilateral, severe stage: Secondary | ICD-10-CM | POA: Diagnosis not present

## 2017-02-05 ENCOUNTER — Other Ambulatory Visit: Payer: Self-pay

## 2017-02-05 NOTE — Patient Outreach (Signed)
[place] [facility]) Care Management  02/05/2017  AMYBETH SIEG 12-Dec-1945 [id number]   Telephone call to patient for monthly call.  Patient reports she cannot talk right now and asked for a call another time. Advised patient that I would reschedule her call.  She verbalized understanding.   Plan: RN Health Coach will attempt patient again in the month of May.  [name], RN, MSN [place] [phone]

## 2017-02-11 DIAGNOSIS — M810 Age-related osteoporosis without current pathological fracture: Secondary | ICD-10-CM | POA: Diagnosis not present

## 2017-02-11 DIAGNOSIS — E119 Type 2 diabetes mellitus without complications: Secondary | ICD-10-CM | POA: Diagnosis not present

## 2017-02-11 DIAGNOSIS — E893 Postprocedural hypopituitarism: Secondary | ICD-10-CM | POA: Diagnosis not present

## 2017-02-11 DIAGNOSIS — Z794 Long term (current) use of insulin: Secondary | ICD-10-CM | POA: Diagnosis not present

## 2017-02-13 ENCOUNTER — Encounter: Payer: Self-pay | Admitting: Gynecology

## 2017-02-18 ENCOUNTER — Ambulatory Visit: Payer: Self-pay

## 2017-02-20 ENCOUNTER — Other Ambulatory Visit: Payer: Self-pay

## 2017-02-20 NOTE — Patient Outreach (Signed)
[facility]) Care Management  02/20/2017  Alexis Singh Aug 13, 1946 [id number]   Telephone call to patient for monthly call.  No answer.  HIPAA compliant voice message left.    Plan: RN Health Coach will attempt patient again in the month of June.  [name], RN, MSN [place] [phone]

## 2017-03-06 ENCOUNTER — Other Ambulatory Visit: Payer: Self-pay

## 2017-03-06 NOTE — Patient Outreach (Signed)
[facility]) Care Management  [place]  03/06/2017   Alexis Singh 06-22-1946 [id number]  Subjective: Telephone call to patient for monthly call.  Patient reports she is doing ok.  She reports that her sugars have been about the same with last sugar of 134 this morning.  Patient reports that her synthroid was increased to 100 mcg as her levels were low.  Patient to follow up on July 11 th.  Reiterated with patient continuing diabetic diet and trying to increase activity. She verbalized understanding.     Objective:   Encounter Medications:  Outpatient Encounter Prescriptions as of 03/06/2017  Medication Sig Note  . amLODipine (NORVASC) 5 MG tablet Take 5 mg by mouth daily.   Marland Kitchen aspirin EC 81 MG tablet Take 81 mg by mouth daily.   Marland Kitchen atorvastatin (LIPITOR) 40 MG tablet Take 40 mg by mouth at bedtime.  07/10/2016: Patient forgets to take  . brimonidine (ALPHAGAN P) 0.1 % SOLN Place 1 drop into both eyes 2 (two) times daily.    . cholecalciferol (VITAMIN D) 1000 UNITS tablet Take 1,000 Units by mouth daily.   . dorzolamide-timolol (COSOPT) 22.3-6.8 MG/ML ophthalmic solution Place 1 drop into both eyes 2 (two) times daily.     . ferrous sulfate 325 (65 FE) MG tablet Take 325 mg by mouth daily.   . hydrocortisone (CORTEF) 10 MG tablet Take 10 mg by mouth daily.  10/15/2016: Patient reports taking 1 tab in am and 1/2 tab in the evening.  . insulin NPH-regular Human (NOVOLIN 70/30) (70-30) 100 UNIT/ML injection Inject into the skin. 07/10/2016: Patient 14 units twice a day.   . latanoprost (XALATAN) 0.005 % ophthalmic solution Place 1 drop into both eyes at bedtime.   Marland Kitchen levothyroxine (SYNTHROID, LEVOTHROID) 88 MCG tablet Take 88 mcg by mouth daily before breakfast. 03/06/2017: Patient reports taking 133mcg daily  . losartan (COZAAR) 100 MG tablet Take 100 mg by mouth daily.     Marland Kitchen omeprazole (PRILOSEC) 20 MG capsule Take 20 mg by mouth daily.   . ondansetron (ZOFRAN) 8 MG  tablet Take 1 tablet (8 mg total) by mouth every 8 (eight) hours as needed for nausea or vomiting.    No facility-administered encounter medications on file as of 03/06/2017.     Functional Status:  In your present state of health, do you have any difficulty performing the following activities: 07/10/2016  Hearing? N  Vision? N  Difficulty concentrating or making decisions? N  Walking or climbing stairs? Y  Dressing or bathing? N  Doing errands, shopping? N  Preparing Food and eating ? N  Using the Toilet? N  In the past six months, have you accidently leaked urine? N  Do you have problems with loss of bowel control? N  Managing your Medications? N  Managing your Finances? N  Housekeeping or managing your Housekeeping? N  Some recent data might be hidden    Fall/Depression Screening: Fall Risk  03/06/2017 01/15/2017 12/19/2016  Falls in the past year? No No No   PHQ 2/9 Scores 03/06/2017 01/15/2017 12/19/2016 11/21/2016 10/15/2016 09/19/2016 08/28/2016  PHQ - 2 Score 0 0 0 0 0 0 0    Assessment: Patient continues to benefit from health coach outreach for disease management and support.   Plan:  [facility] CM Care Plan Problem One     Most Recent Value  Care Plan Problem One  Diabetes Knowledge Deficit  Role Documenting the Problem One  Health Coach  Care  Plan for Problem One  Active  THN Long Term Goal   Patient will keep A1c between 6-7 within the next 90 days.  THN Long Term Goal Start Date  03/06/17  Interventions for Problem One [place] reiterated with patient importance of maintaining diabetic diet to control A1c.     RN Health Coach will contact patient in the month of July and patient agrees to next outreach.  [name], RN, MSN [place] [phone]

## 2017-04-02 ENCOUNTER — Other Ambulatory Visit: Payer: Self-pay

## 2017-04-02 NOTE — Patient Outreach (Signed)
[place] [facility]) Care Management  04/02/2017  Alexis Singh 07-Nov-1945 [id number]   Telephone call to patient for monthly call.  No answer.  HIPAA compliant voice message left.  Plan: RN Health Coach will attempt patient again in the month of July.  [name], RN, MSN [place] [phone]

## 2017-04-10 DIAGNOSIS — E893 Postprocedural hypopituitarism: Secondary | ICD-10-CM | POA: Diagnosis not present

## 2017-04-16 ENCOUNTER — Other Ambulatory Visit: Payer: Self-pay

## 2017-04-16 NOTE — Patient Outreach (Signed)
[facility]) Care Management  04/16/2017  AVIKA CARBINE June 07, 1946 [id number]   Telephone call to patient for monthly call.  Patient states she is over the road.  Advised patient that I could not talk to her as she is driving.  Advised that I would call her back another time and call dropped.  Plan:  RN Health Coach will attempt patient again in the month of July.    [name], RN, MSN [place] [phone]

## 2017-04-23 ENCOUNTER — Other Ambulatory Visit: Payer: Self-pay | Admitting: Gynecology

## 2017-04-23 DIAGNOSIS — Z1231 Encounter for screening mammogram for malignant neoplasm of breast: Secondary | ICD-10-CM

## 2017-04-25 ENCOUNTER — Other Ambulatory Visit: Payer: Self-pay

## 2017-04-25 NOTE — Patient Outreach (Signed)
[place] [facility]) Care Management  [place]  04/25/2017   Alexis Singh 09/30/1946 [id number]  Subjective: Telephone call to patient for monthly call. Patient reports she is doing good but her sugars have been up some.  She reports that today her sugar was 161.  Discussed with patient at length diet and scheduling meals.  Also discussed limiting carbohydrates and sweets.  She verbalized understanding.    Objective:   Encounter Medications:  Outpatient Encounter Prescriptions as of 04/25/2017  Medication Sig Note  . amLODipine (NORVASC) 5 MG tablet Take 5 mg by mouth daily.   Marland Kitchen aspirin EC 81 MG tablet Take 81 mg by mouth daily.   Marland Kitchen atorvastatin (LIPITOR) 40 MG tablet Take 40 mg by mouth at bedtime.  07/10/2016: Patient forgets to take  . brimonidine (ALPHAGAN P) 0.1 % SOLN Place 1 drop into both eyes 2 (two) times daily.    . cholecalciferol (VITAMIN D) 1000 UNITS tablet Take 1,000 Units by mouth daily.   . dorzolamide-timolol (COSOPT) 22.3-6.8 MG/ML ophthalmic solution Place 1 drop into both eyes 2 (two) times daily.     . ferrous sulfate 325 (65 FE) MG tablet Take 325 mg by mouth daily.   . hydrocortisone (CORTEF) 10 MG tablet Take 10 mg by mouth daily.  10/15/2016: Patient reports taking 1 tab in am and 1/2 tab in the evening.  . insulin NPH-regular Human (NOVOLIN 70/30) (70-30) 100 UNIT/ML injection Inject into the skin. 07/10/2016: Patient 14 units twice a day.   . latanoprost (XALATAN) 0.005 % ophthalmic solution Place 1 drop into both eyes at bedtime.   Marland Kitchen levothyroxine (SYNTHROID, LEVOTHROID) 88 MCG tablet Take 88 mcg by mouth daily before breakfast. 03/06/2017: Patient reports taking 170mcg daily  . losartan (COZAAR) 100 MG tablet Take 100 mg by mouth daily.     Marland Kitchen omeprazole (PRILOSEC) 20 MG capsule Take 20 mg by mouth daily.   . ondansetron (ZOFRAN) 8 MG tablet Take 1 tablet (8 mg total) by mouth every 8 (eight) hours as needed for nausea or vomiting.    No  facility-administered encounter medications on file as of 04/25/2017.     Functional Status:  In your present state of health, do you have any difficulty performing the following activities: 07/10/2016  Hearing? N  Vision? N  Difficulty concentrating or making decisions? N  Walking or climbing stairs? Y  Dressing or bathing? N  Doing errands, shopping? N  Preparing Food and eating ? N  Using the Toilet? N  In the past six months, have you accidently leaked urine? N  Do you have problems with loss of bowel control? N  Managing your Medications? N  Managing your Finances? N  Housekeeping or managing your Housekeeping? N  Some recent data might be hidden    Fall/Depression Screening: Fall Risk  04/25/2017 03/06/2017 01/15/2017  Falls in the past year? No No No   PHQ 2/9 Scores 04/25/2017 03/06/2017 01/15/2017 12/19/2016 11/21/2016 10/15/2016 09/19/2016  PHQ - 2 Score 0 0 0 0 0 0 0    Assessment: Patient continues to benefit from health coach outreach for disease management and support.    Plan:  [facility] CM Care Plan Problem One     Most Recent Value  Care Plan Problem One  Diabetes Knowledge Deficit  Role Documenting the Problem One  Health Coach  Care Plan for Problem One  Active  THN Long Term Goal   Patient will keep A1c between 6-7 within the next  90 days.  THN Long Term Goal Start Date  04/25/17 [goal continued]  Interventions for Problem One [place] reviewed with patient importance of maintaining diabetic diet to control A1c.     RN Health Coach will contact patient in the month of August and patient agrees to next outreach.  [name], RN, MSN [place] [phone]

## 2017-05-20 ENCOUNTER — Other Ambulatory Visit: Payer: Self-pay

## 2017-05-20 NOTE — Patient Outreach (Signed)
[place] [facility]) Care Management  05/20/2017  Alexis Singh May 01, 1946 [id number]   Telephone call to patient for monthly call.  Patient answers stating she cannot talk right now.  Advised patient that health coach would attempt her again another time. She verbalized understanding.    Plan: RN Health Coach will attempt patient again in the month of August.    [name] [name], RN, MSN [place] [phone]

## 2017-05-27 ENCOUNTER — Other Ambulatory Visit: Payer: Self-pay

## 2017-05-27 NOTE — Patient Outreach (Signed)
[place] [facility]) Care Management  05/27/2017  ZEINA AKKERMAN 1945/11/01 [id number]   2nd telephone call to patient for monthly call. No answer. HIPAA compliant voice message left.    Plan: RN Health Coach will attempt patient again in the month of August.    Jdyn Parkerson J Zulma Court, RN, MSN [place] [phone]

## 2017-05-31 ENCOUNTER — Other Ambulatory Visit: Payer: Self-pay

## 2017-05-31 NOTE — Patient Outreach (Signed)
[facility]) Care Management  [name]  05/31/2017   Alexis Singh [birth year] [id number]  Subjective: Incoming call from patient.  She reports she is doing ok.  She reports that her sugars are up at times and she is not sure why. Discussed with patient diet and scheduling of meals makes a difference of sugars the following morning.  She verbalized understanding.  Patient reports that her sugar this morning was 176.  Asked patient about the timing she ate.  She reports that it was after 7:00 pm. Advised her that it may explain why her sugar was increased this morning. She verbalized understanding.  However, advised patient to continue to monitor her sugar in order for physician to see increase as adjustments in medications may need to be made.  She verbalized understanding.    Objective:   Encounter Medications:  Outpatient Encounter Prescriptions as of 05/31/2017  Medication Sig Note  . amLODipine (NORVASC) 5 MG tablet Take 5 mg by mouth daily.   Marland Kitchen aspirin EC 81 MG tablet Take 81 mg by mouth daily.   Marland Kitchen atorvastatin (LIPITOR) 40 MG tablet Take 40 mg by mouth at bedtime.  07/10/2016: Patient forgets to take  . brimonidine (ALPHAGAN P) 0.1 % SOLN Place 1 drop into both eyes 2 (two) times daily.    . cholecalciferol (VITAMIN D) 1000 UNITS tablet Take 1,000 Units by mouth daily.   . dorzolamide-timolol (COSOPT) 22.3-6.8 MG/ML ophthalmic solution Place 1 drop into both eyes 2 (two) times daily.     . ferrous sulfate 325 (65 FE) MG tablet Take 325 mg by mouth daily.   . hydrocortisone (CORTEF) 10 MG tablet Take 10 mg by mouth daily.  10/15/2016: Patient reports taking 1 tab in am and 1/2 tab in the evening.  . insulin NPH-regular Human (NOVOLIN 70/30) (70-30) 100 UNIT/ML injection Inject into the skin. 07/10/2016: Patient 14 units twice a day.   . latanoprost (XALATAN) 0.005 % ophthalmic solution Place 1 drop into both eyes at bedtime.   Marland Kitchen levothyroxine (SYNTHROID,  LEVOTHROID) 88 MCG tablet Take 88 mcg by mouth daily before breakfast. 03/06/2017: Patient reports taking 164mcg daily  . losartan (COZAAR) 100 MG tablet Take 100 mg by mouth daily.     Marland Kitchen omeprazole (PRILOSEC) 20 MG capsule Take 20 mg by mouth daily.   . ondansetron (ZOFRAN) 8 MG tablet Take 1 tablet (8 mg total) by mouth every 8 (eight) hours as needed for nausea or vomiting.    No facility-administered encounter medications on file as of 05/31/2017.     Functional Status:  In your present state of health, do you have any difficulty performing the following activities: 07/10/2016  Hearing? N  Vision? N  Difficulty concentrating or making decisions? N  Walking or climbing stairs? Y  Dressing or bathing? N  Doing errands, shopping? N  Preparing Food and eating ? N  Using the Toilet? N  In the past six months, have you accidently leaked urine? N  Do you have problems with loss of bowel control? N  Managing your Medications? N  Managing your Finances? N  Housekeeping or managing your Housekeeping? N  Some recent data might be hidden    Fall/Depression Screening: Fall Risk  05/31/2017 04/25/2017 03/06/2017  Falls in the past year? No No No   PHQ 2/9 Scores 05/31/2017 04/25/2017 03/06/2017 01/15/2017 12/19/2016 11/21/2016 10/15/2016  PHQ - 2 Score 0 0 0 0 0 0 0    Assessment: Patient continues to  benefit from health coach outreach for disease management and support.    Plan:  [facility] CM Care Plan Problem One     Most Recent Value  Care Plan Problem One  Diabetes Knowledge Deficit  Role Documenting the Problem One  Health Coach  Care Plan for Problem One  Active  THN Long Term Goal   Patient will keep A1c between 6-7 within the next 90 days.  THN Long Term Goal Start Date  05/31/17 [goal continued]  Interventions for Problem One Long Term Goal  RN Health Coach reiterated with patient importance of maintaining diabetic diet to control A1c.     RN Health Coach will contact patient in the month  of September and patient agrees to next outreach.  [name], RN, MSN [place] [phone]

## 2017-05-31 NOTE — Patient Outreach (Signed)
[place] [facility]) Care Management  05/31/2017  KAROLEE MELONI Sep 04, 1946 [id number]   3rd telephone call to patient for monthly call. No answer.  HIPAA compliant voice message left.  Plan: RN Health Coach will send letter to attempt patient.  If no response within 10 business days will proceed with case closure.    [name], RN, MSN [place] [phone]

## 2017-06-05 ENCOUNTER — Ambulatory Visit: Payer: Commercial Managed Care - HMO

## 2017-06-06 ENCOUNTER — Ambulatory Visit
Admission: RE | Admit: 2017-06-06 | Discharge: 2017-06-06 | Disposition: A | Discharge status: Home / Self Care | Payer: Medicare HMO | Source: Ambulatory Visit | Attending: Gynecology | Admitting: Gynecology

## 2017-06-06 DIAGNOSIS — Z1231 Encounter for screening mammogram for malignant neoplasm of breast: Secondary | ICD-10-CM

## 2017-06-21 ENCOUNTER — Other Ambulatory Visit: Payer: Self-pay

## 2017-06-21 NOTE — Patient Outreach (Signed)
[facility]) Care Management  06/21/2017  Alexis Singh 1946/07/25 [id number]   2nd telephone call to patient for monthly call.  No answer. HIPAA compliant voice message left.  Plan: RN Health Coach will attempt patient again in the month of September.  [name], RN, MSN [name] [phone]

## 2017-06-26 ENCOUNTER — Other Ambulatory Visit: Payer: Self-pay

## 2017-06-26 DIAGNOSIS — H401133 Primary open-angle glaucoma, bilateral, severe stage: Secondary | ICD-10-CM | POA: Diagnosis not present

## 2017-06-26 NOTE — Patient Outreach (Signed)
[place] [facility]) Care Management  06/26/2017  Alexis Singh Apr 30, 1946 [id number]   2nd Telephone call to patient for monthly call. No answer.  HIPAA compliant voice message left.   Plan: RN Health Coach will attempt patient again in the month of October.  [name], RN, MSN [place] [phone]

## 2017-07-02 ENCOUNTER — Ambulatory Visit: Payer: Self-pay

## 2017-07-03 ENCOUNTER — Ambulatory Visit: Payer: Self-pay

## 2017-07-03 DIAGNOSIS — H2513 Age-related nuclear cataract, bilateral: Secondary | ICD-10-CM | POA: Diagnosis not present

## 2017-07-03 DIAGNOSIS — H401133 Primary open-angle glaucoma, bilateral, severe stage: Secondary | ICD-10-CM | POA: Diagnosis not present

## 2017-07-03 DIAGNOSIS — E119 Type 2 diabetes mellitus without complications: Secondary | ICD-10-CM | POA: Diagnosis not present

## 2017-07-03 DIAGNOSIS — H25013 Cortical age-related cataract, bilateral: Secondary | ICD-10-CM | POA: Diagnosis not present

## 2017-07-10 ENCOUNTER — Ambulatory Visit (INDEPENDENT_AMBULATORY_CARE_PROVIDER_SITE_OTHER): Payer: Medicare HMO | Admitting: Women's Health

## 2017-07-10 ENCOUNTER — Encounter: Payer: Self-pay | Admitting: Women's Health

## 2017-07-10 VITALS — BP 130/80 | Ht 64.0 in | Wt 192.0 lb

## 2017-07-10 DIAGNOSIS — Z01419 Encounter for gynecological examination (general) (routine) without abnormal findings: Secondary | ICD-10-CM

## 2017-07-10 DIAGNOSIS — Z23 Encounter for immunization: Secondary | ICD-10-CM | POA: Diagnosis not present

## 2017-07-10 NOTE — Patient Instructions (Signed)
Health Maintenance for Postmenopausal Women Menopause is a normal process in which your reproductive ability comes to an end. This process happens gradually over a span of months to years, usually between the ages of 22 and 9. Menopause is complete when you have missed 12 consecutive menstrual periods. It is important to talk with your health care provider about some of the most common conditions that affect postmenopausal women, such as heart disease, cancer, and bone loss (osteoporosis). Adopting a healthy lifestyle and getting preventive care can help to promote your health and wellness. Those actions can also lower your chances of developing some of these common conditions. What should I know about menopause? During menopause, you may experience a number of symptoms, such as:  Moderate-to-severe hot flashes.  Night sweats.  Decrease in sex drive.  Mood swings.  Headaches.  Tiredness.  Irritability.  Memory problems.  Insomnia.  Choosing to treat or not to treat menopausal changes is an individual decision that you make with your health care provider. What should I know about hormone replacement therapy and supplements? Hormone therapy products are effective for treating symptoms that are associated with menopause, such as hot flashes and night sweats. Hormone replacement carries certain risks, especially as you become older. If you are thinking about using estrogen or estrogen with progestin treatments, discuss the benefits and risks with your health care provider. What should I know about heart disease and stroke? Heart disease, heart attack, and stroke become more likely as you age. This may be due, in part, to the hormonal changes that your body experiences during menopause. These can affect how your body processes dietary fats, triglycerides, and cholesterol. Heart attack and stroke are both medical emergencies. There are many things that you can do to help prevent heart disease  and stroke:  Have your blood pressure checked at least every 1-2 years. High blood pressure causes heart disease and increases the risk of stroke.  If you are 53-22 years old, ask your health care provider if you should take aspirin to prevent a heart attack or a stroke.  Do not use any tobacco products, including cigarettes, chewing tobacco, or electronic cigarettes. If you need help quitting, ask your health care provider.  It is important to eat a healthy diet and maintain a healthy weight. ? Be sure to include plenty of vegetables, fruits, low-fat dairy products, and lean protein. ? Avoid eating foods that are high in solid fats, added sugars, or salt (sodium).  Get regular exercise. This is one of the most important things that you can do for your health. ? Try to exercise for at least 150 minutes each week. The type of exercise that you do should increase your heart rate and make you sweat. This is known as moderate-intensity exercise. ? Try to do strengthening exercises at least twice each week. Do these in addition to the moderate-intensity exercise.  Know your numbers.Ask your health care provider to check your cholesterol and your blood glucose. Continue to have your blood tested as directed by your health care provider.  What should I know about cancer screening? There are several types of cancer. Take the following steps to reduce your risk and to catch any cancer development as early as possible. Breast Cancer  Practice breast self-awareness. ? This means understanding how your breasts normally appear and feel. ? It also means doing regular breast self-exams. Let your health care provider know about any changes, no matter how small.  If you are 40  or older, have a clinician do a breast exam (clinical breast exam or CBE) every year. Depending on your age, family history, and medical history, it may be recommended that you also have a yearly breast X-ray (mammogram).  If you  have a family history of breast cancer, talk with your health care provider about genetic screening.  If you are at high risk for breast cancer, talk with your health care provider about having an MRI and a mammogram every year.  Breast cancer (BRCA) gene test is recommended for women who have family members with BRCA-related cancers. Results of the assessment will determine the need for genetic counseling and BRCA1 and for BRCA2 testing. BRCA-related cancers include these types: ? Breast. This occurs in males or females. ? Ovarian. ? Tubal. This may also be called fallopian tube cancer. ? Cancer of the abdominal or pelvic lining (peritoneal cancer). ? Prostate. ? Pancreatic.  Cervical, Uterine, and Ovarian Cancer Your health care provider may recommend that you be screened regularly for cancer of the pelvic organs. These include your ovaries, uterus, and vagina. This screening involves a pelvic exam, which includes checking for microscopic changes to the surface of your cervix (Pap test).  For women ages 21-65, health care providers may recommend a pelvic exam and a Pap test every three years. For women ages 79-65, they may recommend the Pap test and pelvic exam, combined with testing for human papilloma virus (HPV), every five years. Some types of HPV increase your risk of cervical cancer. Testing for HPV may also be done on women of any age who have unclear Pap test results.  Other health care providers may not recommend any screening for nonpregnant women who are considered low risk for pelvic cancer and have no symptoms. Ask your health care provider if a screening pelvic exam is right for you.  If you have had past treatment for cervical cancer or a condition that could lead to cancer, you need Pap tests and screening for cancer for at least 20 years after your treatment. If Pap tests have been discontinued for you, your risk factors (such as having a new sexual partner) need to be  reassessed to determine if you should start having screenings again. Some women have medical problems that increase the chance of getting cervical cancer. In these cases, your health care provider may recommend that you have screening and Pap tests more often.  If you have a family history of uterine cancer or ovarian cancer, talk with your health care provider about genetic screening.  If you have vaginal bleeding after reaching menopause, tell your health care provider.  There are currently no reliable tests available to screen for ovarian cancer.  Lung Cancer Lung cancer screening is recommended for adults 69-62 years old who are at high risk for lung cancer because of a history of smoking. A yearly low-dose CT scan of the lungs is recommended if you:  Currently smoke.  Have a history of at least 30 pack-years of smoking and you currently smoke or have quit within the past 15 years. A pack-year is smoking an average of one pack of cigarettes per day for one year.  Yearly screening should:  Continue until it has been 15 years since you quit.  Stop if you develop a health problem that would prevent you from having lung cancer treatment.  Colorectal Cancer  This type of cancer can be detected and can often be prevented.  Routine colorectal cancer screening usually begins at  age 42 and continues through age 45.  If you have risk factors for colon cancer, your health care provider may recommend that you be screened at an earlier age.  If you have a family history of colorectal cancer, talk with your health care provider about genetic screening.  Your health care provider may also recommend using home test kits to check for hidden blood in your stool.  A small camera at the end of a tube can be used to examine your colon directly (sigmoidoscopy or colonoscopy). This is done to check for the earliest forms of colorectal cancer.  Direct examination of the colon should be repeated every  5-10 years until age 71. However, if early forms of precancerous polyps or small growths are found or if you have a family history or genetic risk for colorectal cancer, you may need to be screened more often.  Skin Cancer  Check your skin from head to toe regularly.  Monitor any moles. Be sure to tell your health care provider: ? About any new moles or changes in moles, especially if there is a change in a mole's shape or color. ? If you have a mole that is larger than the size of a pencil eraser.  If any of your family members has a history of skin cancer, especially at a Tayler Heiden age, talk with your health care provider about genetic screening.  Always use sunscreen. Apply sunscreen liberally and repeatedly throughout the day.  Whenever you are outside, protect yourself by wearing long sleeves, pants, a wide-brimmed hat, and sunglasses.  What should I know about osteoporosis? Osteoporosis is a condition in which bone destruction happens more quickly than new bone creation. After menopause, you may be at an increased risk for osteoporosis. To help prevent osteoporosis or the bone fractures that can happen because of osteoporosis, the following is recommended:  If you are 46-71 years old, get at least 1,000 mg of calcium and at least 600 mg of vitamin D per day.  If you are older than age 55 but younger than age 65, get at least 1,200 mg of calcium and at least 600 mg of vitamin D per day.  If you are older than age 54, get at least 1,200 mg of calcium and at least 800 mg of vitamin D per day.  Smoking and excessive alcohol intake increase the risk of osteoporosis. Eat foods that are rich in calcium and vitamin D, and do weight-bearing exercises several times each week as directed by your health care provider. What should I know about how menopause affects my mental health? Depression may occur at any age, but it is more common as you become older. Common symptoms of depression  include:  Low or sad mood.  Changes in sleep patterns.  Changes in appetite or eating patterns.  Feeling an overall lack of motivation or enjoyment of activities that you previously enjoyed.  Frequent crying spells.  Talk with your health care provider if you think that you are experiencing depression. What should I know about immunizations? It is important that you get and maintain your immunizations. These include:  Tetanus, diphtheria, and pertussis (Tdap) booster vaccine.  Influenza every year before the flu season begins.  Pneumonia vaccine.  Shingles vaccine.  Your health care provider may also recommend other immunizations. This information is not intended to replace advice given to you by your health care provider. Make sure you discuss any questions you have with your health care provider. Document Released: 11/09/2005  Document Revised: 04/06/2016 Document Reviewed: 06/21/2015 Elsevier Interactive Patient Education  2018 Elsevier Inc.  

## 2017-07-10 NOTE — Progress Notes (Signed)
Alexis Singh 1946/03/11 [id number]    History:    Presents for breast and pelvic exam. TAH on no HRT . 1994 left breast cancer normal mammograms after. History of osteoporosis endocrinologist managing. Hypertension, GERD, diabetes and hypercholesterolemia managed by primary care. 2011 negative colonoscopy. Unsure of vaccines.  Past medical history, past surgical history, family history and social history were all reviewed and documented in the EPIC chart. Retired Oncologist. 1 son and 3 grandchildren all doing well.  ROS:  A ROS was performed and pertinent positives and negatives are included.  Exam:  Vitals:   07/10/17 0957  BP: 130/80  Weight: 192 lb (87.1 kg)  Height: 5\' 4"  (1.626 m)   Body mass index is 32.96 kg/m.   General appearance:  Normal Thyroid:  Symmetrical, normal in size, without palpable masses or nodularity. Respiratory  Auscultation:  Clear without wheezing or rhonchi Cardiovascular  Auscultation:  Regular rate, without rubs, murmurs or gallops  Edema/varicosities:  Not grossly evident Abdominal  Soft,nontender, without masses, guarding or rebound.  Liver/spleen:  No organomegaly noted  Hernia:  None appreciated  Skin  Inspection:  Grossly normal   Breasts: Examined lying and sitting.     Right: Without masses, retractions, discharge or axillary adenopathy.     Left: Well-healed incision, Without masses, retractions, discharge or axillary adenopathy. Gentitourinary   Inguinal/mons:  Normal without inguinal adenopathy  External genitalia:  Normal  BUS/Urethra/Skene's glands:  Normal  Vagina:  Normal  Cervix:  uterus absent Adnexa/parametria:     Rt: Without masses or tenderness.   Lt: Without masses or tenderness.  Anus and perineum: Normal  Digital rectal exam: Normal sphincter tone without palpated masses or tenderness  Assessment/Plan:  71 y.o. MBF G2 P1 for breast and pelvic exam with no complaints.  TAH/no  HRT Hypertension/GERD/diabetes/hypercholesterolemia-primary care manages labs and meds Hyperthyroid, osteoporosis  - endocrinologist manages meds and DEXA.  Plan: Shingrex vaccine recommended, will check with primary care to see if she has had Pneumovax. SBE's, continue annual screening mammogram, exercise, calcium rich diet, vitamin D 2000 daily encouraged. Home safety, fall prevention and importance of weightbearing exercise reviewed. Pap screening guidelines reviewed.   [place], 11:16 AM 07/10/2017

## 2017-07-16 ENCOUNTER — Other Ambulatory Visit: Payer: Self-pay

## 2017-07-16 NOTE — Patient Outreach (Signed)
[name] Care Management  [place]  07/16/2017   Alexis Singh January 28, 1946 [id number]  Subjective: Telephone call to patient for monthly call.  Patient reports she is doing ok but her sugars have been elevated.  She reports some sugars in the 200's but reports that her sugar today was 167.  Reinforced with patient the importance of diet and timing of meals in relation to her blood sugars.  She verbalized understanding.  Patient reports that she has an appointment with her primary care doctor in November.  Advised patient to see what her A1c is on her next visit and if her physician needs to make changes at that time he will.  She verbalized understanding.    Objective:   Encounter Medications:  Outpatient Encounter Prescriptions as of 07/16/2017  Medication Sig Note  . amLODipine (NORVASC) 5 MG tablet Take 5 mg by mouth daily.   Marland Kitchen aspirin EC 81 MG tablet Take 81 mg by mouth daily.   . brimonidine (ALPHAGAN P) 0.1 % SOLN Place 1 drop into both eyes 2 (two) times daily.    . cholecalciferol (VITAMIN D) 1000 UNITS tablet Take 1,000 Units by mouth daily.   . dorzolamide-timolol (COSOPT) 22.3-6.8 MG/ML ophthalmic solution Place 1 drop into both eyes 2 (two) times daily.     . ferrous sulfate 325 (65 FE) MG tablet Take 325 mg by mouth daily.   . hydrocortisone (CORTEF) 10 MG tablet Take 10 mg by mouth daily.  10/15/2016: Patient reports taking 1 tab in am and 1/2 tab in the evening.  . insulin NPH-regular Human (NOVOLIN 70/30) (70-30) 100 UNIT/ML injection Inject into the skin. 07/10/2016: Patient 14 units twice a day.   . latanoprost (XALATAN) 0.005 % ophthalmic solution Place 1 drop into both eyes at bedtime.   Marland Kitchen levothyroxine (SYNTHROID, LEVOTHROID) 88 MCG tablet Take 88 mcg by mouth daily before breakfast. 03/06/2017: Patient reports taking 118mcg daily  . losartan (COZAAR) 100 MG tablet Take 100 mg by mouth daily.     Marland Kitchen omeprazole (PRILOSEC) 20 MG capsule Take  20 mg by mouth daily.   . ondansetron (ZOFRAN) 8 MG tablet Take 1 tablet (8 mg total) by mouth every 8 (eight) hours as needed for nausea or vomiting.   . Red Yeast Rice Extract (RED YEAST RICE PO) Take 1 tablet by mouth daily.    No facility-administered encounter medications on file as of 07/16/2017.     Functional Status:  No flowsheet data found.  Fall/Depression Screening: Fall Risk  07/16/2017 05/31/2017 04/25/2017  Falls in the past year? No No No   PHQ 2/9 Scores 07/16/2017 05/31/2017 04/25/2017 03/06/2017 01/15/2017 12/19/2016 11/21/2016  PHQ - 2 Score 0 0 0 0 0 0 0    Assessment: Patient continues to benefit from health coach outreach for disease management and support.    Plan:  [facility] CM Care Plan Problem One     Most Recent Value  Care Plan Problem One  Diabetes Knowledge Deficit  Role Documenting the Problem One  Health Coach  Care Plan for Problem One  Active  [facility] Long Term Goal   Patient will keep A1c between 6-7 within the next 90 days.  [facility] Long Term Goal Start Date  07/16/17 [goal continued]  Interventions for Problem One Long Term Goal  RN Health Coach reinforced with patient importance of maintaining diabetic diet to control A1c.     RN Health Coach will contact patient in the month of November and  patient agrees to next outreach.  [name], RN, MSN [facility] Care Management Care Management Coordinator Direct Line [phone] Toll Free: [phone]  Fax: [phone]

## 2017-08-13 ENCOUNTER — Other Ambulatory Visit: Payer: Self-pay

## 2017-08-13 NOTE — Patient Outreach (Signed)
[facility] [facility]) Care Management  [place]  08/13/2017   Alexis Singh 12/22/1945 [id number]  Subjective: Telephone call to patient for monthly call. Patient reports she is doing good. She reports that her sugars are better and that she had a couple of low sugars over the weekend.  Discussed with patient eating schedule and insulin dose and how that makes a difference in her sugars running low and high.  She verbalized understanding.  Patient to see primary doctor on 08-15-17.   Objective:   Encounter Medications:  Outpatient Encounter Medications as of 08/13/2017  Medication Sig Note  . amLODipine (NORVASC) 5 MG tablet Take 5 mg by mouth daily.   Marland Kitchen aspirin EC 81 MG tablet Take 81 mg by mouth daily.   . brimonidine (ALPHAGAN P) 0.1 % SOLN Place 1 drop into both eyes 2 (two) times daily.    . cholecalciferol (VITAMIN D) 1000 UNITS tablet Take 1,000 Units by mouth daily.   . dorzolamide-timolol (COSOPT) 22.3-6.8 MG/ML ophthalmic solution Place 1 drop into both eyes 2 (two) times daily.     . ferrous sulfate 325 (65 FE) MG tablet Take 325 mg by mouth daily.   . hydrocortisone (CORTEF) 10 MG tablet Take 10 mg by mouth daily.  10/15/2016: Patient reports taking 1 tab in am and 1/2 tab in the evening.  . insulin NPH-regular Human (NOVOLIN 70/30) (70-30) 100 UNIT/ML injection Inject into the skin. 07/10/2016: Patient 14 units twice a day.   . latanoprost (XALATAN) 0.005 % ophthalmic solution Place 1 drop into both eyes at bedtime.   Marland Kitchen levothyroxine (SYNTHROID, LEVOTHROID) 88 MCG tablet Take 88 mcg by mouth daily before breakfast. 03/06/2017: Patient reports taking 178mcg daily  . losartan (COZAAR) 100 MG tablet Take 100 mg by mouth daily.     Marland Kitchen omeprazole (PRILOSEC) 20 MG capsule Take 20 mg by mouth daily.   . ondansetron (ZOFRAN) 8 MG tablet Take 1 tablet (8 mg total) by mouth every 8 (eight) hours as needed for nausea or vomiting.   . Red Yeast Rice Extract (RED YEAST  RICE PO) Take 1 tablet by mouth daily.    No facility-administered encounter medications on file as of 08/13/2017.     Functional Status:  No flowsheet data found.  Fall/Depression Screening: Fall Risk  08/13/2017 07/16/2017 05/31/2017  Falls in the past year? No No No   PHQ 2/9 Scores 08/13/2017 07/16/2017 05/31/2017 04/25/2017 03/06/2017 01/15/2017 12/19/2016  PHQ - 2 Score 0 0 0 0 0 0 0    Assessment: Patient continues to benefit from care manager outreach for disease management and support.    Plan:  [facility] CM Care Plan Problem One     Most Recent Value  Care Plan Problem One  Diabetes Knowledge Deficit  Role Documenting the Problem One  Health Coach  Care Plan for Problem One  Active  [facility] Long Term Goal   Patient will keep A1c between 6-7 within the next 90 days.  [facility] Long Term Goal Start Date  07/16/17 [goal continued]  Interventions for Problem One Long Term Goal  RN CM discussed with patient eating schedule and insulin times.  RN CM reinforced with patient importance of maintaining diabetic diet to control A1c.     RN CM will contact patient in the month of December and patient agrees to next outreach.  [name], RN, MSN [facility] Care Management Care Management Coordinator Direct Line [phone] Toll Free: [phone]  Fax: [phone]

## 2017-08-15 ENCOUNTER — Other Ambulatory Visit: Payer: Self-pay | Admitting: Internal Medicine

## 2017-08-15 ENCOUNTER — Ambulatory Visit
Admission: RE | Admit: 2017-08-15 | Discharge: 2017-08-15 | Disposition: A | Discharge status: Home / Self Care | Payer: Medicare HMO | Source: Ambulatory Visit | Attending: Internal Medicine | Admitting: Internal Medicine

## 2017-08-15 DIAGNOSIS — M79671 Pain in right foot: Secondary | ICD-10-CM

## 2017-08-15 DIAGNOSIS — M810 Age-related osteoporosis without current pathological fracture: Secondary | ICD-10-CM

## 2017-08-15 DIAGNOSIS — Z794 Long term (current) use of insulin: Secondary | ICD-10-CM | POA: Diagnosis not present

## 2017-08-15 DIAGNOSIS — E119 Type 2 diabetes mellitus without complications: Secondary | ICD-10-CM | POA: Diagnosis not present

## 2017-08-15 DIAGNOSIS — E893 Postprocedural hypopituitarism: Secondary | ICD-10-CM | POA: Diagnosis not present

## 2017-08-15 DIAGNOSIS — M7989 Other specified soft tissue disorders: Secondary | ICD-10-CM | POA: Diagnosis not present

## 2017-09-11 ENCOUNTER — Other Ambulatory Visit: Payer: Self-pay

## 2017-09-11 NOTE — Patient Outreach (Signed)
[facility]) Care Management  09/11/2017  AKIRE RENNERT [birth year] [id number]   Telephone call to patient for monthly call. No answer.  HIPAA compliant voice message left.  Plan: RN CM will attempt patient again in the month of December.   [name], RN, MSN [facility] Care Management Care Management Coordinator Direct Line [phone] Toll Free: [phone]  Fax: [phone]

## 2017-09-18 ENCOUNTER — Other Ambulatory Visit: Payer: Self-pay

## 2017-09-18 NOTE — Patient Outreach (Signed)
[place] [facility]) Care Management  09/18/2017  FRONA YOST 20-Nov-1945 [id number]   Telephone call to patient for monthly call.  No answer.  HIPAA compliant voice message left.    Plan: RN CM will attempt patient again within the next 30 days.   [name], RN, MSN [facility] Care Management Care Management Coordinator Direct Line [phone] Toll Free: [phone]  Fax: [phone]

## 2017-09-25 ENCOUNTER — Other Ambulatory Visit: Payer: Self-pay

## 2017-09-25 NOTE — Patient Outreach (Signed)
[name]) Care Management  09/25/2017  Alexis Singh 10-20-1945 [id number]   3rd telephone call to patient for monthly call.  No answer.  HIPAA compliant voice message left.  Plan: RN CM will send letter to attempt patient.  If no return call from patient within 10 business days will close case.    [name], RN, MSN [facility] Care Management Care Management Coordinator Direct Line [phone] Toll Free: [phone]  Fax: [phone]

## 2017-09-26 DIAGNOSIS — H401133 Primary open-angle glaucoma, bilateral, severe stage: Secondary | ICD-10-CM | POA: Diagnosis not present

## 2017-10-09 ENCOUNTER — Other Ambulatory Visit: Payer: Self-pay

## 2017-10-09 NOTE — Patient Outreach (Signed)
[facility]) Care Management  10/09/2017  Alexis Singh Oct 12, 1945 [id number]   Multiple attempts to establish contact with patient without success. No response from letter mailed to patient. Case is being closed at this time.   Plan: RN CM will notify care management assistant of case closure and send closure letters.  [name], RN, MSN [facility] Care Management Care Management Coordinator Direct Line [phone] Toll Free: [phone]  Fax: [phone]

## 2017-11-04 DIAGNOSIS — K210 Gastro-esophageal reflux disease with esophagitis: Secondary | ICD-10-CM | POA: Diagnosis not present

## 2017-11-04 DIAGNOSIS — E230 Hypopituitarism: Secondary | ICD-10-CM | POA: Diagnosis not present

## 2017-11-04 DIAGNOSIS — I10 Essential (primary) hypertension: Secondary | ICD-10-CM | POA: Diagnosis not present

## 2017-11-04 DIAGNOSIS — E119 Type 2 diabetes mellitus without complications: Secondary | ICD-10-CM | POA: Diagnosis not present

## 2017-11-27 DIAGNOSIS — H401133 Primary open-angle glaucoma, bilateral, severe stage: Secondary | ICD-10-CM | POA: Diagnosis not present

## 2017-11-28 DIAGNOSIS — E1122 Type 2 diabetes mellitus with diabetic chronic kidney disease: Secondary | ICD-10-CM | POA: Diagnosis not present

## 2017-11-28 DIAGNOSIS — N390 Urinary tract infection, site not specified: Secondary | ICD-10-CM | POA: Diagnosis not present

## 2017-11-28 DIAGNOSIS — E785 Hyperlipidemia, unspecified: Secondary | ICD-10-CM | POA: Diagnosis not present

## 2017-11-28 DIAGNOSIS — I129 Hypertensive chronic kidney disease with stage 1 through stage 4 chronic kidney disease, or unspecified chronic kidney disease: Secondary | ICD-10-CM | POA: Diagnosis not present

## 2017-11-28 DIAGNOSIS — E230 Hypopituitarism: Secondary | ICD-10-CM | POA: Diagnosis not present

## 2017-11-28 DIAGNOSIS — N2581 Secondary hyperparathyroidism of renal origin: Secondary | ICD-10-CM | POA: Diagnosis not present

## 2017-11-28 DIAGNOSIS — N183 Chronic kidney disease, stage 3 (moderate): Secondary | ICD-10-CM | POA: Diagnosis not present

## 2017-12-06 ENCOUNTER — Ambulatory Visit
Admission: RE | Admit: 2017-12-06 | Discharge: 2017-12-06 | Disposition: A | Discharge status: Home / Self Care | Payer: Medicare HMO | Source: Ambulatory Visit | Attending: Internal Medicine | Admitting: Internal Medicine

## 2017-12-06 ENCOUNTER — Other Ambulatory Visit: Payer: Self-pay | Admitting: Internal Medicine

## 2017-12-06 DIAGNOSIS — E119 Type 2 diabetes mellitus without complications: Secondary | ICD-10-CM | POA: Diagnosis not present

## 2017-12-06 DIAGNOSIS — R5383 Other fatigue: Secondary | ICD-10-CM | POA: Diagnosis not present

## 2017-12-06 DIAGNOSIS — M7989 Other specified soft tissue disorders: Secondary | ICD-10-CM | POA: Diagnosis not present

## 2017-12-06 DIAGNOSIS — R748 Abnormal levels of other serum enzymes: Secondary | ICD-10-CM | POA: Diagnosis not present

## 2017-12-06 DIAGNOSIS — M25572 Pain in left ankle and joints of left foot: Secondary | ICD-10-CM

## 2017-12-06 DIAGNOSIS — Z794 Long term (current) use of insulin: Secondary | ICD-10-CM | POA: Diagnosis not present

## 2017-12-06 DIAGNOSIS — E893 Postprocedural hypopituitarism: Secondary | ICD-10-CM | POA: Diagnosis not present

## 2017-12-06 DIAGNOSIS — D509 Iron deficiency anemia, unspecified: Secondary | ICD-10-CM | POA: Diagnosis not present

## 2017-12-06 DIAGNOSIS — M810 Age-related osteoporosis without current pathological fracture: Secondary | ICD-10-CM | POA: Diagnosis not present

## 2018-01-16 ENCOUNTER — Other Ambulatory Visit: Payer: Self-pay

## 2018-01-16 ENCOUNTER — Emergency Department (HOSPITAL_COMMUNITY)
Admission: EM | Admit: 2018-01-16 | Discharge: 2018-01-16 | Disposition: A | Discharge status: Home / Self Care | Payer: Medicare HMO | Attending: Emergency Medicine | Admitting: Emergency Medicine

## 2018-01-16 ENCOUNTER — Encounter (HOSPITAL_COMMUNITY): Payer: Self-pay | Admitting: *Deleted

## 2018-01-16 DIAGNOSIS — Y929 Unspecified place or not applicable: Secondary | ICD-10-CM | POA: Diagnosis not present

## 2018-01-16 DIAGNOSIS — E0590 Thyrotoxicosis, unspecified without thyrotoxic crisis or storm: Secondary | ICD-10-CM | POA: Insufficient documentation

## 2018-01-16 DIAGNOSIS — Z7982 Long term (current) use of aspirin: Secondary | ICD-10-CM | POA: Diagnosis not present

## 2018-01-16 DIAGNOSIS — I10 Essential (primary) hypertension: Secondary | ICD-10-CM | POA: Diagnosis not present

## 2018-01-16 DIAGNOSIS — N390 Urinary tract infection, site not specified: Secondary | ICD-10-CM | POA: Insufficient documentation

## 2018-01-16 DIAGNOSIS — X58XXXA Exposure to other specified factors, initial encounter: Secondary | ICD-10-CM | POA: Diagnosis not present

## 2018-01-16 DIAGNOSIS — E119 Type 2 diabetes mellitus without complications: Secondary | ICD-10-CM | POA: Diagnosis not present

## 2018-01-16 DIAGNOSIS — S39012A Strain of muscle, fascia and tendon of lower back, initial encounter: Secondary | ICD-10-CM

## 2018-01-16 DIAGNOSIS — Z794 Long term (current) use of insulin: Secondary | ICD-10-CM | POA: Insufficient documentation

## 2018-01-16 DIAGNOSIS — S3992XA Unspecified injury of lower back, initial encounter: Secondary | ICD-10-CM | POA: Diagnosis present

## 2018-01-16 DIAGNOSIS — Y939 Activity, unspecified: Secondary | ICD-10-CM | POA: Insufficient documentation

## 2018-01-16 DIAGNOSIS — Y999 Unspecified external cause status: Secondary | ICD-10-CM | POA: Diagnosis not present

## 2018-01-16 DIAGNOSIS — Z79899 Other long term (current) drug therapy: Secondary | ICD-10-CM | POA: Insufficient documentation

## 2018-01-16 LAB — URINALYSIS, ROUTINE W REFLEX MICROSCOPIC
Bilirubin Urine: NEGATIVE
GLUCOSE, UA: NEGATIVE mg/dL
Ketones, ur: NEGATIVE mg/dL
NITRITE: NEGATIVE
PH: 5 (ref 5.0–8.0)
Protein, ur: NEGATIVE mg/dL
Specific Gravity, Urine: 1.018 (ref 1.005–1.030)

## 2018-01-16 MED ORDER — CEPHALEXIN 500 MG PO CAPS
500.0000 mg | ORAL_CAPSULE | Freq: Four times a day (QID) | ORAL | 0 refills | Status: DC
Start: 2018-01-16 — End: 2018-07-14

## 2018-01-16 MED ORDER — TRAMADOL HCL 50 MG PO TABS: 50.0000 mg | ORAL_TABLET | Freq: Four times a day (QID) | ORAL | 0 refills | Status: DC | PRN

## 2018-01-16 MED ORDER — ONDANSETRON HCL 4 MG PO TABS
4.0000 mg | ORAL_TABLET | Freq: Once | ORAL | Status: AC
  Administered 2018-01-16: 4 mg via ORAL
  Filled 2018-01-16: qty 1

## 2018-01-16 MED ORDER — CEPHALEXIN 500 MG PO CAPS
500.0000 mg | ORAL_CAPSULE | Freq: Once | ORAL | Status: AC
  Administered 2018-01-16: 500 mg via ORAL
  Filled 2018-01-16: qty 1

## 2018-01-16 MED ORDER — ACETAMINOPHEN 500 MG PO TABS
1000.0000 mg | ORAL_TABLET | Freq: Once | ORAL | Status: AC
  Administered 2018-01-16: 1000 mg via ORAL
  Filled 2018-01-16: qty 2

## 2018-01-16 MED ORDER — METHOCARBAMOL 500 MG PO TABS: 500.0000 mg | ORAL_TABLET | Freq: Three times a day (TID) | ORAL | 0 refills | Status: DC

## 2018-01-16 NOTE — Discharge Instructions (Addendum)
Vital signs are within normal limits with the exception of the blood pressure being 159/88.  Please have this rechecked soon.  The urine test suggest a urinary tract infection.  Please start Keflex with breakfast, lunch, dinner, and at bedtime starting tomorrow.  Please use Tylenol extra strength every 4 hours.  Please use Robaxin 3 times daily for spasm pain.  This medication may cause drowsiness, please use it with caution.  May use Ultram for more severe pain if needed.  This medicine may also cause drowsiness, please use it with caution.  Please discuss your findings with Dr. Luan Pulling.  Please return to the emergency department if any changes in your condition, problems, or concerns.

## 2018-01-16 NOTE — ED Provider Notes (Addendum)
[facility] EMERGENCY DEPARTMENT Provider Note   CSN: [id number] Arrival date & time: 01/16/18  1739     History   Chief Complaint Chief Complaint  Patient presents with  . Back Pain    HPI Alexis Singh is a 72 y.o. female.  Patient is a 72 year old female who presents to the emergency department with a complaint of lower back pain.  The patient states that she has had some problems with spasm off and on for some time, but over the last 2-3 days the spasm pain seems to be getting worse.  The patient states that certain movements seems to aggravate the problem more than others.  She has not had any injury.  She does not remember injury doing any heavy lifting, pushing, pulling, or twisting.  She has not been in any recent accidents.  There is been no falls.  She has not had any difficulty with controlling her bowels or bladder.  She has not had any problem controlling her extremities.  She is never been told of any aneurysmal problem.  She presents now for assistance with this issue.     Past Medical History:  Diagnosis Date  . Anemia   . Arthritis   . Blood in urine   . Cancer ([place]) 1994   left breast  . Diabetes mellitus   . Fibroid   . GERD (gastroesophageal reflux disease)   . Glaucoma   . Headache(784.0)   . Hyperlipidemia   . Hypertension   . Hyperthyroidism   . Obesity   . Osteoporosis   . Pituitary tumor   . PONV (postoperative nausea and vomiting)   . Shortness of breath   . Thyroid disease    overactive thyroid    Patient Active Problem List   Diagnosis Date Noted  . Personal history of breast cancer 07/09/2016  . Seborrheic keratosis 08/23/2014  . Skin lesion of right leg 07/05/2014  . Closed trimalleolar fracture of right ankle 11/12/2013  . Thyroid disease   . Diabetes mellitus   . Glaucoma   . Cancer ([place])   . Osteoporosis   . Pituitary tumor   . Chest pain 01/19/2011  . Functional murmur 01/19/2011  . Hypertension   . Hyperlipidemia      Past Surgical History:  Procedure Laterality Date  . ABDOMINAL HYSTERECTOMY     TAH BSO  . BREAST LUMPECTOMY Left 1994   with removal of lymph nodes/had chemo and radiation  . CHOLECYSTECTOMY    . EYE SURGERY     Laser  . left wrist surgery    . OOPHORECTOMY     BSO  . ORIF ANKLE FRACTURE Right 11/12/2013   Procedure: OPEN REDUCTION INTERNAL FIXATION (ORIF) RIGHT ANKLE FRACTURE;  Surgeon: Wylene Simmer, MD;  Location: [place];  Service: Orthopedics;  Laterality: Right;  . Pituitary tumor removal  [place]  . TOE SURGERY    . TUBAL LIGATION    . Vein procedure    . WRIST FRACTURE SURGERY       OB History    Gravida  2   Para  1   Term  1   Preterm      AB  1   Living  1     SAB      TAB      Ectopic      Multiple      Live Births  Home Medications    Prior to Admission medications   Medication Sig Start Date End Date Taking? Authorizing Provider  amLODipine (NORVASC) 5 MG tablet Take 5 mg by mouth daily. 12/08/13   [provider]  aspirin EC 81 MG tablet Take 81 mg by mouth daily.    [provider]  brimonidine (ALPHAGAN P) 0.1 % SOLN Place 1 drop into both eyes 2 (two) times daily.     [provider]  cholecalciferol (VITAMIN D) 1000 UNITS tablet Take 1,000 Units by mouth daily.    [provider]  dorzolamide-timolol (COSOPT) 22.3-6.8 MG/ML ophthalmic solution Place 1 drop into both eyes 2 (two) times daily.      [provider]  ferrous sulfate 325 (65 FE) MG tablet Take 325 mg by mouth daily.    [provider]  hydrocortisone (CORTEF) 10 MG tablet Take 10 mg by mouth daily.     [provider]  insulin NPH-regular Human (NOVOLIN 70/30) (70-30) 100 UNIT/ML injection Inject into the skin.    [provider]  latanoprost (XALATAN) 0.005 % ophthalmic solution Place 1 drop into both eyes at bedtime.    [provider]  levothyroxine (SYNTHROID,  LEVOTHROID) 88 MCG tablet Take 88 mcg by mouth daily before breakfast.    [provider]  losartan (COZAAR) 100 MG tablet Take 100 mg by mouth daily.      [provider]  omeprazole (PRILOSEC) 20 MG capsule Take 20 mg by mouth daily.    [provider]  ondansetron (ZOFRAN) 8 MG tablet Take 1 tablet (8 mg total) by mouth every 8 (eight) hours as needed for nausea or vomiting. 10/02/14   [name], MD  Red Yeast Rice Extract (RED YEAST RICE PO) Take 1 tablet by mouth daily.    [provider]    Family History Family History  Problem Relation Age of Onset  . Breast cancer Mother        Age 44  . Hypertension Mother   . Heart failure Father   . Hypertension Sister   . Diabetes Brother   . Hypertension Brother   . Cancer Brother        Unsure what type    Social History Social History   Tobacco Use  . Smoking status: Never Smoker  . Smokeless tobacco: Never Used  Substance Use Topics  . Alcohol use: No    Alcohol/week: 0.0 oz  . Drug use: No     Allergies   Ambien [zolpidem tartrate]; Ciprofloxacin hcl; and Codeine   Review of Systems Review of Systems  Constitutional: Negative for activity change.       All ROS Neg except as noted in HPI  HENT: Negative for nosebleeds.   Eyes: Negative for photophobia and discharge.  Respiratory: Negative for cough, shortness of breath and wheezing.   Cardiovascular: Negative for chest pain and palpitations.  Gastrointestinal: Negative for abdominal pain and blood in stool.  Genitourinary: Negative for dysuria, frequency and hematuria.  Musculoskeletal: Positive for arthralgias and back pain. Negative for neck pain.  Skin: Negative.   Neurological: Negative for dizziness, seizures and speech difficulty.  Psychiatric/Behavioral: Negative for confusion and hallucinations.     Physical Exam Updated Vital Signs BP (!) 159/88 (BP Location: Right Arm)   Pulse 81   Temp 98.8 F (37.1 C)  (Oral)   Resp 14   Ht 5\' 4"  (1.626 m)   Wt 89.4 kg (197 lb)   SpO2 98%  BMI 33.81 kg/m   Physical Exam  Constitutional: She is oriented to person, place, and time. She appears well-developed and well-nourished.  Non-toxic appearance.  HENT:  Head: Normocephalic.  Right Ear: Tympanic membrane and external ear normal.  Left Ear: Tympanic membrane and external ear normal.  Eyes: Pupils are equal, round, and reactive to light. EOM and lids are normal.  Neck: Normal range of motion. Neck supple. Carotid bruit is not present.  Cardiovascular: Normal rate, regular rhythm, normal heart sounds, intact distal pulses and normal pulses.  Pulmonary/Chest: Breath sounds normal. No respiratory distress.  Abdominal: Soft. Bowel sounds are normal. There is no tenderness. There is no guarding.  There is no mass or pulsating mass appreciated of the abdomen.  Musculoskeletal: Normal range of motion.       Lumbar back: She exhibits spasm.       Back:  1+ pitting edema of right and left lower extremities.  No temperature changes noted of the extremities.  The radial pulses are 2+ bilaterally.  Lymphadenopathy:       Head (right side): No submandibular adenopathy present.       Head (left side): No submandibular adenopathy present.    She has no cervical adenopathy.  Neurological: She is alert and oriented to person, place, and time. She has normal strength. No cranial nerve deficit or sensory deficit.  Skin: Skin is warm and dry.  Psychiatric: She has a normal mood and affect. Her speech is normal.  Nursing note and vitals reviewed.    ED Treatments / Results  Labs (all labs ordered are listed, but only abnormal results are displayed) Labs Reviewed  URINALYSIS, ROUTINE W REFLEX MICROSCOPIC - Abnormal; Notable for the following components:      Result Value   APPearance HAZY (*)    Hgb urine dipstick SMALL (*)    Leukocytes, UA MODERATE (*)    Bacteria, UA RARE (*)    Squamous Epithelial /  LPF 0-5 (*)    All other components within normal limits    EKG None  Radiology No results found.  Procedures Procedures (including critical care time)  Medications Ordered in ED Medications - No data to display   Initial Impression / Assessment and Plan / ED Course  I have reviewed the triage vital signs and the nursing notes.  Pertinent labs & imaging results that were available during my care of the patient were reviewed by me and considered in my medical decision making (see chart for details).     Pt seen with me by Dr Lacinda Axon.  Final Clinical Impressions(s) / ED Diagnoses MDM  Blood pressure is 159/88, otherwise vital signs are within normal limits.  The patient has some left lower back pain.  No pulsating mass, no changes in pulses, no changes in temperature of extremities.  Pain is reproduced with range of motion.  I suspect a muscle strain involving the lumbar area.  The patient also has signs of a urinary tract infection.  Patient will be treated with Robaxin and Keflex.  Patient will follow up with Dr. Luan Pulling.  The patient will return to the emergency department if any changes in condition, problems, or concerns.  Patient is in agreement with this plan.   Final diagnoses:  Strain of lumbar region, initial encounter  Urinary tract infection without hematuria, site unspecified    ED Discharge Orders        Ordered    cephALEXin (KEFLEX) 500 MG capsule  4 times daily  01/16/18 2000    methocarbamol (ROBAXIN) 500 MG tablet  3 times daily     01/16/18 2000    traMADol (ULTRAM) 50 MG tablet  Every 6 hours PRN     01/16/18 2000       Lily Kocher, PA-C 01/16/18 2010    Lily Kocher, PA-C 01/16/18 2014    Nat Christen, MD 01/18/18 [phone]

## 2018-01-16 NOTE — ED Triage Notes (Signed)
C/O SPASMS IN HER LOW BACK

## 2018-01-18 LAB — URINE CULTURE
Culture: 70000 — AB
Special Requests: NORMAL

## 2018-03-14 DIAGNOSIS — M810 Age-related osteoporosis without current pathological fracture: Secondary | ICD-10-CM | POA: Diagnosis not present

## 2018-03-14 DIAGNOSIS — Z794 Long term (current) use of insulin: Secondary | ICD-10-CM | POA: Diagnosis not present

## 2018-03-14 DIAGNOSIS — E119 Type 2 diabetes mellitus without complications: Secondary | ICD-10-CM | POA: Diagnosis not present

## 2018-03-14 DIAGNOSIS — E893 Postprocedural hypopituitarism: Secondary | ICD-10-CM | POA: Diagnosis not present

## 2018-03-25 DIAGNOSIS — M722 Plantar fascial fibromatosis: Secondary | ICD-10-CM | POA: Diagnosis not present

## 2018-03-31 DIAGNOSIS — H401133 Primary open-angle glaucoma, bilateral, severe stage: Secondary | ICD-10-CM | POA: Diagnosis not present

## 2018-04-30 ENCOUNTER — Other Ambulatory Visit: Payer: Self-pay | Admitting: Women's Health

## 2018-04-30 DIAGNOSIS — M810 Age-related osteoporosis without current pathological fracture: Secondary | ICD-10-CM | POA: Diagnosis not present

## 2018-04-30 DIAGNOSIS — E893 Postprocedural hypopituitarism: Secondary | ICD-10-CM | POA: Diagnosis not present

## 2018-04-30 DIAGNOSIS — Z1231 Encounter for screening mammogram for malignant neoplasm of breast: Secondary | ICD-10-CM

## 2018-04-30 DIAGNOSIS — Z794 Long term (current) use of insulin: Secondary | ICD-10-CM | POA: Diagnosis not present

## 2018-04-30 DIAGNOSIS — E119 Type 2 diabetes mellitus without complications: Secondary | ICD-10-CM | POA: Diagnosis not present

## 2018-05-05 DIAGNOSIS — E119 Type 2 diabetes mellitus without complications: Secondary | ICD-10-CM | POA: Diagnosis not present

## 2018-05-05 DIAGNOSIS — I10 Essential (primary) hypertension: Secondary | ICD-10-CM | POA: Diagnosis not present

## 2018-05-05 DIAGNOSIS — J301 Allergic rhinitis due to pollen: Secondary | ICD-10-CM | POA: Diagnosis not present

## 2018-05-05 DIAGNOSIS — M545 Low back pain: Secondary | ICD-10-CM | POA: Diagnosis not present

## 2018-06-03 DIAGNOSIS — E230 Hypopituitarism: Secondary | ICD-10-CM | POA: Diagnosis not present

## 2018-06-03 DIAGNOSIS — I129 Hypertensive chronic kidney disease with stage 1 through stage 4 chronic kidney disease, or unspecified chronic kidney disease: Secondary | ICD-10-CM | POA: Diagnosis not present

## 2018-06-03 DIAGNOSIS — E785 Hyperlipidemia, unspecified: Secondary | ICD-10-CM | POA: Diagnosis not present

## 2018-06-03 DIAGNOSIS — N183 Chronic kidney disease, stage 3 (moderate): Secondary | ICD-10-CM | POA: Diagnosis not present

## 2018-06-03 DIAGNOSIS — D631 Anemia in chronic kidney disease: Secondary | ICD-10-CM | POA: Diagnosis not present

## 2018-06-03 DIAGNOSIS — E1122 Type 2 diabetes mellitus with diabetic chronic kidney disease: Secondary | ICD-10-CM | POA: Diagnosis not present

## 2018-06-09 ENCOUNTER — Ambulatory Visit
Admission: RE | Admit: 2018-06-09 | Discharge: 2018-06-09 | Disposition: A | Discharge status: Home / Self Care | Payer: Medicare HMO | Source: Ambulatory Visit | Attending: Women's Health | Admitting: Women's Health

## 2018-06-09 DIAGNOSIS — Z1231 Encounter for screening mammogram for malignant neoplasm of breast: Secondary | ICD-10-CM

## 2018-06-09 HISTORY — DX: Personal history of irradiation: Z92.3

## 2018-06-09 HISTORY — DX: Personal history of antineoplastic chemotherapy: Z92.21

## 2018-06-11 ENCOUNTER — Encounter (HOSPITAL_COMMUNITY)
Admission: RE | Admit: 2018-06-11 | Discharge: 2018-06-11 | Disposition: A | Discharge status: Home / Self Care | Payer: Medicare HMO | Source: Ambulatory Visit | Attending: Nephrology | Admitting: Nephrology

## 2018-06-11 ENCOUNTER — Encounter (HOSPITAL_COMMUNITY): Payer: Self-pay

## 2018-06-11 DIAGNOSIS — D631 Anemia in chronic kidney disease: Secondary | ICD-10-CM | POA: Insufficient documentation

## 2018-06-11 DIAGNOSIS — N189 Chronic kidney disease, unspecified: Secondary | ICD-10-CM | POA: Insufficient documentation

## 2018-06-11 MED ORDER — SODIUM CHLORIDE 0.9 % IV SOLN
Freq: Once | INTRAVENOUS | Status: AC
  Administered 2018-06-11: 12:00:00 via INTRAVENOUS

## 2018-06-11 MED ORDER — SODIUM CHLORIDE 0.9 % IV SOLN
510.0000 mg | Freq: Once | INTRAVENOUS | Status: AC
  Administered 2018-06-11: 510 mg via INTRAVENOUS
  Filled 2018-06-11: qty 17

## 2018-06-11 NOTE — Discharge Instructions (Signed)

## 2018-06-18 ENCOUNTER — Encounter (HOSPITAL_COMMUNITY)
Admission: RE | Admit: 2018-06-18 | Discharge: 2018-06-18 | Disposition: A | Discharge status: Home / Self Care | Payer: Medicare HMO | Source: Ambulatory Visit | Attending: Nephrology | Admitting: Nephrology

## 2018-06-18 ENCOUNTER — Encounter (HOSPITAL_COMMUNITY): Payer: Self-pay

## 2018-06-18 DIAGNOSIS — D631 Anemia in chronic kidney disease: Secondary | ICD-10-CM | POA: Diagnosis not present

## 2018-06-18 DIAGNOSIS — N189 Chronic kidney disease, unspecified: Secondary | ICD-10-CM | POA: Diagnosis not present

## 2018-06-18 MED ORDER — SODIUM CHLORIDE 0.9 % IV SOLN
Freq: Once | INTRAVENOUS | Status: AC
  Administered 2018-06-18: 12:00:00 via INTRAVENOUS

## 2018-06-18 MED ORDER — SODIUM CHLORIDE 0.9 % IV SOLN
510.0000 mg | Freq: Once | INTRAVENOUS | Status: AC
  Administered 2018-06-18: 510 mg via INTRAVENOUS
  Filled 2018-06-18: qty 17

## 2018-06-19 DIAGNOSIS — E119 Type 2 diabetes mellitus without complications: Secondary | ICD-10-CM | POA: Diagnosis not present

## 2018-06-19 DIAGNOSIS — Z794 Long term (current) use of insulin: Secondary | ICD-10-CM | POA: Diagnosis not present

## 2018-06-19 DIAGNOSIS — M810 Age-related osteoporosis without current pathological fracture: Secondary | ICD-10-CM | POA: Diagnosis not present

## 2018-06-19 DIAGNOSIS — E893 Postprocedural hypopituitarism: Secondary | ICD-10-CM | POA: Diagnosis not present

## 2018-07-08 DIAGNOSIS — H2513 Age-related nuclear cataract, bilateral: Secondary | ICD-10-CM | POA: Diagnosis not present

## 2018-07-08 DIAGNOSIS — H25013 Cortical age-related cataract, bilateral: Secondary | ICD-10-CM | POA: Diagnosis not present

## 2018-07-08 DIAGNOSIS — E119 Type 2 diabetes mellitus without complications: Secondary | ICD-10-CM | POA: Diagnosis not present

## 2018-07-08 DIAGNOSIS — H401133 Primary open-angle glaucoma, bilateral, severe stage: Secondary | ICD-10-CM | POA: Diagnosis not present

## 2018-07-14 ENCOUNTER — Encounter: Payer: Self-pay | Admitting: Women's Health

## 2018-07-14 ENCOUNTER — Ambulatory Visit: Payer: 59 | Admitting: Women's Health

## 2018-07-14 VITALS — BP 130/82 | Ht 64.0 in | Wt 188.0 lb

## 2018-07-14 DIAGNOSIS — Z23 Encounter for immunization: Secondary | ICD-10-CM

## 2018-07-14 DIAGNOSIS — Z9289 Personal history of other medical treatment: Secondary | ICD-10-CM

## 2018-07-14 DIAGNOSIS — Z01419 Encounter for gynecological examination (general) (routine) without abnormal findings: Secondary | ICD-10-CM | POA: Diagnosis not present

## 2018-07-14 NOTE — Patient Instructions (Signed)
Insomnia Insomnia is a sleep disorder that makes it difficult to fall asleep or to stay asleep. Insomnia can cause tiredness (fatigue), low energy, difficulty concentrating, mood swings, and poor performance at work or school. There are three different ways to classify insomnia:  Difficulty falling asleep.  Difficulty staying asleep.  Waking up too early in the morning.  Any type of insomnia can be long-term (chronic) or short-term (acute). Both are common. Short-term insomnia usually lasts for three months or less. Chronic insomnia occurs at least three times a week for longer than three months. What are the causes? Insomnia may be caused by another condition, situation, or substance, such as:  Anxiety.  Certain medicines.  Gastroesophageal reflux disease (GERD) or other gastrointestinal conditions.  Asthma or other breathing conditions.  Restless legs syndrome, sleep apnea, or other sleep disorders.  Chronic pain.  Menopause. This may include hot flashes.  Stroke.  Abuse of alcohol, tobacco, or illegal drugs.  Depression.  Caffeine.  Neurological disorders, such as Alzheimer disease.  An overactive thyroid (hyperthyroidism).  The cause of insomnia may not be known. What increases the risk? Risk factors for insomnia include:  Gender. Women are more commonly affected than men.  Age. Insomnia is more common as you get older.  Stress. This may involve your professional or personal life.  Income. Insomnia is more common in people with lower income.  Lack of exercise.  Irregular work schedule or night shifts.  Traveling between different time zones.  What are the signs or symptoms? If you have insomnia, trouble falling asleep or trouble staying asleep is the main symptom. This may lead to other symptoms, such as:  Feeling fatigued.  Feeling nervous about going to sleep.  Not feeling rested in the morning.  Having trouble concentrating.  Feeling  irritable, anxious, or depressed.  How is this treated? Treatment for insomnia depends on the cause. If your insomnia is caused by an underlying condition, treatment will focus on addressing the condition. Treatment may also include:  Medicines to help you sleep.  Counseling or therapy.  Lifestyle adjustments.  Follow these instructions at home:  Take medicines only as directed by your health care provider.  Keep regular sleeping and waking hours. Avoid naps.  Keep a sleep diary to help you and your health care provider figure out what could be causing your insomnia. Include: ? When you sleep. ? When you wake up during the night. ? How well you sleep. ? How rested you feel the next day. ? Any side effects of medicines you are taking. ? What you eat and drink.  Make your bedroom a comfortable place where it is easy to fall asleep: ? Put up shades or special blackout curtains to block light from outside. ? Use a white noise machine to block noise. ? Keep the temperature cool.  Exercise regularly as directed by your health care provider. Avoid exercising right before bedtime.  Use relaxation techniques to manage stress. Ask your health care provider to suggest some techniques that may work well for you. These may include: ? Breathing exercises. ? Routines to release muscle tension. ? Visualizing peaceful scenes.  Cut back on alcohol, caffeinated beverages, and cigarettes, especially close to bedtime. These can disrupt your sleep.  Do not overeat or eat spicy foods right before bedtime. This can lead to digestive discomfort that can make it hard for you to sleep.  Limit screen use before bedtime. This includes: ? Watching TV. ? Using your smartphone, tablet, and   computer.  Stick to a routine. This can help you fall asleep faster. Try to do a quiet activity, brush your teeth, and go to bed at the same time each night.  Get out of bed if you are still awake after 15 minutes  of trying to sleep. Keep the lights down, but try reading or doing a quiet activity. When you feel sleepy, go back to bed.  Make sure that you drive carefully. Avoid driving if you feel very sleepy.  Keep all follow-up appointments as directed by your health care provider. This is important. Contact a health care provider if:  You are tired throughout the day or have trouble in your daily routine due to sleepiness.  You continue to have sleep problems or your sleep problems get worse. Get help right away if:  You have serious thoughts about hurting yourself or someone else. This information is not intended to replace advice given to you by your health care provider. Make sure you discuss any questions you have with your health care provider. Document Released: 09/14/2000 Document Revised: 02/17/2016 Document Reviewed: 06/18/2014 Elsevier Interactive Patient Education  2018 [place] Maintenance for Postmenopausal Women Menopause is a normal process in which your reproductive ability comes to an end. This process happens gradually over a span of months to years, usually between the ages of 44 and 33. Menopause is complete when you have missed 12 consecutive menstrual periods. It is important to talk with your health care provider about some of the most common conditions that affect postmenopausal women, such as heart disease, cancer, and bone loss (osteoporosis). Adopting a healthy lifestyle and getting preventive care can help to promote your health and wellness. Those actions can also lower your chances of developing some of these common conditions. What should I know about menopause? During menopause, you may experience a number of symptoms, such as:  Moderate-to-severe hot flashes.  Night sweats.  Decrease in sex drive.  Mood swings.  Headaches.  Tiredness.  Irritability.  Memory problems.  Insomnia.  Choosing to treat or not to treat menopausal changes is an  individual decision that you make with your health care provider. What should I know about hormone replacement therapy and supplements? Hormone therapy products are effective for treating symptoms that are associated with menopause, such as hot flashes and night sweats. Hormone replacement carries certain risks, especially as you become older. If you are thinking about using estrogen or estrogen with progestin treatments, discuss the benefits and risks with your health care provider. What should I know about heart disease and stroke? Heart disease, heart attack, and stroke become more likely as you age. This may be due, in part, to the hormonal changes that your body experiences during menopause. These can affect how your body processes dietary fats, triglycerides, and cholesterol. Heart attack and stroke are both medical emergencies. There are many things that you can do to help prevent heart disease and stroke:  Have your blood pressure checked at least every 1-2 years. High blood pressure causes heart disease and increases the risk of stroke.  If you are 43-48 years old, ask your health care provider if you should take aspirin to prevent a heart attack or a stroke.  Do not use any tobacco products, including cigarettes, chewing tobacco, or electronic cigarettes. If you need help quitting, ask your health care provider.  It is important to eat a healthy diet and maintain a healthy weight. ? Be sure to include plenty of vegetables,  fruits, low-fat dairy products, and lean protein. ? Avoid eating foods that are high in solid fats, added sugars, or salt (sodium).  Get regular exercise. This is one of the most important things that you can do for your health. ? Try to exercise for at least 150 minutes each week. The type of exercise that you do should increase your heart rate and make you sweat. This is known as moderate-intensity exercise. ? Try to do strengthening exercises at least twice each  week. Do these in addition to the moderate-intensity exercise.  Know your numbers.Ask your health care provider to check your cholesterol and your blood glucose. Continue to have your blood tested as directed by your health care provider.  What should I know about cancer screening? There are several types of cancer. Take the following steps to reduce your risk and to catch any cancer development as early as possible. Breast Cancer  Practice breast self-awareness. ? This means understanding how your breasts normally appear and feel. ? It also means doing regular breast self-exams. Let your health care provider know about any changes, no matter how small.  If you are 4 or older, have a clinician do a breast exam (clinical breast exam or CBE) every year. Depending on your age, family history, and medical history, it may be recommended that you also have a yearly breast X-ray (mammogram).  If you have a family history of breast cancer, talk with your health care provider about genetic screening.  If you are at high risk for breast cancer, talk with your health care provider about having an MRI and a mammogram every year.  Breast cancer (BRCA) gene test is recommended for women who have family members with BRCA-related cancers. Results of the assessment will determine the need for genetic counseling and BRCA1 and for BRCA2 testing. BRCA-related cancers include these types: ? Breast. This occurs in males or females. ? Ovarian. ? Tubal. This may also be called fallopian tube cancer. ? Cancer of the abdominal or pelvic lining (peritoneal cancer). ? Prostate. ? Pancreatic.  Cervical, Uterine, and Ovarian Cancer Your health care provider may recommend that you be screened regularly for cancer of the pelvic organs. These include your ovaries, uterus, and vagina. This screening involves a pelvic exam, which includes checking for microscopic changes to the surface of your cervix (Pap test).  For  women ages 21-65, health care providers may recommend a pelvic exam and a Pap test every three years. For women ages 38-65, they may recommend the Pap test and pelvic exam, combined with testing for human papilloma virus (HPV), every five years. Some types of HPV increase your risk of cervical cancer. Testing for HPV may also be done on women of any age who have unclear Pap test results.  Other health care providers may not recommend any screening for nonpregnant women who are considered low risk for pelvic cancer and have no symptoms. Ask your health care provider if a screening pelvic exam is right for you.  If you have had past treatment for cervical cancer or a condition that could lead to cancer, you need Pap tests and screening for cancer for at least 20 years after your treatment. If Pap tests have been discontinued for you, your risk factors (such as having a new sexual partner) need to be reassessed to determine if you should start having screenings again. Some women have medical problems that increase the chance of getting cervical cancer. In these cases, your health care provider  may recommend that you have screening and Pap tests more often.  If you have a family history of uterine cancer or ovarian cancer, talk with your health care provider about genetic screening.  If you have vaginal bleeding after reaching menopause, tell your health care provider.  There are currently no reliable tests available to screen for ovarian cancer.  Lung Cancer Lung cancer screening is recommended for adults 54-36 years old who are at high risk for lung cancer because of a history of smoking. A yearly low-dose CT scan of the lungs is recommended if you:  Currently smoke.  Have a history of at least 30 pack-years of smoking and you currently smoke or have quit within the past 15 years. A pack-year is smoking an average of one pack of cigarettes per day for one year.  Yearly screening should:  Continue  until it has been 15 years since you quit.  Stop if you develop a health problem that would prevent you from having lung cancer treatment.  Colorectal Cancer  This type of cancer can be detected and can often be prevented.  Routine colorectal cancer screening usually begins at age 68 and continues through age 12.  If you have risk factors for colon cancer, your health care provider may recommend that you be screened at an earlier age.  If you have a family history of colorectal cancer, talk with your health care provider about genetic screening.  Your health care provider may also recommend using home test kits to check for hidden blood in your stool.  A small camera at the end of a tube can be used to examine your colon directly (sigmoidoscopy or colonoscopy). This is done to check for the earliest forms of colorectal cancer.  Direct examination of the colon should be repeated every 5-10 years until age 60. However, if early forms of precancerous polyps or small growths are found or if you have a family history or genetic risk for colorectal cancer, you may need to be screened more often.  Skin Cancer  Check your skin from head to toe regularly.  Monitor any moles. Be sure to tell your health care provider: ? About any new moles or changes in moles, especially if there is a change in a mole's shape or color. ? If you have a mole that is larger than the size of a pencil eraser.  If any of your family members has a history of skin cancer, especially at a young age, talk with your health care provider about genetic screening.  Always use sunscreen. Apply sunscreen liberally and repeatedly throughout the day.  Whenever you are outside, protect yourself by wearing long sleeves, pants, a wide-brimmed hat, and sunglasses.  What should I know about osteoporosis? Osteoporosis is a condition in which bone destruction happens more quickly than new bone creation. After menopause, you may be  at an increased risk for osteoporosis. To help prevent osteoporosis or the bone fractures that can happen because of osteoporosis, the following is recommended:  If you are 32-20 years old, get at least 1,000 mg of calcium and at least 600 mg of vitamin D per day.  If you are older than age 71 but younger than age 81, get at least 1,200 mg of calcium and at least 600 mg of vitamin D per day.  If you are older than age 51, get at least 1,200 mg of calcium and at least 800 mg of vitamin D per day.  Smoking and excessive  alcohol intake increase the risk of osteoporosis. Eat foods that are rich in calcium and vitamin D, and do weight-bearing exercises several times each week as directed by your health care provider. What should I know about how menopause affects my mental health? Depression may occur at any age, but it is more common as you become older. Common symptoms of depression include:  Low or sad mood.  Changes in sleep patterns.  Changes in appetite or eating patterns.  Feeling an overall lack of motivation or enjoyment of activities that you previously enjoyed.  Frequent crying spells.  Talk with your health care provider if you think that you are experiencing depression. What should I know about immunizations? It is important that you get and maintain your immunizations. These include:  Tetanus, diphtheria, and pertussis (Tdap) booster vaccine.  Influenza every year before the flu season begins.  Pneumonia vaccine.  Shingles vaccine.  Your health care provider may also recommend other immunizations. This information is not intended to replace advice given to you by your health care provider. Make sure you discuss any questions you have with your health care provider. Document Released: 11/09/2005 Document Revised: 04/06/2016 Document Reviewed: 06/21/2015 Elsevier Interactive Patient Education  2018 [employer].

## 2018-07-14 NOTE — Progress Notes (Signed)
Alexis Singh [birth year] [id number]    History:    Presents for breast and pelvic exam.   TAH for fibroids on no HRT.  1994 left breast cancer lumpectomy, chemotherapy and radiation with normal mammogram since.  2011- colonoscopy.  Has had Pneumovax but unsure of Shingrex.  Primary care manages hypertension, diabetes and hypercholesteremia.  Endocrinologist manages hyperthyroidism and bone density.  Glaucoma.  Past medical history, past surgical history, family history and social history were all reviewed and documented in the EPIC chart.  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  Husband healthy.  ROS:  A ROS was performed and pertinent positives and negatives are included.  Exam:  Vitals:   07/14/18 1127  BP: 130/82  Weight: 188 lb (85.3 kg)  Height: 5\' 4"  (1.626 m)   Body mass index is 32.27 kg/m.   General appearance:  Normal Thyroid:  Symmetrical, normal in size, without palpable masses or nodularity. Respiratory  Auscultation:  Clear without wheezing or rhonchi Cardiovascular  Auscultation:  Regular rate, without rubs, murmurs or gallops  Edema/varicosities:  Not grossly evident Abdominal  Soft,nontender, without masses, guarding or rebound.  Liver/spleen:  No organomegaly noted  Hernia:  None appreciated  Skin  Inspection:  Grossly normal   Breasts: Examined lying and sitting.     Right: Without masses, retractions, discharge or axillary adenopathy.     Left: Without masses, retractions, discharge or axillary adenopathy. Gentitourinary   Inguinal/mons:  Normal without inguinal adenopathy  External genitalia:  Normal  BUS/Urethra/Skene's glands:  Normal  Vagina:  Normal  Cervix: And uterus absent adnexa/parametria:     Rt: Without masses or tenderness.   Lt: Without masses or tenderness.  Anus and perineum: Normal  Digital rectal exam: Normal sphincter tone without palpated masses or tenderness  Assessment/Plan:  72 y.o. MPF G2, P1 for breast and pelvic exam with no  complaints.  TAH for fibroids on no HRT 1994 left breast cancer lumpectomy/chemotherapy/radiation no recurrence Glaucoma- ophthalmologist manages Hypertension, diabetes, hypercholesteremia-primary care manages labs and meds Osteoporosis endocrinologist manages  Plan: Continue SBE's, 3D mammograms reviewed and encouraged.  Increase regular weightbearing and balance type exercise, yoga encouraged.  Shingrex discussed and instructed to follow-up with primary care.  Return to office as needed.  Reviewed guidelines for mammograms and Paps.    Huel Cote [facility], 1:16 PM 07/14/2018

## 2018-08-23 DIAGNOSIS — H401133 Primary open-angle glaucoma, bilateral, severe stage: Secondary | ICD-10-CM | POA: Diagnosis not present

## 2018-09-04 DIAGNOSIS — E119 Type 2 diabetes mellitus without complications: Secondary | ICD-10-CM | POA: Diagnosis not present

## 2018-09-04 DIAGNOSIS — I10 Essential (primary) hypertension: Secondary | ICD-10-CM | POA: Diagnosis not present

## 2018-09-04 DIAGNOSIS — E230 Hypopituitarism: Secondary | ICD-10-CM | POA: Diagnosis not present

## 2018-09-04 DIAGNOSIS — E785 Hyperlipidemia, unspecified: Secondary | ICD-10-CM | POA: Diagnosis not present

## 2018-09-17 ENCOUNTER — Other Ambulatory Visit (HOSPITAL_COMMUNITY): Payer: Self-pay | Admitting: Pulmonary Disease

## 2018-09-17 ENCOUNTER — Ambulatory Visit (HOSPITAL_COMMUNITY)
Admission: RE | Admit: 2018-09-17 | Discharge: 2018-09-17 | Disposition: A | Discharge status: Home / Self Care | Payer: Medicare HMO | Source: Ambulatory Visit | Attending: Pulmonary Disease | Admitting: Pulmonary Disease

## 2018-09-17 DIAGNOSIS — E119 Type 2 diabetes mellitus without complications: Secondary | ICD-10-CM | POA: Diagnosis not present

## 2018-09-17 DIAGNOSIS — M542 Cervicalgia: Secondary | ICD-10-CM | POA: Insufficient documentation

## 2018-09-17 DIAGNOSIS — I10 Essential (primary) hypertension: Secondary | ICD-10-CM | POA: Diagnosis not present

## 2018-09-26 DIAGNOSIS — E893 Postprocedural hypopituitarism: Secondary | ICD-10-CM | POA: Diagnosis not present

## 2018-09-26 DIAGNOSIS — Z794 Long term (current) use of insulin: Secondary | ICD-10-CM | POA: Diagnosis not present

## 2018-09-26 DIAGNOSIS — E119 Type 2 diabetes mellitus without complications: Secondary | ICD-10-CM | POA: Diagnosis not present

## 2018-09-26 DIAGNOSIS — M810 Age-related osteoporosis without current pathological fracture: Secondary | ICD-10-CM | POA: Diagnosis not present

## 2018-10-29 ENCOUNTER — Encounter: Payer: Self-pay | Admitting: Podiatry

## 2018-10-29 ENCOUNTER — Ambulatory Visit: Payer: Medicare HMO | Admitting: Podiatry

## 2018-10-29 ENCOUNTER — Other Ambulatory Visit: Payer: Self-pay

## 2018-10-29 VITALS — BP 134/76 | HR 66

## 2018-10-29 DIAGNOSIS — E0843 Diabetes mellitus due to underlying condition with diabetic autonomic (poly)neuropathy: Secondary | ICD-10-CM | POA: Diagnosis not present

## 2018-10-29 DIAGNOSIS — M659 Synovitis and tenosynovitis, unspecified: Secondary | ICD-10-CM

## 2018-10-29 DIAGNOSIS — M79676 Pain in unspecified toe(s): Secondary | ICD-10-CM | POA: Diagnosis not present

## 2018-10-29 DIAGNOSIS — M65971 Unspecified synovitis and tenosynovitis, right ankle and foot: Secondary | ICD-10-CM

## 2018-10-29 DIAGNOSIS — B351 Tinea unguium: Secondary | ICD-10-CM

## 2018-11-05 NOTE — Progress Notes (Signed)
   SUBJECTIVE Patient with a history of diabetes mellitus presents to office today complaining of elongated, thickened nails that cause pain while ambulating in shoes. She is unable to trim her own nails. Patient is here for further evaluation and treatment.   Past Medical History:  Diagnosis Date  . Anemia   . Arthritis   . Blood in urine   . Cancer ([place]) 1994   left breast  . Diabetes mellitus   . Fibroid   . GERD (gastroesophageal reflux disease)   . Glaucoma   . Headache(784.0)   . Hyperlipidemia   . Hypertension   . Hyperthyroidism   . Obesity   . Osteoporosis   . Personal history of chemotherapy   . Personal history of radiation therapy   . Pituitary tumor   . PONV (postoperative nausea and vomiting)   . Shortness of breath   . Thyroid disease    overactive thyroid    OBJECTIVE General Patient is awake, alert, and oriented x 3 and in no acute distress. Derm Skin is dry and supple bilateral. Negative open lesions or macerations. Remaining integument unremarkable. Nails are tender, long, thickened and dystrophic with subungual debris, consistent with onychomycosis, 1-5 bilateral. No signs of infection noted. Vasc  DP and PT pedal pulses palpable bilaterally. Temperature gradient within normal limits.  Neuro Epicritic and protective threshold sensation diminished bilaterally.  Musculoskeletal Exam Pain with palpation to the anterior, lateral and medial aspects of the right ankle joint. No symptomatic pedal deformities noted bilateral. Muscular strength within normal limits.  ASSESSMENT 1. 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 Onychomycosis of nail due to dermatophyte bilateral 3. H/o right ankle fracture 4. Right ankle synovitis   PLAN OF CARE 1. Patient evaluated today. 2. Instructed to maintain good pedal hygiene and foot care. Stressed importance of controlling blood sugar.  3. Mechanical debridement of nails 1-5 bilaterally performed  using a nail nipper. Filed with dremel without incident.  4. Injection of 0.5 mLs Celestone Soluspan injected into the right ankle joint.  5. Return to clinic annually.     Edrick Kins, DPM [facility]  Dr. Edrick Kins, [address]                                        [place], [place] [address]                Office [phone]  Fax [phone]

## 2018-12-26 DIAGNOSIS — H401133 Primary open-angle glaucoma, bilateral, severe stage: Secondary | ICD-10-CM | POA: Diagnosis not present

## 2018-12-26 DIAGNOSIS — E119 Type 2 diabetes mellitus without complications: Secondary | ICD-10-CM | POA: Diagnosis not present

## 2018-12-26 DIAGNOSIS — Z794 Long term (current) use of insulin: Secondary | ICD-10-CM | POA: Diagnosis not present

## 2018-12-26 DIAGNOSIS — M810 Age-related osteoporosis without current pathological fracture: Secondary | ICD-10-CM | POA: Diagnosis not present

## 2018-12-26 DIAGNOSIS — E893 Postprocedural hypopituitarism: Secondary | ICD-10-CM | POA: Diagnosis not present

## 2019-01-01 DIAGNOSIS — N183 Chronic kidney disease, stage 3 (moderate): Secondary | ICD-10-CM | POA: Diagnosis not present

## 2019-01-01 DIAGNOSIS — E1122 Type 2 diabetes mellitus with diabetic chronic kidney disease: Secondary | ICD-10-CM | POA: Diagnosis not present

## 2019-01-01 DIAGNOSIS — E785 Hyperlipidemia, unspecified: Secondary | ICD-10-CM | POA: Diagnosis not present

## 2019-01-01 DIAGNOSIS — I129 Hypertensive chronic kidney disease with stage 1 through stage 4 chronic kidney disease, or unspecified chronic kidney disease: Secondary | ICD-10-CM | POA: Diagnosis not present

## 2019-01-01 DIAGNOSIS — E230 Hypopituitarism: Secondary | ICD-10-CM | POA: Diagnosis not present

## 2019-01-27 DIAGNOSIS — K219 Gastro-esophageal reflux disease without esophagitis: Secondary | ICD-10-CM | POA: Diagnosis not present

## 2019-01-27 DIAGNOSIS — J309 Allergic rhinitis, unspecified: Secondary | ICD-10-CM | POA: Diagnosis not present

## 2019-01-27 DIAGNOSIS — E039 Hypothyroidism, unspecified: Secondary | ICD-10-CM | POA: Diagnosis not present

## 2019-01-27 DIAGNOSIS — E1169 Type 2 diabetes mellitus with other specified complication: Secondary | ICD-10-CM | POA: Diagnosis not present

## 2019-01-27 DIAGNOSIS — I10 Essential (primary) hypertension: Secondary | ICD-10-CM | POA: Diagnosis not present

## 2019-02-24 DIAGNOSIS — E039 Hypothyroidism, unspecified: Secondary | ICD-10-CM | POA: Diagnosis not present

## 2019-02-24 DIAGNOSIS — R7301 Impaired fasting glucose: Secondary | ICD-10-CM | POA: Diagnosis not present

## 2019-02-24 DIAGNOSIS — I10 Essential (primary) hypertension: Secondary | ICD-10-CM | POA: Diagnosis not present

## 2019-03-02 DIAGNOSIS — M25561 Pain in right knee: Secondary | ICD-10-CM | POA: Insufficient documentation

## 2019-03-02 DIAGNOSIS — M25562 Pain in left knee: Secondary | ICD-10-CM | POA: Insufficient documentation

## 2019-03-03 DIAGNOSIS — I10 Essential (primary) hypertension: Secondary | ICD-10-CM | POA: Diagnosis not present

## 2019-03-03 DIAGNOSIS — R944 Abnormal results of kidney function studies: Secondary | ICD-10-CM | POA: Diagnosis not present

## 2019-03-03 DIAGNOSIS — K219 Gastro-esophageal reflux disease without esophagitis: Secondary | ICD-10-CM | POA: Diagnosis not present

## 2019-03-03 DIAGNOSIS — R945 Abnormal results of liver function studies: Secondary | ICD-10-CM | POA: Diagnosis not present

## 2019-03-03 DIAGNOSIS — Z0000 Encounter for general adult medical examination without abnormal findings: Secondary | ICD-10-CM | POA: Diagnosis not present

## 2019-03-03 DIAGNOSIS — Z712 Person consulting for explanation of examination or test findings: Secondary | ICD-10-CM | POA: Diagnosis not present

## 2019-03-03 DIAGNOSIS — J309 Allergic rhinitis, unspecified: Secondary | ICD-10-CM | POA: Diagnosis not present

## 2019-03-03 DIAGNOSIS — E039 Hypothyroidism, unspecified: Secondary | ICD-10-CM | POA: Diagnosis not present

## 2019-03-03 DIAGNOSIS — E1169 Type 2 diabetes mellitus with other specified complication: Secondary | ICD-10-CM | POA: Diagnosis not present

## 2019-03-05 DIAGNOSIS — M25562 Pain in left knee: Secondary | ICD-10-CM | POA: Diagnosis not present

## 2019-03-05 DIAGNOSIS — M170 Bilateral primary osteoarthritis of knee: Secondary | ICD-10-CM | POA: Diagnosis not present

## 2019-03-05 DIAGNOSIS — M25561 Pain in right knee: Secondary | ICD-10-CM | POA: Diagnosis not present

## 2019-04-27 DIAGNOSIS — N644 Mastodynia: Secondary | ICD-10-CM | POA: Diagnosis not present

## 2019-04-27 DIAGNOSIS — Z853 Personal history of malignant neoplasm of breast: Secondary | ICD-10-CM | POA: Diagnosis not present

## 2019-04-27 DIAGNOSIS — Z803 Family history of malignant neoplasm of breast: Secondary | ICD-10-CM | POA: Diagnosis not present

## 2019-04-29 ENCOUNTER — Other Ambulatory Visit: Payer: Self-pay | Admitting: Internal Medicine

## 2019-04-29 DIAGNOSIS — N644 Mastodynia: Secondary | ICD-10-CM

## 2019-05-04 ENCOUNTER — Other Ambulatory Visit: Payer: Self-pay | Admitting: Internal Medicine

## 2019-05-06 ENCOUNTER — Other Ambulatory Visit: Payer: Self-pay

## 2019-05-06 ENCOUNTER — Ambulatory Visit: Payer: Medicare HMO

## 2019-05-06 ENCOUNTER — Ambulatory Visit
Admission: RE | Admit: 2019-05-06 | Discharge: 2019-05-06 | Disposition: A | Discharge status: Home / Self Care | Payer: Medicare HMO | Source: Ambulatory Visit | Attending: Internal Medicine | Admitting: Internal Medicine

## 2019-05-06 DIAGNOSIS — N644 Mastodynia: Secondary | ICD-10-CM | POA: Diagnosis not present

## 2019-05-11 ENCOUNTER — Other Ambulatory Visit: Payer: Self-pay | Admitting: Women's Health

## 2019-05-11 DIAGNOSIS — Z1231 Encounter for screening mammogram for malignant neoplasm of breast: Secondary | ICD-10-CM

## 2019-06-17 DIAGNOSIS — D509 Iron deficiency anemia, unspecified: Secondary | ICD-10-CM | POA: Diagnosis not present

## 2019-06-17 DIAGNOSIS — E1169 Type 2 diabetes mellitus with other specified complication: Secondary | ICD-10-CM | POA: Diagnosis not present

## 2019-06-17 DIAGNOSIS — E2839 Other primary ovarian failure: Secondary | ICD-10-CM | POA: Diagnosis not present

## 2019-06-17 DIAGNOSIS — E1165 Type 2 diabetes mellitus with hyperglycemia: Secondary | ICD-10-CM | POA: Diagnosis not present

## 2019-06-17 DIAGNOSIS — E039 Hypothyroidism, unspecified: Secondary | ICD-10-CM | POA: Diagnosis not present

## 2019-06-17 DIAGNOSIS — N183 Chronic kidney disease, stage 3 (moderate): Secondary | ICD-10-CM | POA: Diagnosis not present

## 2019-06-17 DIAGNOSIS — I10 Essential (primary) hypertension: Secondary | ICD-10-CM | POA: Diagnosis not present

## 2019-06-17 DIAGNOSIS — E291 Testicular hypofunction: Secondary | ICD-10-CM | POA: Diagnosis not present

## 2019-06-23 DIAGNOSIS — K219 Gastro-esophageal reflux disease without esophagitis: Secondary | ICD-10-CM | POA: Diagnosis not present

## 2019-06-23 DIAGNOSIS — E039 Hypothyroidism, unspecified: Secondary | ICD-10-CM | POA: Diagnosis not present

## 2019-06-23 DIAGNOSIS — J309 Allergic rhinitis, unspecified: Secondary | ICD-10-CM | POA: Diagnosis not present

## 2019-06-23 DIAGNOSIS — N183 Chronic kidney disease, stage 3 (moderate): Secondary | ICD-10-CM | POA: Diagnosis not present

## 2019-06-23 DIAGNOSIS — I10 Essential (primary) hypertension: Secondary | ICD-10-CM | POA: Diagnosis not present

## 2019-06-23 DIAGNOSIS — R944 Abnormal results of kidney function studies: Secondary | ICD-10-CM | POA: Diagnosis not present

## 2019-06-23 DIAGNOSIS — E782 Mixed hyperlipidemia: Secondary | ICD-10-CM | POA: Diagnosis not present

## 2019-06-23 DIAGNOSIS — E1122 Type 2 diabetes mellitus with diabetic chronic kidney disease: Secondary | ICD-10-CM | POA: Diagnosis not present

## 2019-06-23 DIAGNOSIS — R945 Abnormal results of liver function studies: Secondary | ICD-10-CM | POA: Diagnosis not present

## 2019-06-23 DIAGNOSIS — E1169 Type 2 diabetes mellitus with other specified complication: Secondary | ICD-10-CM | POA: Diagnosis not present

## 2019-06-26 ENCOUNTER — Ambulatory Visit: Payer: Medicare HMO

## 2019-07-08 ENCOUNTER — Encounter: Payer: Self-pay | Admitting: Gynecology

## 2019-07-13 DIAGNOSIS — H04123 Dry eye syndrome of bilateral lacrimal glands: Secondary | ICD-10-CM | POA: Diagnosis not present

## 2019-07-13 DIAGNOSIS — E230 Hypopituitarism: Secondary | ICD-10-CM | POA: Diagnosis not present

## 2019-07-13 DIAGNOSIS — E785 Hyperlipidemia, unspecified: Secondary | ICD-10-CM | POA: Diagnosis not present

## 2019-07-13 DIAGNOSIS — N183 Chronic kidney disease, stage 3 unspecified: Secondary | ICD-10-CM | POA: Diagnosis not present

## 2019-07-13 DIAGNOSIS — H401133 Primary open-angle glaucoma, bilateral, severe stage: Secondary | ICD-10-CM | POA: Diagnosis not present

## 2019-07-13 DIAGNOSIS — I129 Hypertensive chronic kidney disease with stage 1 through stage 4 chronic kidney disease, or unspecified chronic kidney disease: Secondary | ICD-10-CM | POA: Diagnosis not present

## 2019-07-13 DIAGNOSIS — E119 Type 2 diabetes mellitus without complications: Secondary | ICD-10-CM | POA: Diagnosis not present

## 2019-07-13 DIAGNOSIS — E1122 Type 2 diabetes mellitus with diabetic chronic kidney disease: Secondary | ICD-10-CM | POA: Diagnosis not present

## 2019-07-13 DIAGNOSIS — D443 Neoplasm of uncertain behavior of pituitary gland: Secondary | ICD-10-CM | POA: Diagnosis not present

## 2019-07-17 ENCOUNTER — Other Ambulatory Visit: Payer: Self-pay

## 2019-07-20 ENCOUNTER — Encounter: Payer: Self-pay | Admitting: Women's Health

## 2019-07-20 ENCOUNTER — Ambulatory Visit (INDEPENDENT_AMBULATORY_CARE_PROVIDER_SITE_OTHER): Payer: Medicare HMO | Admitting: Women's Health

## 2019-07-20 ENCOUNTER — Other Ambulatory Visit: Payer: Self-pay

## 2019-07-20 DIAGNOSIS — Z01419 Encounter for gynecological examination (general) (routine) without abnormal findings: Secondary | ICD-10-CM | POA: Diagnosis not present

## 2019-07-20 DIAGNOSIS — Z23 Encounter for immunization: Secondary | ICD-10-CM

## 2019-07-20 NOTE — Patient Instructions (Addendum)
Health Maintenance After Age 73 After age 73, you are at a higher risk for certain long-term diseases and infections as well as injuries from falls. Falls are a major cause of broken bones and head injuries in people who are older than age 73. Getting regular preventive care can help to keep you healthy and well. Preventive care includes getting regular testing and making lifestyle changes as recommended by your health care provider. Talk with your health care provider about:  Which screenings and tests you should have. A screening is a test that checks for a disease when you have no symptoms.  A diet and exercise plan that is right for you. What should I know about screenings and tests to prevent falls? Screening and testing are the best ways to find a health problem early. Early diagnosis and treatment give you the best chance of managing medical conditions that are common after age 73. Certain conditions and lifestyle choices may make you more likely to have a fall. Your health care provider may recommend:  Regular vision checks. Poor vision and conditions such as cataracts can make you more likely to have a fall. If you wear glasses, make sure to get your prescription updated if your vision changes.  Medicine review. Work with your health care provider to regularly review all of the medicines you are taking, including over-the-counter medicines. Ask your health care provider about any side effects that may make you more likely to have a fall. Tell your health care provider if any medicines that you take make you feel dizzy or sleepy.  Osteoporosis screening. Osteoporosis is a condition that causes the bones to get weaker. This can make the bones weak and cause them to break more easily.  Blood pressure screening. Blood pressure changes and medicines to control blood pressure can make you feel dizzy.  Strength and balance checks. Your health care provider may recommend certain tests to check your  strength and balance while standing, walking, or changing positions.  Foot health exam. Foot pain and numbness, as well as not wearing proper footwear, can make you more likely to have a fall.  Depression screening. You may be more likely to have a fall if you have a fear of falling, feel emotionally low, or feel unable to do activities that you used to do.  Alcohol use screening. Using too much alcohol can affect your balance and may make you more likely to have a fall. What actions can I take to lower my risk of falls? General instructions  Talk with your health care provider about your risks for falling. Tell your health care provider if: ? You fall. Be sure to tell your health care provider about all falls, even ones that seem minor. ? You feel dizzy, sleepy, or off-balance.  Take over-the-counter and prescription medicines only as told by your health care provider. These include any supplements.  Eat a healthy diet and maintain a healthy weight. A healthy diet includes low-fat dairy products, low-fat (lean) meats, and fiber from whole grains, beans, and lots of fruits and vegetables. Home safety  Remove any tripping hazards, such as rugs, cords, and clutter.  Install safety equipment such as grab bars in bathrooms and safety rails on stairs.  Keep rooms and walkways well-lit. Activity   Follow a regular exercise program to stay fit. This will help you maintain your balance. Ask your health care provider what types of exercise are appropriate for you.  If you need a cane or   walker, use it as recommended by your health care provider.  Wear supportive shoes that have nonskid soles. Lifestyle  Do not drink alcohol if your health care provider tells you not to drink.  If you drink alcohol, limit how much you have: ? 0-1 drink a day for women. ? 0-2 drinks a day for men.  Be aware of how much alcohol is in your drink. In the [place] one drink equals one typical bottle of beer (12  oz), one-half glass of wine (5 oz), or one shot of hard liquor (1 oz).  Do not use any products that contain nicotine or tobacco, such as cigarettes and e-cigarettes. If you need help quitting, ask your health care provider. Summary  Having a healthy lifestyle and getting preventive care can help to protect your health and wellness after age 53.  Screening and testing are the best way to find a health problem early and help you avoid having a fall. Early diagnosis and treatment give you the best chance for managing medical conditions that are more common for people who are older than age 51.  Falls are a major cause of broken bones and head injuries in people who are older than age 73. Take precautions to prevent a fall at home.  Work with your health care provider to learn what changes you can make to improve your health and wellness and to prevent falls. This information is not intended to replace advice given to you by your health care provider. Make sure you discuss any questions you have with your health care provider. Document Released: 07/31/2017 Document Revised: 01/08/2019 Document Reviewed: 07/31/2017 Elsevier Patient Education  2020 [employer] for Diabetes Mellitus, Adult  Carbohydrate counting is a method of keeping track of how many carbohydrates you eat. Eating carbohydrates naturally increases the amount of sugar (glucose) in the blood. Counting how many carbohydrates you eat helps keep your blood glucose within normal limits, which helps you manage your diabetes (diabetes mellitus). It is important to know how many carbohydrates you can safely have in each meal. This is different for every person. A diet and nutrition specialist (registered dietitian) can help you make a meal plan and calculate how many carbohydrates you should have at each meal and snack. Carbohydrates are found in the following foods:  Grains, such as breads and cereals.  Dried  beans and soy products.  Starchy vegetables, such as potatoes, peas, and corn.  Fruit and fruit juices.  Milk and yogurt.  Sweets and snack foods, such as cake, cookies, candy, chips, and soft drinks. How do I count carbohydrates? There are two ways to count carbohydrates in food. You can use either of the methods or a combination of both. Reading "Nutrition Facts" on packaged food The "Nutrition Facts" list is included on the labels of almost all packaged foods and beverages in the [place] It includes:  The serving size.  Information about nutrients in each serving, including the grams (g) of carbohydrate per serving. To use the "Nutrition Facts":  Decide how many servings you will have.  Multiply the number of servings by the number of carbohydrates per serving.  The resulting number is the total amount of carbohydrates that you will be having. Learning standard serving sizes of other foods When you eat carbohydrate foods that are not packaged or do not include "Nutrition Facts" on the label, you need to measure the servings in order to count the amount of carbohydrates:  Measure the foods  that you will eat with a food scale or measuring cup, if needed.  Decide how many standard-size servings you will eat.  Multiply the number of servings by 15. Most carbohydrate-rich foods have about 15 g of carbohydrates per serving. ? For example, if you eat 8 oz (170 g) of strawberries, you will have eaten 2 servings and 30 g of carbohydrates (2 servings x 15 g = 30 g).  For foods that have more than one food mixed, such as soups and casseroles, you must count the carbohydrates in each food that is included. The following list contains standard serving sizes of common carbohydrate-rich foods. Each of these servings has about 15 g of carbohydrates:   hamburger bun or  English muffin.   oz (15 mL) syrup.   oz (14 g) jelly.  1 slice of bread.  1 six-inch tortilla.  3 oz (85 g)  cooked rice or pasta.  4 oz (113 g) cooked dried beans.  4 oz (113 g) starchy vegetable, such as peas, corn, or potatoes.  4 oz (113 g) hot cereal.  4 oz (113 g) mashed potatoes or  of a large baked potato.  4 oz (113 g) canned or frozen fruit.  4 oz (120 mL) fruit juice.  4-6 crackers.  6 chicken nuggets.  6 oz (170 g) unsweetened dry cereal.  6 oz (170 g) plain fat-free yogurt or yogurt sweetened with artificial sweeteners.  8 oz (240 mL) milk.  8 oz (170 g) fresh fruit or one small piece of fruit.  24 oz (680 g) popped popcorn. Example of carbohydrate counting Sample meal  3 oz (85 g) chicken breast.  6 oz (170 g) brown rice.  4 oz (113 g) corn.  8 oz (240 mL) milk.  8 oz (170 g) strawberries with sugar-free whipped topping. Carbohydrate calculation 1. Identify the foods that contain carbohydrates: ? Rice. ? Corn. ? Milk. ? Strawberries. 2. Calculate how many servings you have of each food: ? 2 servings rice. ? 1 serving corn. ? 1 serving milk. ? 1 serving strawberries. 3. Multiply each number of servings by 15 g: ? 2 servings rice x 15 g = 30 g. ? 1 serving corn x 15 g = 15 g. ? 1 serving milk x 15 g = 15 g. ? 1 serving strawberries x 15 g = 15 g. 4. Add together all of the amounts to find the total grams of carbohydrates eaten: ? 30 g + 15 g + 15 g + 15 g = 75 g of carbohydrates total. Summary  Carbohydrate counting is a method of keeping track of how many carbohydrates you eat.  Eating carbohydrates naturally increases the amount of sugar (glucose) in the blood.  Counting how many carbohydrates you eat helps keep your blood glucose within normal limits, which helps you manage your diabetes.  A diet and nutrition specialist (registered dietitian) can help you make a meal plan and calculate how many carbohydrates you should have at each meal and snack. This information is not intended to replace advice given to you by your health care provider.  Make sure you discuss any questions you have with your health care provider. Document Released: 09/17/2005 Document Revised: 04/11/2017 Document Reviewed: 02/29/2016 Elsevier Patient Education  2020 [employer].

## 2019-07-20 NOTE — Progress Notes (Signed)
Alexis Singh [birth year] [id number]    History:    Presents for breast and pelvic exam.  1990 TAH for fibroids on no HRT.  1994 left breast cancer lumpectomy, radiation, chemotherapy.  Normal Pap history.  2011 - colonoscopy.  DEXA endocrinologist reports as osteoporosis was not able to tolerate Fosamax and Prolia too expensive.  Medical problems include hypothyroidism, diabetes, pituitary tumor and hypertension.  Has had pneumonia vaccine but not Shingrix due to cost.    Past medical history, past surgical history, family history and social history were all reviewed and documented in the EPIC chart.  1 son and 3 stepchildren.  Husband healthy.  ROS:  A ROS was performed and pertinent positives and negatives are included.  Exam:  Vitals:   07/20/19 1018  BP: 118/80  Weight: 197 lb (89.4 kg)  Height: 5\' 4"  (1.626 m)   Body mass index is 33.81 kg/m.   General appearance:  Normal Thyroid:  Symmetrical, normal in size, without palpable masses or nodularity. Respiratory  Auscultation:  Clear without wheezing or rhonchi Cardiovascular  Auscultation:  Regular rate, without rubs, murmurs or gallops  Edema/varicosities:  Not grossly evident Abdominal  Soft,nontender, without masses, guarding or rebound.  Liver/spleen:  No organomegaly noted  Hernia:  None appreciated  Skin  Inspection:  Grossly normal   Breasts: Examined lying and sitting.     Right: Without masses, retractions, discharge or axillary adenopathy.     Left: Well-healed incision from left axilla area ,without masses, retractions, discharge or axillary adenopathy. Gentitourinary   Inguinal/mons:  Normal without inguinal adenopathy  External genitalia:  Normal  BUS/Urethra/Skene's glands:  Normal  Vagina:  Normal  Cervix: And uterus absent  Adnexa/parametria:     Rt: Without masses or tenderness.   Lt: Without masses or tenderness.  Anus and perineum: Normal  Digital rectal exam: Normal sphincter tone without  palpated masses or tenderness  Assessment/Plan:  73 y.o. MBF G2, P1 for breast and pelvic exam with no complaints.  1990 TAH for fibroids on no HRT 1994 left breast cancer lumpectomy, radiation, chemotherapy normal mammogram since Diabetes on insulin, osteoporosis, hypothyroidism -endocrinologist manages Hypertension,  hypercholesteremia-primary care manages labs and meds  Plan: Shingrex reviewed and encouraged.  SBEs, continue annual screening mammogram, calcium rich foods, vitamin D 2000 daily encouraged.  Reviewed importance of exercise weightbearing as well as balance, yoga encouraged.  Fall prevention, home safety discussed.   Huel Cote [facility], 10:47 AM 07/20/2019

## 2019-07-24 DIAGNOSIS — Z6834 Body mass index (BMI) 34.0-34.9, adult: Secondary | ICD-10-CM | POA: Diagnosis not present

## 2019-07-24 DIAGNOSIS — E782 Mixed hyperlipidemia: Secondary | ICD-10-CM | POA: Diagnosis not present

## 2019-07-24 DIAGNOSIS — E162 Hypoglycemia, unspecified: Secondary | ICD-10-CM | POA: Diagnosis not present

## 2019-07-24 DIAGNOSIS — E1169 Type 2 diabetes mellitus with other specified complication: Secondary | ICD-10-CM | POA: Diagnosis not present

## 2019-07-24 DIAGNOSIS — E6609 Other obesity due to excess calories: Secondary | ICD-10-CM | POA: Diagnosis not present

## 2019-07-24 DIAGNOSIS — E11649 Type 2 diabetes mellitus with hypoglycemia without coma: Secondary | ICD-10-CM | POA: Diagnosis not present

## 2019-07-24 DIAGNOSIS — I10 Essential (primary) hypertension: Secondary | ICD-10-CM | POA: Diagnosis not present

## 2019-08-12 ENCOUNTER — Other Ambulatory Visit: Payer: Self-pay

## 2019-08-12 ENCOUNTER — Ambulatory Visit
Admission: RE | Admit: 2019-08-12 | Discharge: 2019-08-12 | Disposition: A | Discharge status: Home / Self Care | Payer: Medicare HMO | Source: Ambulatory Visit | Attending: Women's Health | Admitting: Women's Health

## 2019-08-12 DIAGNOSIS — Z1231 Encounter for screening mammogram for malignant neoplasm of breast: Secondary | ICD-10-CM

## 2019-09-29 DIAGNOSIS — I10 Essential (primary) hypertension: Secondary | ICD-10-CM | POA: Diagnosis not present

## 2019-09-29 DIAGNOSIS — E782 Mixed hyperlipidemia: Secondary | ICD-10-CM | POA: Diagnosis not present

## 2019-09-29 DIAGNOSIS — E1165 Type 2 diabetes mellitus with hyperglycemia: Secondary | ICD-10-CM | POA: Diagnosis not present

## 2019-09-29 DIAGNOSIS — E1169 Type 2 diabetes mellitus with other specified complication: Secondary | ICD-10-CM | POA: Diagnosis not present

## 2019-09-29 DIAGNOSIS — D509 Iron deficiency anemia, unspecified: Secondary | ICD-10-CM | POA: Diagnosis not present

## 2019-09-29 DIAGNOSIS — E039 Hypothyroidism, unspecified: Secondary | ICD-10-CM | POA: Diagnosis not present

## 2019-09-30 DIAGNOSIS — I129 Hypertensive chronic kidney disease with stage 1 through stage 4 chronic kidney disease, or unspecified chronic kidney disease: Secondary | ICD-10-CM | POA: Diagnosis not present

## 2019-09-30 DIAGNOSIS — N1832 Chronic kidney disease, stage 3b: Secondary | ICD-10-CM | POA: Diagnosis not present

## 2019-09-30 DIAGNOSIS — E039 Hypothyroidism, unspecified: Secondary | ICD-10-CM | POA: Diagnosis not present

## 2019-09-30 DIAGNOSIS — J309 Allergic rhinitis, unspecified: Secondary | ICD-10-CM | POA: Diagnosis not present

## 2019-09-30 DIAGNOSIS — D631 Anemia in chronic kidney disease: Secondary | ICD-10-CM | POA: Diagnosis not present

## 2019-09-30 DIAGNOSIS — E1122 Type 2 diabetes mellitus with diabetic chronic kidney disease: Secondary | ICD-10-CM | POA: Diagnosis not present

## 2019-09-30 DIAGNOSIS — E782 Mixed hyperlipidemia: Secondary | ICD-10-CM | POA: Diagnosis not present

## 2019-09-30 DIAGNOSIS — K219 Gastro-esophageal reflux disease without esophagitis: Secondary | ICD-10-CM | POA: Diagnosis not present

## 2019-09-30 DIAGNOSIS — R945 Abnormal results of liver function studies: Secondary | ICD-10-CM | POA: Diagnosis not present

## 2019-10-14 DIAGNOSIS — N1832 Chronic kidney disease, stage 3b: Secondary | ICD-10-CM | POA: Diagnosis not present

## 2019-10-14 DIAGNOSIS — E162 Hypoglycemia, unspecified: Secondary | ICD-10-CM | POA: Diagnosis not present

## 2019-10-14 DIAGNOSIS — D509 Iron deficiency anemia, unspecified: Secondary | ICD-10-CM | POA: Diagnosis not present

## 2019-10-14 DIAGNOSIS — N183 Chronic kidney disease, stage 3 unspecified: Secondary | ICD-10-CM | POA: Diagnosis not present

## 2019-10-14 DIAGNOSIS — Z0001 Encounter for general adult medical examination with abnormal findings: Secondary | ICD-10-CM | POA: Diagnosis not present

## 2019-10-14 DIAGNOSIS — I129 Hypertensive chronic kidney disease with stage 1 through stage 4 chronic kidney disease, or unspecified chronic kidney disease: Secondary | ICD-10-CM | POA: Diagnosis not present

## 2019-10-14 DIAGNOSIS — Z6832 Body mass index (BMI) 32.0-32.9, adult: Secondary | ICD-10-CM | POA: Diagnosis not present

## 2019-10-14 DIAGNOSIS — R945 Abnormal results of liver function studies: Secondary | ICD-10-CM | POA: Diagnosis not present

## 2019-10-14 DIAGNOSIS — R944 Abnormal results of kidney function studies: Secondary | ICD-10-CM | POA: Diagnosis not present

## 2019-10-14 DIAGNOSIS — Z0000 Encounter for general adult medical examination without abnormal findings: Secondary | ICD-10-CM | POA: Diagnosis not present

## 2019-10-14 DIAGNOSIS — E1122 Type 2 diabetes mellitus with diabetic chronic kidney disease: Secondary | ICD-10-CM | POA: Diagnosis not present

## 2019-11-30 DIAGNOSIS — H401133 Primary open-angle glaucoma, bilateral, severe stage: Secondary | ICD-10-CM | POA: Diagnosis not present

## 2019-12-21 ENCOUNTER — Other Ambulatory Visit (HOSPITAL_COMMUNITY): Payer: Self-pay | Admitting: Internal Medicine

## 2019-12-21 DIAGNOSIS — Z794 Long term (current) use of insulin: Secondary | ICD-10-CM | POA: Diagnosis not present

## 2019-12-21 DIAGNOSIS — M810 Age-related osteoporosis without current pathological fracture: Secondary | ICD-10-CM | POA: Diagnosis not present

## 2019-12-21 DIAGNOSIS — E893 Postprocedural hypopituitarism: Secondary | ICD-10-CM | POA: Diagnosis not present

## 2019-12-21 DIAGNOSIS — E119 Type 2 diabetes mellitus without complications: Secondary | ICD-10-CM | POA: Diagnosis not present

## 2020-01-11 ENCOUNTER — Other Ambulatory Visit: Payer: Self-pay

## 2020-01-11 ENCOUNTER — Ambulatory Visit (HOSPITAL_COMMUNITY)
Admission: RE | Admit: 2020-01-11 | Discharge: 2020-01-11 | Disposition: A | Discharge status: Home / Self Care | Payer: Medicare HMO | Source: Ambulatory Visit | Attending: Internal Medicine | Admitting: Internal Medicine

## 2020-01-11 DIAGNOSIS — Z780 Asymptomatic menopausal state: Secondary | ICD-10-CM | POA: Diagnosis not present

## 2020-01-11 DIAGNOSIS — M810 Age-related osteoporosis without current pathological fracture: Secondary | ICD-10-CM | POA: Insufficient documentation

## 2020-01-11 DIAGNOSIS — M85852 Other specified disorders of bone density and structure, left thigh: Secondary | ICD-10-CM | POA: Diagnosis not present

## 2020-02-11 DIAGNOSIS — L668 Other cicatricial alopecia: Secondary | ICD-10-CM | POA: Diagnosis not present

## 2020-02-11 DIAGNOSIS — L648 Other androgenic alopecia: Secondary | ICD-10-CM | POA: Diagnosis not present

## 2020-02-22 DIAGNOSIS — M25531 Pain in right wrist: Secondary | ICD-10-CM | POA: Diagnosis not present

## 2020-02-22 DIAGNOSIS — M79644 Pain in right finger(s): Secondary | ICD-10-CM | POA: Diagnosis not present

## 2020-03-25 DIAGNOSIS — L668 Other cicatricial alopecia: Secondary | ICD-10-CM | POA: Diagnosis not present

## 2020-04-11 DIAGNOSIS — H401133 Primary open-angle glaucoma, bilateral, severe stage: Secondary | ICD-10-CM | POA: Diagnosis not present

## 2020-04-13 DIAGNOSIS — E162 Hypoglycemia, unspecified: Secondary | ICD-10-CM | POA: Diagnosis not present

## 2020-04-13 DIAGNOSIS — Z712 Person consulting for explanation of examination or test findings: Secondary | ICD-10-CM | POA: Diagnosis not present

## 2020-04-13 DIAGNOSIS — Z0001 Encounter for general adult medical examination with abnormal findings: Secondary | ICD-10-CM | POA: Diagnosis not present

## 2020-04-13 DIAGNOSIS — D509 Iron deficiency anemia, unspecified: Secondary | ICD-10-CM | POA: Diagnosis not present

## 2020-04-13 DIAGNOSIS — Z0000 Encounter for general adult medical examination without abnormal findings: Secondary | ICD-10-CM | POA: Diagnosis not present

## 2020-04-13 DIAGNOSIS — R944 Abnormal results of kidney function studies: Secondary | ICD-10-CM | POA: Diagnosis not present

## 2020-04-13 DIAGNOSIS — R945 Abnormal results of liver function studies: Secondary | ICD-10-CM | POA: Diagnosis not present

## 2020-04-13 DIAGNOSIS — E1122 Type 2 diabetes mellitus with diabetic chronic kidney disease: Secondary | ICD-10-CM | POA: Diagnosis not present

## 2020-04-13 DIAGNOSIS — Z6832 Body mass index (BMI) 32.0-32.9, adult: Secondary | ICD-10-CM | POA: Diagnosis not present

## 2020-04-18 DIAGNOSIS — J309 Allergic rhinitis, unspecified: Secondary | ICD-10-CM | POA: Diagnosis not present

## 2020-04-18 DIAGNOSIS — K219 Gastro-esophageal reflux disease without esophagitis: Secondary | ICD-10-CM | POA: Diagnosis not present

## 2020-04-18 DIAGNOSIS — E782 Mixed hyperlipidemia: Secondary | ICD-10-CM | POA: Diagnosis not present

## 2020-04-18 DIAGNOSIS — I129 Hypertensive chronic kidney disease with stage 1 through stage 4 chronic kidney disease, or unspecified chronic kidney disease: Secondary | ICD-10-CM | POA: Diagnosis not present

## 2020-04-18 DIAGNOSIS — E1122 Type 2 diabetes mellitus with diabetic chronic kidney disease: Secondary | ICD-10-CM | POA: Diagnosis not present

## 2020-04-18 DIAGNOSIS — Z0001 Encounter for general adult medical examination with abnormal findings: Secondary | ICD-10-CM | POA: Diagnosis not present

## 2020-04-18 DIAGNOSIS — N1832 Chronic kidney disease, stage 3b: Secondary | ICD-10-CM | POA: Diagnosis not present

## 2020-04-18 DIAGNOSIS — R945 Abnormal results of liver function studies: Secondary | ICD-10-CM | POA: Diagnosis not present

## 2020-04-18 DIAGNOSIS — E039 Hypothyroidism, unspecified: Secondary | ICD-10-CM | POA: Diagnosis not present

## 2020-06-13 DIAGNOSIS — E1122 Type 2 diabetes mellitus with diabetic chronic kidney disease: Secondary | ICD-10-CM | POA: Diagnosis not present

## 2020-06-13 DIAGNOSIS — I129 Hypertensive chronic kidney disease with stage 1 through stage 4 chronic kidney disease, or unspecified chronic kidney disease: Secondary | ICD-10-CM | POA: Diagnosis not present

## 2020-06-13 DIAGNOSIS — N183 Chronic kidney disease, stage 3 unspecified: Secondary | ICD-10-CM | POA: Diagnosis not present

## 2020-06-13 DIAGNOSIS — E039 Hypothyroidism, unspecified: Secondary | ICD-10-CM | POA: Diagnosis not present

## 2020-06-15 ENCOUNTER — Other Ambulatory Visit: Payer: Self-pay | Admitting: Women's Health

## 2020-06-15 ENCOUNTER — Other Ambulatory Visit: Payer: Self-pay | Admitting: Nurse Practitioner

## 2020-06-15 DIAGNOSIS — Z1231 Encounter for screening mammogram for malignant neoplasm of breast: Secondary | ICD-10-CM

## 2020-07-04 DIAGNOSIS — M25571 Pain in right ankle and joints of right foot: Secondary | ICD-10-CM | POA: Diagnosis not present

## 2020-07-04 DIAGNOSIS — M19171 Post-traumatic osteoarthritis, right ankle and foot: Secondary | ICD-10-CM | POA: Diagnosis not present

## 2020-07-21 ENCOUNTER — Other Ambulatory Visit: Payer: Self-pay

## 2020-07-21 ENCOUNTER — Encounter: Payer: Self-pay | Admitting: Nurse Practitioner

## 2020-07-21 ENCOUNTER — Ambulatory Visit: Payer: Medicare HMO | Admitting: Nurse Practitioner

## 2020-07-21 VITALS — BP 122/80 | Ht 64.0 in | Wt 184.0 lb

## 2020-07-21 DIAGNOSIS — Z853 Personal history of malignant neoplasm of breast: Secondary | ICD-10-CM

## 2020-07-21 DIAGNOSIS — Z01419 Encounter for gynecological examination (general) (routine) without abnormal findings: Secondary | ICD-10-CM

## 2020-07-21 DIAGNOSIS — Z23 Encounter for immunization: Secondary | ICD-10-CM | POA: Diagnosis not present

## 2020-07-21 DIAGNOSIS — Z90710 Acquired absence of both cervix and uterus: Secondary | ICD-10-CM

## 2020-07-21 DIAGNOSIS — M8589 Other specified disorders of bone density and structure, multiple sites: Secondary | ICD-10-CM

## 2020-07-21 NOTE — Patient Instructions (Signed)
Health Maintenance After Age 74 After age 74, you are at a higher risk for certain long-term diseases and infections as well as injuries from falls. Falls are a major cause of broken bones and head injuries in people who are older than age 74. Getting regular preventive care can help to keep you healthy and well. Preventive care includes getting regular testing and making lifestyle changes as recommended by your health care provider. Talk with your health care provider about:  Which screenings and tests you should have. A screening is a test that checks for a disease when you have no symptoms.  A diet and exercise plan that is right for you. What should I know about screenings and tests to prevent falls? Screening and testing are the best ways to find a health problem early. Early diagnosis and treatment give you the best chance of managing medical conditions that are common after age 74. Certain conditions and lifestyle choices may make you more likely to have a fall. Your health care provider may recommend:  Regular vision checks. Poor vision and conditions such as cataracts can make you more likely to have a fall. If you wear glasses, make sure to get your prescription updated if your vision changes.  Medicine review. Work with your health care provider to regularly review all of the medicines you are taking, including over-the-counter medicines. Ask your health care provider about any side effects that may make you more likely to have a fall. Tell your health care provider if any medicines that you take make you feel dizzy or sleepy.  Osteoporosis screening. Osteoporosis is a condition that causes the bones to get weaker. This can make the bones weak and cause them to break more easily.  Blood pressure screening. Blood pressure changes and medicines to control blood pressure can make you feel dizzy.  Strength and balance checks. Your health care provider may recommend certain tests to check your  strength and balance while standing, walking, or changing positions.  Foot health exam. Foot pain and numbness, as well as not wearing proper footwear, can make you more likely to have a fall.  Depression screening. You may be more likely to have a fall if you have a fear of falling, feel emotionally low, or feel unable to do activities that you used to do.  Alcohol use screening. Using too much alcohol can affect your balance and may make you more likely to have a fall. What actions can I take to lower my risk of falls? General instructions  Talk with your health care provider about your risks for falling. Tell your health care provider if: ? You fall. Be sure to tell your health care provider about all falls, even ones that seem minor. ? You feel dizzy, sleepy, or off-balance.  Take over-the-counter and prescription medicines only as told by your health care provider. These include any supplements.  Eat a healthy diet and maintain a healthy weight. A healthy diet includes low-fat dairy products, low-fat (lean) meats, and fiber from whole grains, beans, and lots of fruits and vegetables. Home safety  Remove any tripping hazards, such as rugs, cords, and clutter.  Install safety equipment such as grab bars in bathrooms and safety rails on stairs.  Keep rooms and walkways well-lit. Activity   Follow a regular exercise program to stay fit. This will help you maintain your balance. Ask your health care provider what types of exercise are appropriate for you.  If you need a cane or   walker, use it as recommended by your health care provider.  Wear supportive shoes that have nonskid soles. Lifestyle  Do not drink alcohol if your health care provider tells you not to drink.  If you drink alcohol, limit how much you have: ? 0-1 drink a day for women. ? 0-2 drinks a day for men.  Be aware of how much alcohol is in your drink. In the [place] one drink equals one typical bottle of beer (12  oz), one-half glass of wine (5 oz), or one shot of hard liquor (1 oz).  Do not use any products that contain nicotine or tobacco, such as cigarettes and e-cigarettes. If you need help quitting, ask your health care provider. Summary  Having a healthy lifestyle and getting preventive care can help to protect your health and wellness after age 74.  Screening and testing are the best way to find a health problem early and help you avoid having a fall. Early diagnosis and treatment give you the best chance for managing medical conditions that are more common for people who are older than age 74.  Falls are a major cause of broken bones and head injuries in people who are older than age 74. Take precautions to prevent a fall at home.  Work with your health care provider to learn what changes you can make to improve your health and wellness and to prevent falls. This information is not intended to replace advice given to you by your health care provider. Make sure you discuss any questions you have with your health care provider. Document Revised: 01/08/2019 Document Reviewed: 07/31/2017 Elsevier Patient Education  2020 [employer]  

## 2020-07-21 NOTE — Progress Notes (Signed)
   Alexis Singh 01-19-1946 [id number]   History:  74 y.o. G2P1011 presents for breast and pelvic exam without GYN complaints. 1990 TAH for fibroids, no HRT. Normal pap history. 1994 left breast cancer treated with lumpectomy, radiation, and chemotherapy, subsequent mammograms normal. Osteoporosis managed by endocrinology, did not tolerate fosamax and Prolia was too expensive. History of hypothyroidism and DM.   Gynecologic History No LMP recorded. Patient has had a hysterectomy.   Contraception: status post hysterectomy Last Pap: No longer screening per guidelines Last mammogram: 08/12/2019. Results were: normal Last colonoscopy: 06/2010. Results were: hemorrhoids, 10 year follow up recommended Last Dexa: 01/11/2020. Results were: t-score -1.5, FRAX 7.2% / 1.2%  Past medical history, past surgical history, family history and social history were all reviewed and documented in the EPIC chart.  ROS:  A ROS was performed and pertinent positives and negatives are included.  Exam:  Vitals:   74/21/21 1014  BP: 122/80  Weight: 184 lb (83.5 kg)  Height: 5\' 4"  (1.626 m)   Body mass index is 31.58 kg/m.  General appearance:  Normal Thyroid:  Symmetrical, normal in size, without palpable masses or nodularity. Respiratory  Auscultation:  Clear without wheezing or rhonchi Cardiovascular  Auscultation:  Regular rate, without rubs, murmurs or gallops  Edema/varicosities:  Not grossly evident Abdominal  Soft,nontender, without masses, guarding or rebound.  Liver/spleen:  No organomegaly noted  Hernia:  None appreciated  Skin  Inspection:  Grossly normal   Breasts: Examined lying and sitting.   Right: Without masses, retractions, discharge or axillary adenopathy.   Left: Without masses, retractions, discharge or axillary adenopathy. Gentitourinary   Inguinal/mons:  Normal without inguinal adenopathy  External genitalia:  Normal  BUS/Urethra/Skene's glands:  Normal  Vagina:   Atrophic changes  Cervix:  Absent  Uterus:  Absent  Adnexa/parametria:     Rt: Without masses or tenderness.   Lt: Without masses or tenderness.  Anus and perineum: Normal  Digital rectal exam: Normal sphincter tone without palpated masses or tenderness  Assessment/Plan:  74 y.o. G2P1011 for annual exam.   Well female exam with routine gynecological exam - Education provided on SBEs, importance of preventative screenings, current guidelines, high calcium diet, regular exercise, and multivitamin daily. Labs with PCP and endocrinology.   Need for immunization against influenza - Plan: Flu Vaccine QUAD High Dose(Fluad)  Osteopenia of multiple sites -taking 400 IUs daily. Exercises as tolerated, had a recent foot injury. Endocrinology manages. Did not tolerate Fosamax and Prolia was too expensive.  History of total abdominal hysterectomy - 1990  Personal history of breast cancer - 1994 Left. Treated with lumpectomy, radiation, and chemotherapy.  Screening for cervical cancer -normal Pap history. No longer screening per guidelines.  Screening for breast cancer -normal breast exam today. Left breast cancer in 1994, subsequent mammograms normal.  Screening for colon cancer -due for screening colonoscopy. She plans to schedule the soon.  Follow-up in 2 years     Tamela Gammon [facility], 10:29 AM 07/21/2020

## 2020-07-22 DIAGNOSIS — H401133 Primary open-angle glaucoma, bilateral, severe stage: Secondary | ICD-10-CM | POA: Diagnosis not present

## 2020-07-22 DIAGNOSIS — H2513 Age-related nuclear cataract, bilateral: Secondary | ICD-10-CM | POA: Diagnosis not present

## 2020-07-22 DIAGNOSIS — E119 Type 2 diabetes mellitus without complications: Secondary | ICD-10-CM | POA: Diagnosis not present

## 2020-07-22 DIAGNOSIS — H04123 Dry eye syndrome of bilateral lacrimal glands: Secondary | ICD-10-CM | POA: Diagnosis not present

## 2020-07-29 DIAGNOSIS — I129 Hypertensive chronic kidney disease with stage 1 through stage 4 chronic kidney disease, or unspecified chronic kidney disease: Secondary | ICD-10-CM | POA: Diagnosis not present

## 2020-07-29 DIAGNOSIS — E039 Hypothyroidism, unspecified: Secondary | ICD-10-CM | POA: Diagnosis not present

## 2020-07-29 DIAGNOSIS — N183 Chronic kidney disease, stage 3 unspecified: Secondary | ICD-10-CM | POA: Diagnosis not present

## 2020-07-29 DIAGNOSIS — E1122 Type 2 diabetes mellitus with diabetic chronic kidney disease: Secondary | ICD-10-CM | POA: Diagnosis not present

## 2020-08-12 ENCOUNTER — Other Ambulatory Visit: Payer: Self-pay

## 2020-08-12 ENCOUNTER — Ambulatory Visit
Admission: RE | Admit: 2020-08-12 | Discharge: 2020-08-12 | Disposition: A | Discharge status: Home / Self Care | Payer: Medicare HMO | Source: Ambulatory Visit | Attending: Nurse Practitioner | Admitting: Nurse Practitioner

## 2020-08-12 DIAGNOSIS — Z1231 Encounter for screening mammogram for malignant neoplasm of breast: Secondary | ICD-10-CM | POA: Diagnosis not present

## 2020-08-15 DIAGNOSIS — H401133 Primary open-angle glaucoma, bilateral, severe stage: Secondary | ICD-10-CM | POA: Diagnosis not present

## 2020-08-19 DIAGNOSIS — I129 Hypertensive chronic kidney disease with stage 1 through stage 4 chronic kidney disease, or unspecified chronic kidney disease: Secondary | ICD-10-CM | POA: Diagnosis not present

## 2020-08-19 DIAGNOSIS — E1122 Type 2 diabetes mellitus with diabetic chronic kidney disease: Secondary | ICD-10-CM | POA: Diagnosis not present

## 2020-08-19 DIAGNOSIS — N183 Chronic kidney disease, stage 3 unspecified: Secondary | ICD-10-CM | POA: Diagnosis not present

## 2020-08-19 DIAGNOSIS — E039 Hypothyroidism, unspecified: Secondary | ICD-10-CM | POA: Diagnosis not present

## 2020-08-22 DIAGNOSIS — E1122 Type 2 diabetes mellitus with diabetic chronic kidney disease: Secondary | ICD-10-CM | POA: Diagnosis not present

## 2020-08-22 DIAGNOSIS — Z0001 Encounter for general adult medical examination with abnormal findings: Secondary | ICD-10-CM | POA: Diagnosis not present

## 2020-08-22 DIAGNOSIS — N183 Chronic kidney disease, stage 3 unspecified: Secondary | ICD-10-CM | POA: Diagnosis not present

## 2020-08-22 DIAGNOSIS — D509 Iron deficiency anemia, unspecified: Secondary | ICD-10-CM | POA: Diagnosis not present

## 2020-08-22 DIAGNOSIS — E162 Hypoglycemia, unspecified: Secondary | ICD-10-CM | POA: Diagnosis not present

## 2020-08-22 DIAGNOSIS — R945 Abnormal results of liver function studies: Secondary | ICD-10-CM | POA: Diagnosis not present

## 2020-08-22 DIAGNOSIS — N1832 Chronic kidney disease, stage 3b: Secondary | ICD-10-CM | POA: Diagnosis not present

## 2020-08-22 DIAGNOSIS — R944 Abnormal results of kidney function studies: Secondary | ICD-10-CM | POA: Diagnosis not present

## 2020-08-22 DIAGNOSIS — Z0000 Encounter for general adult medical examination without abnormal findings: Secondary | ICD-10-CM | POA: Diagnosis not present

## 2020-08-22 DIAGNOSIS — Z6832 Body mass index (BMI) 32.0-32.9, adult: Secondary | ICD-10-CM | POA: Diagnosis not present

## 2020-08-22 DIAGNOSIS — Z712 Person consulting for explanation of examination or test findings: Secondary | ICD-10-CM | POA: Diagnosis not present

## 2020-08-29 DIAGNOSIS — N1832 Chronic kidney disease, stage 3b: Secondary | ICD-10-CM | POA: Diagnosis not present

## 2020-08-29 DIAGNOSIS — E1122 Type 2 diabetes mellitus with diabetic chronic kidney disease: Secondary | ICD-10-CM | POA: Diagnosis not present

## 2020-08-29 DIAGNOSIS — E039 Hypothyroidism, unspecified: Secondary | ICD-10-CM | POA: Diagnosis not present

## 2020-08-29 DIAGNOSIS — J309 Allergic rhinitis, unspecified: Secondary | ICD-10-CM | POA: Diagnosis not present

## 2020-08-29 DIAGNOSIS — D631 Anemia in chronic kidney disease: Secondary | ICD-10-CM | POA: Diagnosis not present

## 2020-08-29 DIAGNOSIS — K219 Gastro-esophageal reflux disease without esophagitis: Secondary | ICD-10-CM | POA: Diagnosis not present

## 2020-08-29 DIAGNOSIS — I129 Hypertensive chronic kidney disease with stage 1 through stage 4 chronic kidney disease, or unspecified chronic kidney disease: Secondary | ICD-10-CM | POA: Diagnosis not present

## 2020-08-29 DIAGNOSIS — E782 Mixed hyperlipidemia: Secondary | ICD-10-CM | POA: Diagnosis not present

## 2020-08-29 DIAGNOSIS — R945 Abnormal results of liver function studies: Secondary | ICD-10-CM | POA: Diagnosis not present

## 2020-08-31 DIAGNOSIS — E785 Hyperlipidemia, unspecified: Secondary | ICD-10-CM | POA: Diagnosis not present

## 2020-08-31 DIAGNOSIS — E1122 Type 2 diabetes mellitus with diabetic chronic kidney disease: Secondary | ICD-10-CM | POA: Diagnosis not present

## 2020-08-31 DIAGNOSIS — N183 Chronic kidney disease, stage 3 unspecified: Secondary | ICD-10-CM | POA: Diagnosis not present

## 2020-08-31 DIAGNOSIS — N2581 Secondary hyperparathyroidism of renal origin: Secondary | ICD-10-CM | POA: Diagnosis not present

## 2020-08-31 DIAGNOSIS — I129 Hypertensive chronic kidney disease with stage 1 through stage 4 chronic kidney disease, or unspecified chronic kidney disease: Secondary | ICD-10-CM | POA: Diagnosis not present

## 2020-08-31 DIAGNOSIS — D631 Anemia in chronic kidney disease: Secondary | ICD-10-CM | POA: Diagnosis not present

## 2020-09-13 DIAGNOSIS — Z1211 Encounter for screening for malignant neoplasm of colon: Secondary | ICD-10-CM | POA: Diagnosis not present

## 2020-09-23 LAB — COLOGUARD: COLOGUARD: NEGATIVE

## 2020-09-26 DIAGNOSIS — J019 Acute sinusitis, unspecified: Secondary | ICD-10-CM | POA: Diagnosis not present

## 2020-09-26 DIAGNOSIS — R0981 Nasal congestion: Secondary | ICD-10-CM | POA: Diagnosis not present

## 2020-09-26 DIAGNOSIS — R059 Cough, unspecified: Secondary | ICD-10-CM | POA: Diagnosis not present

## 2020-09-30 DIAGNOSIS — E162 Hypoglycemia, unspecified: Secondary | ICD-10-CM | POA: Diagnosis not present

## 2020-09-30 DIAGNOSIS — E1122 Type 2 diabetes mellitus with diabetic chronic kidney disease: Secondary | ICD-10-CM | POA: Diagnosis not present

## 2020-09-30 DIAGNOSIS — E782 Mixed hyperlipidemia: Secondary | ICD-10-CM | POA: Diagnosis not present

## 2020-10-11 DIAGNOSIS — Z23 Encounter for immunization: Secondary | ICD-10-CM | POA: Diagnosis not present

## 2020-12-19 DIAGNOSIS — H401133 Primary open-angle glaucoma, bilateral, severe stage: Secondary | ICD-10-CM | POA: Diagnosis not present

## 2020-12-21 DIAGNOSIS — R2689 Other abnormalities of gait and mobility: Secondary | ICD-10-CM | POA: Diagnosis not present

## 2020-12-21 DIAGNOSIS — M810 Age-related osteoporosis without current pathological fracture: Secondary | ICD-10-CM | POA: Diagnosis not present

## 2020-12-21 DIAGNOSIS — Z9181 History of falling: Secondary | ICD-10-CM | POA: Diagnosis not present

## 2020-12-21 DIAGNOSIS — E893 Postprocedural hypopituitarism: Secondary | ICD-10-CM | POA: Diagnosis not present

## 2020-12-21 DIAGNOSIS — E119 Type 2 diabetes mellitus without complications: Secondary | ICD-10-CM | POA: Diagnosis not present

## 2020-12-23 DIAGNOSIS — I129 Hypertensive chronic kidney disease with stage 1 through stage 4 chronic kidney disease, or unspecified chronic kidney disease: Secondary | ICD-10-CM | POA: Diagnosis not present

## 2020-12-23 DIAGNOSIS — D631 Anemia in chronic kidney disease: Secondary | ICD-10-CM | POA: Diagnosis not present

## 2020-12-23 DIAGNOSIS — E1122 Type 2 diabetes mellitus with diabetic chronic kidney disease: Secondary | ICD-10-CM | POA: Diagnosis not present

## 2020-12-23 DIAGNOSIS — N1832 Chronic kidney disease, stage 3b: Secondary | ICD-10-CM | POA: Diagnosis not present

## 2020-12-23 DIAGNOSIS — E11319 Type 2 diabetes mellitus with unspecified diabetic retinopathy without macular edema: Secondary | ICD-10-CM | POA: Diagnosis not present

## 2020-12-23 DIAGNOSIS — D509 Iron deficiency anemia, unspecified: Secondary | ICD-10-CM | POA: Diagnosis not present

## 2020-12-23 DIAGNOSIS — Z6834 Body mass index (BMI) 34.0-34.9, adult: Secondary | ICD-10-CM | POA: Diagnosis not present

## 2020-12-23 DIAGNOSIS — E782 Mixed hyperlipidemia: Secondary | ICD-10-CM | POA: Diagnosis not present

## 2020-12-23 DIAGNOSIS — E6609 Other obesity due to excess calories: Secondary | ICD-10-CM | POA: Diagnosis not present

## 2020-12-28 DIAGNOSIS — J309 Allergic rhinitis, unspecified: Secondary | ICD-10-CM | POA: Diagnosis not present

## 2020-12-28 DIAGNOSIS — R945 Abnormal results of liver function studies: Secondary | ICD-10-CM | POA: Diagnosis not present

## 2020-12-28 DIAGNOSIS — N1832 Chronic kidney disease, stage 3b: Secondary | ICD-10-CM | POA: Diagnosis not present

## 2020-12-28 DIAGNOSIS — E1122 Type 2 diabetes mellitus with diabetic chronic kidney disease: Secondary | ICD-10-CM | POA: Diagnosis not present

## 2020-12-28 DIAGNOSIS — D631 Anemia in chronic kidney disease: Secondary | ICD-10-CM | POA: Diagnosis not present

## 2020-12-28 DIAGNOSIS — I129 Hypertensive chronic kidney disease with stage 1 through stage 4 chronic kidney disease, or unspecified chronic kidney disease: Secondary | ICD-10-CM | POA: Diagnosis not present

## 2020-12-28 DIAGNOSIS — E039 Hypothyroidism, unspecified: Secondary | ICD-10-CM | POA: Diagnosis not present

## 2020-12-28 DIAGNOSIS — E782 Mixed hyperlipidemia: Secondary | ICD-10-CM | POA: Diagnosis not present

## 2020-12-28 DIAGNOSIS — K219 Gastro-esophageal reflux disease without esophagitis: Secondary | ICD-10-CM | POA: Diagnosis not present

## 2020-12-30 DIAGNOSIS — R2689 Other abnormalities of gait and mobility: Secondary | ICD-10-CM | POA: Diagnosis not present

## 2020-12-30 DIAGNOSIS — Z298 Encounter for other specified prophylactic measures: Secondary | ICD-10-CM | POA: Diagnosis not present

## 2021-01-06 DIAGNOSIS — Z298 Encounter for other specified prophylactic measures: Secondary | ICD-10-CM | POA: Diagnosis not present

## 2021-01-06 DIAGNOSIS — R2689 Other abnormalities of gait and mobility: Secondary | ICD-10-CM | POA: Diagnosis not present

## 2021-01-13 DIAGNOSIS — R2689 Other abnormalities of gait and mobility: Secondary | ICD-10-CM | POA: Diagnosis not present

## 2021-01-13 DIAGNOSIS — Z298 Encounter for other specified prophylactic measures: Secondary | ICD-10-CM | POA: Diagnosis not present

## 2021-01-19 DIAGNOSIS — R2689 Other abnormalities of gait and mobility: Secondary | ICD-10-CM | POA: Diagnosis not present

## 2021-01-19 DIAGNOSIS — Z298 Encounter for other specified prophylactic measures: Secondary | ICD-10-CM | POA: Diagnosis not present

## 2021-01-27 DIAGNOSIS — Z298 Encounter for other specified prophylactic measures: Secondary | ICD-10-CM | POA: Diagnosis not present

## 2021-01-27 DIAGNOSIS — R2689 Other abnormalities of gait and mobility: Secondary | ICD-10-CM | POA: Diagnosis not present

## 2021-01-29 DIAGNOSIS — K219 Gastro-esophageal reflux disease without esophagitis: Secondary | ICD-10-CM | POA: Diagnosis not present

## 2021-01-29 DIAGNOSIS — E1165 Type 2 diabetes mellitus with hyperglycemia: Secondary | ICD-10-CM | POA: Diagnosis not present

## 2021-02-03 DIAGNOSIS — R2689 Other abnormalities of gait and mobility: Secondary | ICD-10-CM | POA: Diagnosis not present

## 2021-02-03 DIAGNOSIS — Z298 Encounter for other specified prophylactic measures: Secondary | ICD-10-CM | POA: Diagnosis not present

## 2021-02-25 DIAGNOSIS — I898 Other specified noninfective disorders of lymphatic vessels and lymph nodes: Secondary | ICD-10-CM | POA: Diagnosis not present

## 2021-02-25 DIAGNOSIS — K7689 Other specified diseases of liver: Secondary | ICD-10-CM | POA: Diagnosis not present

## 2021-02-25 DIAGNOSIS — R93421 Abnormal radiologic findings on diagnostic imaging of right kidney: Secondary | ICD-10-CM | POA: Diagnosis not present

## 2021-02-25 DIAGNOSIS — I700 Atherosclerosis of aorta: Secondary | ICD-10-CM | POA: Diagnosis not present

## 2021-02-25 DIAGNOSIS — S27329A Contusion of lung, unspecified, initial encounter: Secondary | ICD-10-CM | POA: Diagnosis not present

## 2021-02-25 DIAGNOSIS — S301XXA Contusion of abdominal wall, initial encounter: Secondary | ICD-10-CM | POA: Diagnosis not present

## 2021-02-25 DIAGNOSIS — R928 Other abnormal and inconclusive findings on diagnostic imaging of breast: Secondary | ICD-10-CM | POA: Diagnosis not present

## 2021-02-25 DIAGNOSIS — R109 Unspecified abdominal pain: Secondary | ICD-10-CM | POA: Diagnosis not present

## 2021-02-25 DIAGNOSIS — I2510 Atherosclerotic heart disease of native coronary artery without angina pectoris: Secondary | ICD-10-CM | POA: Diagnosis not present

## 2021-02-25 DIAGNOSIS — S40022A Contusion of left upper arm, initial encounter: Secondary | ICD-10-CM | POA: Diagnosis not present

## 2021-02-25 DIAGNOSIS — R918 Other nonspecific abnormal finding of lung field: Secondary | ICD-10-CM | POA: Diagnosis not present

## 2021-02-25 DIAGNOSIS — R93422 Abnormal radiologic findings on diagnostic imaging of left kidney: Secondary | ICD-10-CM | POA: Diagnosis not present

## 2021-03-07 DIAGNOSIS — K219 Gastro-esophageal reflux disease without esophagitis: Secondary | ICD-10-CM | POA: Diagnosis not present

## 2021-03-07 DIAGNOSIS — E1165 Type 2 diabetes mellitus with hyperglycemia: Secondary | ICD-10-CM | POA: Diagnosis not present

## 2021-03-08 DIAGNOSIS — B370 Candidal stomatitis: Secondary | ICD-10-CM | POA: Diagnosis not present

## 2021-03-21 DIAGNOSIS — N183 Chronic kidney disease, stage 3 unspecified: Secondary | ICD-10-CM | POA: Diagnosis not present

## 2021-03-21 DIAGNOSIS — D631 Anemia in chronic kidney disease: Secondary | ICD-10-CM | POA: Diagnosis not present

## 2021-03-21 DIAGNOSIS — I129 Hypertensive chronic kidney disease with stage 1 through stage 4 chronic kidney disease, or unspecified chronic kidney disease: Secondary | ICD-10-CM | POA: Diagnosis not present

## 2021-03-21 DIAGNOSIS — N2581 Secondary hyperparathyroidism of renal origin: Secondary | ICD-10-CM | POA: Diagnosis not present

## 2021-03-21 DIAGNOSIS — E785 Hyperlipidemia, unspecified: Secondary | ICD-10-CM | POA: Diagnosis not present

## 2021-03-21 DIAGNOSIS — E1122 Type 2 diabetes mellitus with diabetic chronic kidney disease: Secondary | ICD-10-CM | POA: Diagnosis not present

## 2021-04-26 DIAGNOSIS — H401133 Primary open-angle glaucoma, bilateral, severe stage: Secondary | ICD-10-CM | POA: Diagnosis not present

## 2021-04-30 DIAGNOSIS — K219 Gastro-esophageal reflux disease without esophagitis: Secondary | ICD-10-CM | POA: Diagnosis not present

## 2021-04-30 DIAGNOSIS — E1165 Type 2 diabetes mellitus with hyperglycemia: Secondary | ICD-10-CM | POA: Diagnosis not present

## 2021-05-23 DIAGNOSIS — R0982 Postnasal drip: Secondary | ICD-10-CM | POA: Diagnosis not present

## 2021-05-23 DIAGNOSIS — Z20822 Contact with and (suspected) exposure to covid-19: Secondary | ICD-10-CM | POA: Diagnosis not present

## 2021-06-02 DIAGNOSIS — Z20822 Contact with and (suspected) exposure to covid-19: Secondary | ICD-10-CM | POA: Diagnosis not present

## 2021-06-02 DIAGNOSIS — R42 Dizziness and giddiness: Secondary | ICD-10-CM | POA: Diagnosis not present

## 2021-06-26 DIAGNOSIS — E039 Hypothyroidism, unspecified: Secondary | ICD-10-CM | POA: Insufficient documentation

## 2021-06-28 DIAGNOSIS — I10 Essential (primary) hypertension: Secondary | ICD-10-CM | POA: Diagnosis not present

## 2021-06-28 DIAGNOSIS — D509 Iron deficiency anemia, unspecified: Secondary | ICD-10-CM | POA: Diagnosis not present

## 2021-06-28 DIAGNOSIS — E1122 Type 2 diabetes mellitus with diabetic chronic kidney disease: Secondary | ICD-10-CM | POA: Diagnosis not present

## 2021-06-28 DIAGNOSIS — E039 Hypothyroidism, unspecified: Secondary | ICD-10-CM | POA: Diagnosis not present

## 2021-06-30 DIAGNOSIS — E1165 Type 2 diabetes mellitus with hyperglycemia: Secondary | ICD-10-CM | POA: Diagnosis not present

## 2021-06-30 DIAGNOSIS — K219 Gastro-esophageal reflux disease without esophagitis: Secondary | ICD-10-CM | POA: Diagnosis not present

## 2021-07-03 ENCOUNTER — Other Ambulatory Visit: Payer: Self-pay | Admitting: Nurse Practitioner

## 2021-07-03 DIAGNOSIS — Z1231 Encounter for screening mammogram for malignant neoplasm of breast: Secondary | ICD-10-CM

## 2021-07-04 DIAGNOSIS — I129 Hypertensive chronic kidney disease with stage 1 through stage 4 chronic kidney disease, or unspecified chronic kidney disease: Secondary | ICD-10-CM | POA: Diagnosis not present

## 2021-07-04 DIAGNOSIS — E119 Type 2 diabetes mellitus without complications: Secondary | ICD-10-CM | POA: Insufficient documentation

## 2021-07-04 DIAGNOSIS — E039 Hypothyroidism, unspecified: Secondary | ICD-10-CM | POA: Diagnosis not present

## 2021-07-04 DIAGNOSIS — E782 Mixed hyperlipidemia: Secondary | ICD-10-CM | POA: Diagnosis not present

## 2021-07-04 DIAGNOSIS — K219 Gastro-esophageal reflux disease without esophagitis: Secondary | ICD-10-CM | POA: Insufficient documentation

## 2021-07-04 DIAGNOSIS — H409 Unspecified glaucoma: Secondary | ICD-10-CM | POA: Diagnosis not present

## 2021-07-04 DIAGNOSIS — J309 Allergic rhinitis, unspecified: Secondary | ICD-10-CM | POA: Insufficient documentation

## 2021-07-04 DIAGNOSIS — E1122 Type 2 diabetes mellitus with diabetic chronic kidney disease: Secondary | ICD-10-CM | POA: Diagnosis not present

## 2021-07-04 DIAGNOSIS — Z0001 Encounter for general adult medical examination with abnormal findings: Secondary | ICD-10-CM | POA: Diagnosis not present

## 2021-07-04 DIAGNOSIS — E162 Hypoglycemia, unspecified: Secondary | ICD-10-CM | POA: Diagnosis not present

## 2021-07-04 DIAGNOSIS — N183 Chronic kidney disease, stage 3 unspecified: Secondary | ICD-10-CM | POA: Insufficient documentation

## 2021-07-04 DIAGNOSIS — I130 Hypertensive heart and chronic kidney disease with heart failure and stage 1 through stage 4 chronic kidney disease, or unspecified chronic kidney disease: Secondary | ICD-10-CM | POA: Insufficient documentation

## 2021-07-04 DIAGNOSIS — R600 Localized edema: Secondary | ICD-10-CM | POA: Insufficient documentation

## 2021-07-04 DIAGNOSIS — D509 Iron deficiency anemia, unspecified: Secondary | ICD-10-CM | POA: Diagnosis not present

## 2021-07-04 DIAGNOSIS — E559 Vitamin D deficiency, unspecified: Secondary | ICD-10-CM | POA: Insufficient documentation

## 2021-07-04 DIAGNOSIS — Z23 Encounter for immunization: Secondary | ICD-10-CM | POA: Diagnosis not present

## 2021-07-24 DIAGNOSIS — H401133 Primary open-angle glaucoma, bilateral, severe stage: Secondary | ICD-10-CM | POA: Diagnosis not present

## 2021-07-24 DIAGNOSIS — H2513 Age-related nuclear cataract, bilateral: Secondary | ICD-10-CM | POA: Diagnosis not present

## 2021-07-24 DIAGNOSIS — E119 Type 2 diabetes mellitus without complications: Secondary | ICD-10-CM | POA: Diagnosis not present

## 2021-07-24 DIAGNOSIS — H04123 Dry eye syndrome of bilateral lacrimal glands: Secondary | ICD-10-CM | POA: Diagnosis not present

## 2021-07-31 DIAGNOSIS — E1165 Type 2 diabetes mellitus with hyperglycemia: Secondary | ICD-10-CM | POA: Diagnosis not present

## 2021-07-31 DIAGNOSIS — K219 Gastro-esophageal reflux disease without esophagitis: Secondary | ICD-10-CM | POA: Diagnosis not present

## 2021-08-02 DIAGNOSIS — C50912 Malignant neoplasm of unspecified site of left female breast: Secondary | ICD-10-CM | POA: Diagnosis not present

## 2021-08-14 ENCOUNTER — Ambulatory Visit
Admission: RE | Admit: 2021-08-14 | Discharge: 2021-08-14 | Disposition: A | Discharge status: Home / Self Care | Payer: Medicare HMO | Source: Ambulatory Visit | Attending: Nurse Practitioner | Admitting: Nurse Practitioner

## 2021-08-14 DIAGNOSIS — Z1231 Encounter for screening mammogram for malignant neoplasm of breast: Secondary | ICD-10-CM | POA: Diagnosis not present

## 2021-08-16 DIAGNOSIS — Z23 Encounter for immunization: Secondary | ICD-10-CM | POA: Diagnosis not present

## 2021-08-30 DIAGNOSIS — E1165 Type 2 diabetes mellitus with hyperglycemia: Secondary | ICD-10-CM | POA: Diagnosis not present

## 2021-08-30 DIAGNOSIS — K219 Gastro-esophageal reflux disease without esophagitis: Secondary | ICD-10-CM | POA: Diagnosis not present

## 2021-08-31 DIAGNOSIS — J029 Acute pharyngitis, unspecified: Secondary | ICD-10-CM | POA: Diagnosis not present

## 2021-08-31 DIAGNOSIS — R0981 Nasal congestion: Secondary | ICD-10-CM | POA: Diagnosis not present

## 2021-09-29 DIAGNOSIS — E1165 Type 2 diabetes mellitus with hyperglycemia: Secondary | ICD-10-CM | POA: Diagnosis not present

## 2021-09-29 DIAGNOSIS — K219 Gastro-esophageal reflux disease without esophagitis: Secondary | ICD-10-CM | POA: Diagnosis not present

## 2021-10-05 DIAGNOSIS — E782 Mixed hyperlipidemia: Secondary | ICD-10-CM | POA: Diagnosis not present

## 2021-10-05 DIAGNOSIS — E1122 Type 2 diabetes mellitus with diabetic chronic kidney disease: Secondary | ICD-10-CM | POA: Diagnosis not present

## 2021-10-05 DIAGNOSIS — E039 Hypothyroidism, unspecified: Secondary | ICD-10-CM | POA: Diagnosis not present

## 2021-10-05 DIAGNOSIS — E559 Vitamin D deficiency, unspecified: Secondary | ICD-10-CM | POA: Diagnosis not present

## 2021-10-12 DIAGNOSIS — E039 Hypothyroidism, unspecified: Secondary | ICD-10-CM | POA: Diagnosis not present

## 2021-10-12 DIAGNOSIS — D509 Iron deficiency anemia, unspecified: Secondary | ICD-10-CM | POA: Diagnosis not present

## 2021-10-12 DIAGNOSIS — E559 Vitamin D deficiency, unspecified: Secondary | ICD-10-CM | POA: Diagnosis not present

## 2021-10-12 DIAGNOSIS — K219 Gastro-esophageal reflux disease without esophagitis: Secondary | ICD-10-CM | POA: Diagnosis not present

## 2021-10-12 DIAGNOSIS — R809 Proteinuria, unspecified: Secondary | ICD-10-CM | POA: Insufficient documentation

## 2021-10-12 DIAGNOSIS — J309 Allergic rhinitis, unspecified: Secondary | ICD-10-CM | POA: Diagnosis not present

## 2021-10-12 DIAGNOSIS — H409 Unspecified glaucoma: Secondary | ICD-10-CM | POA: Diagnosis not present

## 2021-10-12 DIAGNOSIS — E782 Mixed hyperlipidemia: Secondary | ICD-10-CM | POA: Diagnosis not present

## 2021-10-12 DIAGNOSIS — E1122 Type 2 diabetes mellitus with diabetic chronic kidney disease: Secondary | ICD-10-CM | POA: Diagnosis not present

## 2021-10-12 DIAGNOSIS — I129 Hypertensive chronic kidney disease with stage 1 through stage 4 chronic kidney disease, or unspecified chronic kidney disease: Secondary | ICD-10-CM | POA: Diagnosis not present

## 2021-10-31 DIAGNOSIS — K219 Gastro-esophageal reflux disease without esophagitis: Secondary | ICD-10-CM | POA: Diagnosis not present

## 2021-10-31 DIAGNOSIS — E1165 Type 2 diabetes mellitus with hyperglycemia: Secondary | ICD-10-CM | POA: Diagnosis not present

## 2021-11-13 DIAGNOSIS — H401133 Primary open-angle glaucoma, bilateral, severe stage: Secondary | ICD-10-CM | POA: Diagnosis not present

## 2021-12-22 DIAGNOSIS — E893 Postprocedural hypopituitarism: Secondary | ICD-10-CM | POA: Diagnosis not present

## 2021-12-22 DIAGNOSIS — E119 Type 2 diabetes mellitus without complications: Secondary | ICD-10-CM | POA: Diagnosis not present

## 2021-12-22 DIAGNOSIS — I890 Lymphedema, not elsewhere classified: Secondary | ICD-10-CM | POA: Diagnosis not present

## 2021-12-22 DIAGNOSIS — M810 Age-related osteoporosis without current pathological fracture: Secondary | ICD-10-CM | POA: Diagnosis not present

## 2022-01-17 DIAGNOSIS — E1122 Type 2 diabetes mellitus with diabetic chronic kidney disease: Secondary | ICD-10-CM | POA: Diagnosis not present

## 2022-01-17 DIAGNOSIS — E039 Hypothyroidism, unspecified: Secondary | ICD-10-CM | POA: Diagnosis not present

## 2022-01-17 DIAGNOSIS — E782 Mixed hyperlipidemia: Secondary | ICD-10-CM | POA: Diagnosis not present

## 2022-01-22 DIAGNOSIS — I890 Lymphedema, not elsewhere classified: Secondary | ICD-10-CM | POA: Diagnosis not present

## 2022-01-22 DIAGNOSIS — E893 Postprocedural hypopituitarism: Secondary | ICD-10-CM | POA: Diagnosis not present

## 2022-01-22 DIAGNOSIS — E119 Type 2 diabetes mellitus without complications: Secondary | ICD-10-CM | POA: Diagnosis not present

## 2022-01-22 DIAGNOSIS — M810 Age-related osteoporosis without current pathological fracture: Secondary | ICD-10-CM | POA: Diagnosis not present

## 2022-01-24 DIAGNOSIS — D509 Iron deficiency anemia, unspecified: Secondary | ICD-10-CM | POA: Diagnosis not present

## 2022-01-24 DIAGNOSIS — E559 Vitamin D deficiency, unspecified: Secondary | ICD-10-CM | POA: Diagnosis not present

## 2022-01-24 DIAGNOSIS — J309 Allergic rhinitis, unspecified: Secondary | ICD-10-CM | POA: Diagnosis not present

## 2022-01-24 DIAGNOSIS — I129 Hypertensive chronic kidney disease with stage 1 through stage 4 chronic kidney disease, or unspecified chronic kidney disease: Secondary | ICD-10-CM | POA: Diagnosis not present

## 2022-01-24 DIAGNOSIS — E039 Hypothyroidism, unspecified: Secondary | ICD-10-CM | POA: Diagnosis not present

## 2022-01-24 DIAGNOSIS — I700 Atherosclerosis of aorta: Secondary | ICD-10-CM | POA: Diagnosis not present

## 2022-01-24 DIAGNOSIS — N2581 Secondary hyperparathyroidism of renal origin: Secondary | ICD-10-CM | POA: Insufficient documentation

## 2022-01-24 DIAGNOSIS — E782 Mixed hyperlipidemia: Secondary | ICD-10-CM | POA: Diagnosis not present

## 2022-01-24 DIAGNOSIS — K219 Gastro-esophageal reflux disease without esophagitis: Secondary | ICD-10-CM | POA: Diagnosis not present

## 2022-01-24 DIAGNOSIS — E1122 Type 2 diabetes mellitus with diabetic chronic kidney disease: Secondary | ICD-10-CM | POA: Diagnosis not present

## 2022-01-24 DIAGNOSIS — E669 Obesity, unspecified: Secondary | ICD-10-CM | POA: Insufficient documentation

## 2022-01-24 DIAGNOSIS — H409 Unspecified glaucoma: Secondary | ICD-10-CM | POA: Diagnosis not present

## 2022-02-19 ENCOUNTER — Emergency Department (HOSPITAL_COMMUNITY): Payer: Medicare HMO

## 2022-02-19 ENCOUNTER — Encounter (HOSPITAL_COMMUNITY): Payer: Self-pay | Admitting: *Deleted

## 2022-02-19 ENCOUNTER — Other Ambulatory Visit: Payer: Self-pay

## 2022-02-19 ENCOUNTER — Emergency Department (HOSPITAL_COMMUNITY)
Admission: EM | Admit: 2022-02-19 | Discharge: 2022-02-19 | Disposition: A | Discharge status: Home / Self Care | Payer: Medicare HMO | Attending: Emergency Medicine | Admitting: Emergency Medicine

## 2022-02-19 DIAGNOSIS — Z794 Long term (current) use of insulin: Secondary | ICD-10-CM | POA: Insufficient documentation

## 2022-02-19 DIAGNOSIS — W19XXXA Unspecified fall, initial encounter: Secondary | ICD-10-CM | POA: Diagnosis not present

## 2022-02-19 DIAGNOSIS — Z7982 Long term (current) use of aspirin: Secondary | ICD-10-CM | POA: Diagnosis not present

## 2022-02-19 DIAGNOSIS — M1711 Unilateral primary osteoarthritis, right knee: Secondary | ICD-10-CM | POA: Diagnosis not present

## 2022-02-19 DIAGNOSIS — S32591A Other specified fracture of right pubis, initial encounter for closed fracture: Secondary | ICD-10-CM

## 2022-02-19 DIAGNOSIS — Z79899 Other long term (current) drug therapy: Secondary | ICD-10-CM | POA: Insufficient documentation

## 2022-02-19 DIAGNOSIS — S32501A Unspecified fracture of right pubis, initial encounter for closed fracture: Secondary | ICD-10-CM | POA: Diagnosis not present

## 2022-02-19 DIAGNOSIS — S32511A Fracture of superior rim of right pubis, initial encounter for closed fracture: Secondary | ICD-10-CM | POA: Diagnosis not present

## 2022-02-19 DIAGNOSIS — Z7984 Long term (current) use of oral hypoglycemic drugs: Secondary | ICD-10-CM | POA: Diagnosis not present

## 2022-02-19 DIAGNOSIS — M1611 Unilateral primary osteoarthritis, right hip: Secondary | ICD-10-CM | POA: Diagnosis not present

## 2022-02-19 DIAGNOSIS — S79911A Unspecified injury of right hip, initial encounter: Secondary | ICD-10-CM | POA: Diagnosis present

## 2022-02-19 LAB — CBG MONITORING, ED: Glucose-Capillary: 171 mg/dL — ABNORMAL HIGH (ref 70–99)

## 2022-02-19 MED ORDER — IBUPROFEN 600 MG PO TABS: 600.0000 mg | ORAL_TABLET | Freq: Four times a day (QID) | ORAL | 0 refills | Status: DC | PRN

## 2022-02-19 MED ORDER — FENTANYL CITRATE PF 50 MCG/ML IJ SOSY
50.0000 ug | PREFILLED_SYRINGE | Freq: Once | INTRAMUSCULAR | Status: DC
  Filled 2022-02-19: qty 1

## 2022-02-19 MED ORDER — ONDANSETRON HCL 4 MG PO TABS: 4.0000 mg | ORAL_TABLET | Freq: Four times a day (QID) | ORAL | 0 refills | Status: DC

## 2022-02-19 MED ORDER — OXYCODONE HCL 5 MG PO TABS: 5.0000 mg | ORAL_TABLET | Freq: Four times a day (QID) | ORAL | 0 refills | Status: DC | PRN

## 2022-02-19 MED ORDER — FENTANYL CITRATE PF 50 MCG/ML IJ SOSY
50.0000 ug | PREFILLED_SYRINGE | Freq: Once | INTRAMUSCULAR | Status: AC
  Administered 2022-02-19: 50 ug via INTRAMUSCULAR

## 2022-02-19 MED ORDER — ONDANSETRON 4 MG PO TBDP
4.0000 mg | ORAL_TABLET | Freq: Once | ORAL | Status: AC
  Administered 2022-02-19: 4 mg via ORAL
  Filled 2022-02-19: qty 1

## 2022-02-19 MED ORDER — HYDROCODONE-ACETAMINOPHEN 5-325 MG PO TABS
1.0000 | ORAL_TABLET | Freq: Once | ORAL | Status: AC
  Administered 2022-02-19: 1 via ORAL
  Filled 2022-02-19: qty 1

## 2022-02-19 MED ORDER — OXYCODONE-ACETAMINOPHEN 5-325 MG PO TABS: 1.0000 | ORAL_TABLET | Freq: Four times a day (QID) | ORAL | 0 refills | Status: DC | PRN

## 2022-02-19 NOTE — ED Triage Notes (Signed)
Pt states she stepped off curb wrong and fell landing on her right hip; pt states she had to have assistance getting up

## 2022-02-19 NOTE — ED Provider Notes (Signed)
[facility] Provider Note   CSN: [id number] Arrival date & time: 02/19/22  1537     History  Chief Complaint  Patient presents with   Myrtis Hopping ADRIENNE TROMBETTA is a 76 y.o. female.  Patient is a 76 year old female presenting for hip pain after fall.  Patient states she is off a curb and landed on her right side injuring her right hip.  Denies any head trauma or loss of consciousness.  Patient admits to pain in the right hip.  Denies any sensation loss or motor dysfunction in the right leg.  Denies any back or neck pain.  Denies any other wounds or injuries.  No blood thinner use.  The history is provided by the patient. No language interpreter was used.  Fall Pertinent negatives include no chest pain, no abdominal pain and no shortness of breath.      Home Medications Prior to Admission medications   Medication Sig Start Date End Date Taking? Authorizing Provider  ibuprofen (ADVIL) 600 MG tablet Take 1 tablet (600 mg total) by mouth every 6 (six) hours as needed. 8/78/67  Yes Campbell Stall P, DO  oxyCODONE (ROXICODONE) 5 MG immediate release tablet Take 1 tablet (5 mg total) by mouth every 6 (six) hours as needed for severe pain. 6/72/09  Yes Campbell Stall P, DO  oxyCODONE-acetaminophen (PERCOCET/ROXICET) 5-325 MG tablet Take 1 tablet by mouth every 6 (six) hours as needed for severe pain. 4/70/96  Yes Campbell Stall P, DO  aspirin EC 81 MG tablet Take 81 mg by mouth daily.    [provider]  atorvastatin (LIPITOR) 20 MG tablet atorvastatin 20 mg tablet    [provider]  baclofen (LIORESAL) 20 MG tablet Take 20 mg by mouth 2 (two) times daily. 09/17/18   [provider]  brimonidine (ALPHAGAN P) 0.1 % SOLN Place 1 drop into both eyes 2 (two) times daily.     [provider]  cholecalciferol (VITAMIN D) 1000 UNITS tablet Take 1,000 Units by mouth daily.    [provider]  dorzolamide-timolol (COSOPT) 22.3-6.8 MG/ML  ophthalmic solution Place 1 drop into both eyes 2 (two) times daily.      [provider]  ferrous sulfate 325 (65 FE) MG tablet Take 325 mg by mouth daily.    [provider]  fluticasone Asencion Islam) 50 MCG/ACT nasal spray  09/17/18   [provider]  glipiZIDE (GLUCOTROL XL) 10 MG 24 hr tablet Take 1 tablet by mouth daily. 05/20/19   [provider]  hydrocortisone (CORTEF) 10 MG tablet Take 10 mg by mouth daily.     [provider]  insulin degludec (TRESIBA FLEXTOUCH) 100 UNIT/ML SOPN FlexTouch Pen Tresiba FlexTouch U-100 insulin 100 unit/mL (3 mL) subcutaneous pen    [provider]  insulin NPH-regular Human (NOVOLIN 70/30) (70-30) 100 UNIT/ML injection Inject into the skin.    [provider]  levocetirizine (XYZAL) 5 MG tablet Take 1 tablet by mouth daily.    [provider]  levothyroxine (SYNTHROID, LEVOTHROID) 88 MCG tablet Take 88 mcg by mouth daily before breakfast.    [provider]  methocarbamol (ROBAXIN) 500 MG tablet Take 1 tablet (500 mg total) by mouth 3 (three) times daily. 01/16/18   Lily Kocher, PA-C  montelukast (SINGULAIR) 10 MG tablet montelukast 10 mg tablet    [provider]  olmesartan-hydrochlorothiazide (BENICAR HCT) 40-12.5 MG tablet  09/17/18   [provider]  Omega-3 Fatty Acids (  FISH OIL PO) Take 1 tablet by mouth daily.    [provider]  omeprazole (PRILOSEC) 20 MG capsule Take 20 mg by mouth daily.    [provider]  traMADol (ULTRAM) 50 MG tablet Take 1 tablet (50 mg total) by mouth every 6 (six) hours as needed. 01/16/18   Lily Kocher, PA-C      Allergies    Ambien [zolpidem tartrate], Iodinated contrast media, Zolpidem, Ciprofloxacin hcl, and Codeine    Review of Systems   Review of Systems  Constitutional:  Negative for chills and fever.  HENT:  Negative for ear pain and sore throat.   Eyes:  Negative for pain and visual  disturbance.  Respiratory:  Negative for cough and shortness of breath.   Cardiovascular:  Negative for chest pain and palpitations.  Gastrointestinal:  Negative for abdominal pain and vomiting.  Genitourinary:  Negative for dysuria and hematuria.  Musculoskeletal:  Positive for gait problem. Negative for arthralgias and back pain.       Right hip pain   Skin:  Negative for color change and rash.  Neurological:  Negative for seizures and syncope.  All other systems reviewed and are negative.  Physical Exam Updated Vital Signs BP (!) 154/88   Pulse 73   Temp (!) 97.5 F (36.4 C) (Oral)   Resp 16   Ht '5\' 4"'$  (1.626 m)   Wt 80.3 kg   SpO2 100%   BMI 30.38 kg/m  Physical Exam Vitals and nursing note reviewed.  Constitutional:      General: She is not in acute distress.    Appearance: She is well-developed.  HENT:     Head: Normocephalic and atraumatic.  Eyes:     Conjunctiva/sclera: Conjunctivae normal.  Cardiovascular:     Rate and Rhythm: Normal rate and regular rhythm.     Pulses:          Dorsalis pedis pulses are 2+ on the right side and 2+ on the left side.     Heart sounds: No murmur heard. Pulmonary:     Effort: Pulmonary effort is normal. No respiratory distress.     Breath sounds: Normal breath sounds.  Abdominal:     Palpations: Abdomen is soft.     Tenderness: There is no abdominal tenderness.  Musculoskeletal:        General: No swelling.     Cervical back: Neck supple. No bony tenderness.     Thoracic back: No bony tenderness.     Lumbar back: No bony tenderness.     Right hip: Bony tenderness present. No deformity.     Left hip: Normal.     Right upper leg: Normal.     Left upper leg: Normal.     Right knee: Normal.     Left knee: Normal.     Right lower leg: Normal.     Left lower leg: Normal.     Right ankle: Normal.     Left ankle: Normal.  Skin:    General: Skin is warm and dry.     Capillary Refill: Capillary refill takes less than 2  seconds.  Neurological:     Mental Status: She is alert.  Psychiatric:        Mood and Affect: Mood normal.    ED Results / Procedures / Treatments   Labs (all labs ordered are listed, but only abnormal results are displayed) Labs Reviewed  CBG MONITORING, ED - Abnormal; Notable for the following components:  Result Value   Glucose-Capillary 171 (*)    All other components within normal limits    EKG None  Radiology DG Pelvis 1-2 Views  Result Date: 02/19/2022 CLINICAL DATA:  Fall. Stepped off curb wrong and fell landing on her right hip. EXAM: RIGHT FEMUR 2 VIEWS; PELVIS - 1-2 VIEW COMPARISON:  None Available. FINDINGS: There is a displaced overlapping fracture of the right superior pubic ramus. No appreciable hip fracture. Right femur is intact. Mild arthritic changes of the right hip joint. Moderate to severe right knee osteoarthritis prominent in the patellofemoral compartment. IMPRESSION: Displaced overlapping right superior pubic ramus fracture. Electronically Signed   By: Keane Police D.O.   On: 02/19/2022 16:39   DG Femur Min 2 Views Right  Result Date: 02/19/2022 CLINICAL DATA:  Fall. Stepped off curb wrong and fell landing on her right hip. EXAM: RIGHT FEMUR 2 VIEWS; PELVIS - 1-2 VIEW COMPARISON:  None Available. FINDINGS: There is a displaced overlapping fracture of the right superior pubic ramus. No appreciable hip fracture. Right femur is intact. Mild arthritic changes of the right hip joint. Moderate to severe right knee osteoarthritis prominent in the patellofemoral compartment. IMPRESSION: Displaced overlapping right superior pubic ramus fracture. Electronically Signed   By: Keane Police D.O.   On: 02/19/2022 16:39    Procedures Procedures    Medications Ordered in ED Medications  fentaNYL (SUBLIMAZE) injection 50 mcg (50 mcg Intramuscular Given 02/19/22 1832)  HYDROcodone-acetaminophen (NORCO/VICODIN) 5-325 MG per tablet 1 tablet (1 tablet Oral Given 02/19/22  2152)  ondansetron (ZOFRAN-ODT) disintegrating tablet 4 mg (4 mg Oral Given 02/19/22 2028)    ED Course/ Medical Decision Making/ A&P                           Medical Decision Making Amount and/or Complexity of Data Reviewed Radiology: ordered.  Risk Prescription drug management.   41:85 PM 76 year old female presenting for hip pain after fall.  Is alert and oriented x3, no acute distress, afebrile, stable vital signs.  Physical exam demonstrates tenderness palpation of pelvis.  No gross deformities.  Lower extremities are neurovascularly intact.  X-ray demonstrates "There is a displaced overlapping fracture of the right superior pubic ramus. No appreciable hip fracture. Right femur is intact. Mild arthritic changes of the right hip joint. Moderate ".  Patient requesting orthopedic consult to be placed to [facility] because she had her ankle surgeries completed by Dr. Wylene Simmer in the past.  Recommends palpating for sacral pain at stating that pubic ramus x-rays are often associated with sacral fractures.  Patient has no tenderness to palpation of the back on exam.  On-call orthopedic surgeon for [facility] recommends ambulating with a walker and bearing weight as tolerated with close follow-up in 2 weeks.  Occasions given for pain control.  Patient able to ambulate without difficulty emergency department.  Patient in no distress and overall condition improved here in the ED. Detailed discussions were had with the patient regarding current findings, and need for close f/u with orthopedic surgery.  The patient has been instructed to return immediately if the symptoms worsen in any way for re-evaluation. Patient verbalized understanding and is in agreement with current care plan. All questions answered prior to discharge.         Final Clinical Impression(s) / ED Diagnoses Final diagnoses:  Fall, initial encounter  Closed fracture of ramus of right pubis, initial encounter ([place])     Rx / DC Orders  ED Discharge Orders          Ordered    ibuprofen (ADVIL) 600 MG tablet  Every 6 hours PRN        02/19/22 2325    oxyCODONE (ROXICODONE) 5 MG immediate release tablet  Every 6 hours PRN        02/19/22 2325    oxyCODONE-acetaminophen (PERCOCET/ROXICET) 5-325 MG tablet  Every 6 hours PRN        02/19/22 2325              Lianne Cure, DO 83/50/75 2327

## 2022-02-19 NOTE — Discharge Instructions (Addendum)
Please ambulate with walker as tolerated. Follow up with orthopedic surgery in the next 2 weeks for follow up appointment.

## 2022-03-01 DIAGNOSIS — M25562 Pain in left knee: Secondary | ICD-10-CM | POA: Diagnosis not present

## 2022-03-05 DIAGNOSIS — M25551 Pain in right hip: Secondary | ICD-10-CM | POA: Diagnosis not present

## 2022-03-05 DIAGNOSIS — M9701XD Periprosthetic fracture around internal prosthetic right hip joint, subsequent encounter: Secondary | ICD-10-CM | POA: Diagnosis not present

## 2022-03-16 DIAGNOSIS — M25551 Pain in right hip: Secondary | ICD-10-CM | POA: Diagnosis not present

## 2022-03-26 DIAGNOSIS — M9701XD Periprosthetic fracture around internal prosthetic right hip joint, subsequent encounter: Secondary | ICD-10-CM | POA: Diagnosis not present

## 2022-04-12 DIAGNOSIS — L918 Other hypertrophic disorders of the skin: Secondary | ICD-10-CM | POA: Diagnosis not present

## 2022-04-12 DIAGNOSIS — L821 Other seborrheic keratosis: Secondary | ICD-10-CM | POA: Diagnosis not present

## 2022-04-12 DIAGNOSIS — L820 Inflamed seborrheic keratosis: Secondary | ICD-10-CM | POA: Diagnosis not present

## 2022-04-17 ENCOUNTER — Other Ambulatory Visit (HOSPITAL_COMMUNITY): Payer: Self-pay | Admitting: Internal Medicine

## 2022-04-17 DIAGNOSIS — M85852 Other specified disorders of bone density and structure, left thigh: Secondary | ICD-10-CM

## 2022-04-25 DIAGNOSIS — E119 Type 2 diabetes mellitus without complications: Secondary | ICD-10-CM | POA: Diagnosis not present

## 2022-04-25 DIAGNOSIS — M810 Age-related osteoporosis without current pathological fracture: Secondary | ICD-10-CM | POA: Diagnosis not present

## 2022-04-25 DIAGNOSIS — Z8781 Personal history of (healed) traumatic fracture: Secondary | ICD-10-CM | POA: Diagnosis not present

## 2022-04-25 DIAGNOSIS — E893 Postprocedural hypopituitarism: Secondary | ICD-10-CM | POA: Diagnosis not present

## 2022-05-02 ENCOUNTER — Ambulatory Visit (HOSPITAL_COMMUNITY)
Admission: RE | Admit: 2022-05-02 | Discharge: 2022-05-02 | Disposition: A | Discharge status: Home / Self Care | Payer: Medicare HMO | Source: Ambulatory Visit | Attending: Internal Medicine | Admitting: Internal Medicine

## 2022-05-02 DIAGNOSIS — M8589 Other specified disorders of bone density and structure, multiple sites: Secondary | ICD-10-CM | POA: Diagnosis not present

## 2022-05-02 DIAGNOSIS — M85852 Other specified disorders of bone density and structure, left thigh: Secondary | ICD-10-CM | POA: Insufficient documentation

## 2022-05-02 DIAGNOSIS — Z780 Asymptomatic menopausal state: Secondary | ICD-10-CM | POA: Diagnosis not present

## 2022-05-21 DIAGNOSIS — S72009A Fracture of unspecified part of neck of unspecified femur, initial encounter for closed fracture: Secondary | ICD-10-CM | POA: Insufficient documentation

## 2022-05-21 DIAGNOSIS — S329XXA Fracture of unspecified parts of lumbosacral spine and pelvis, initial encounter for closed fracture: Secondary | ICD-10-CM | POA: Diagnosis not present

## 2022-05-21 DIAGNOSIS — M9701XD Periprosthetic fracture around internal prosthetic right hip joint, subsequent encounter: Secondary | ICD-10-CM | POA: Diagnosis not present

## 2022-05-22 DIAGNOSIS — E039 Hypothyroidism, unspecified: Secondary | ICD-10-CM | POA: Diagnosis not present

## 2022-05-22 DIAGNOSIS — E1122 Type 2 diabetes mellitus with diabetic chronic kidney disease: Secondary | ICD-10-CM | POA: Diagnosis not present

## 2022-05-22 DIAGNOSIS — E782 Mixed hyperlipidemia: Secondary | ICD-10-CM | POA: Diagnosis not present

## 2022-05-23 DIAGNOSIS — H401133 Primary open-angle glaucoma, bilateral, severe stage: Secondary | ICD-10-CM | POA: Diagnosis not present

## 2022-05-25 DIAGNOSIS — N2581 Secondary hyperparathyroidism of renal origin: Secondary | ICD-10-CM | POA: Diagnosis not present

## 2022-05-25 DIAGNOSIS — N183 Chronic kidney disease, stage 3 unspecified: Secondary | ICD-10-CM | POA: Diagnosis not present

## 2022-05-25 DIAGNOSIS — E785 Hyperlipidemia, unspecified: Secondary | ICD-10-CM | POA: Diagnosis not present

## 2022-05-25 DIAGNOSIS — I129 Hypertensive chronic kidney disease with stage 1 through stage 4 chronic kidney disease, or unspecified chronic kidney disease: Secondary | ICD-10-CM | POA: Diagnosis not present

## 2022-05-25 DIAGNOSIS — D631 Anemia in chronic kidney disease: Secondary | ICD-10-CM | POA: Diagnosis not present

## 2022-05-25 DIAGNOSIS — E1122 Type 2 diabetes mellitus with diabetic chronic kidney disease: Secondary | ICD-10-CM | POA: Diagnosis not present

## 2022-05-29 DIAGNOSIS — E1122 Type 2 diabetes mellitus with diabetic chronic kidney disease: Secondary | ICD-10-CM | POA: Diagnosis not present

## 2022-05-29 DIAGNOSIS — E782 Mixed hyperlipidemia: Secondary | ICD-10-CM | POA: Diagnosis not present

## 2022-05-29 DIAGNOSIS — D509 Iron deficiency anemia, unspecified: Secondary | ICD-10-CM | POA: Diagnosis not present

## 2022-05-29 DIAGNOSIS — I700 Atherosclerosis of aorta: Secondary | ICD-10-CM | POA: Diagnosis not present

## 2022-05-29 DIAGNOSIS — E559 Vitamin D deficiency, unspecified: Secondary | ICD-10-CM | POA: Diagnosis not present

## 2022-05-29 DIAGNOSIS — M8580 Other specified disorders of bone density and structure, unspecified site: Secondary | ICD-10-CM | POA: Insufficient documentation

## 2022-05-29 DIAGNOSIS — K219 Gastro-esophageal reflux disease without esophagitis: Secondary | ICD-10-CM | POA: Diagnosis not present

## 2022-05-29 DIAGNOSIS — I129 Hypertensive chronic kidney disease with stage 1 through stage 4 chronic kidney disease, or unspecified chronic kidney disease: Secondary | ICD-10-CM | POA: Diagnosis not present

## 2022-05-29 DIAGNOSIS — H4010X0 Unspecified open-angle glaucoma, stage unspecified: Secondary | ICD-10-CM | POA: Diagnosis not present

## 2022-05-29 DIAGNOSIS — E039 Hypothyroidism, unspecified: Secondary | ICD-10-CM | POA: Diagnosis not present

## 2022-06-26 DIAGNOSIS — M25562 Pain in left knee: Secondary | ICD-10-CM | POA: Diagnosis not present

## 2022-06-26 DIAGNOSIS — M170 Bilateral primary osteoarthritis of knee: Secondary | ICD-10-CM | POA: Insufficient documentation

## 2022-07-04 ENCOUNTER — Other Ambulatory Visit: Payer: Self-pay | Admitting: Nurse Practitioner

## 2022-07-04 DIAGNOSIS — Z1231 Encounter for screening mammogram for malignant neoplasm of breast: Secondary | ICD-10-CM

## 2022-07-05 DIAGNOSIS — Z0000 Encounter for general adult medical examination without abnormal findings: Secondary | ICD-10-CM | POA: Diagnosis not present

## 2022-07-12 ENCOUNTER — Encounter: Payer: Self-pay | Admitting: Podiatry

## 2022-07-12 ENCOUNTER — Ambulatory Visit: Payer: Medicare HMO | Admitting: Podiatry

## 2022-07-12 DIAGNOSIS — E893 Postprocedural hypopituitarism: Secondary | ICD-10-CM | POA: Diagnosis not present

## 2022-07-12 DIAGNOSIS — L600 Ingrowing nail: Secondary | ICD-10-CM | POA: Diagnosis not present

## 2022-07-12 DIAGNOSIS — B351 Tinea unguium: Secondary | ICD-10-CM

## 2022-07-12 DIAGNOSIS — M79674 Pain in right toe(s): Secondary | ICD-10-CM

## 2022-07-12 DIAGNOSIS — M79675 Pain in left toe(s): Secondary | ICD-10-CM | POA: Diagnosis not present

## 2022-07-13 NOTE — Progress Notes (Signed)
Subjective:   Patient ID: Alexis Singh, female   DOB: 76 y.o.   MRN: [id number]   HPI Patient concerned about nail disease and a split of the left big toenail that has occurred over the last few months with her nails being tender and hard for her to cut and concerned that this 1 may need to be removed.  Patient does not smoke likes to be active   Review of Systems  All other systems reviewed and are negative.       Objective:  Physical Exam Vitals and nursing note reviewed.  Constitutional:      Appearance: She is well-developed.  Pulmonary:     Effort: Pulmonary effort is normal.  Musculoskeletal:        General: Normal range of motion.  Skin:    General: Skin is warm.  Neurological:     Mental Status: She is alert.     Neurovascular status found to be intact muscle strength was found to be adequate range of motion within normal limits.  Patient is noted to have thickened toenails 1-5 both feet that are moderately dystrophic and is noted to have a distal crack of the left hallux nail with the distal two thirds of the nail being loose and involved but no erythema edema drainage     Assessment:  Chronic mycotic nail infection 1-5 both feet with a chronically damaged left hallux nail     Plan:  H&P reviewed condition and sterile debridement of the nailbed accomplished with no iatrogenic bleeding and I debrided all nails and then discussed removal of the left which may be done in the future if symptoms were to come back and it were to become a problem again.  Reappoint as needed

## 2022-07-31 DIAGNOSIS — H35373 Puckering of macula, bilateral: Secondary | ICD-10-CM | POA: Diagnosis not present

## 2022-07-31 DIAGNOSIS — H04123 Dry eye syndrome of bilateral lacrimal glands: Secondary | ICD-10-CM | POA: Diagnosis not present

## 2022-07-31 DIAGNOSIS — H25813 Combined forms of age-related cataract, bilateral: Secondary | ICD-10-CM | POA: Diagnosis not present

## 2022-07-31 DIAGNOSIS — H401133 Primary open-angle glaucoma, bilateral, severe stage: Secondary | ICD-10-CM | POA: Diagnosis not present

## 2022-08-10 DIAGNOSIS — M810 Age-related osteoporosis without current pathological fracture: Secondary | ICD-10-CM | POA: Diagnosis not present

## 2022-08-10 DIAGNOSIS — Z8781 Personal history of (healed) traumatic fracture: Secondary | ICD-10-CM | POA: Diagnosis not present

## 2022-08-10 DIAGNOSIS — E893 Postprocedural hypopituitarism: Secondary | ICD-10-CM | POA: Diagnosis not present

## 2022-08-10 DIAGNOSIS — E119 Type 2 diabetes mellitus without complications: Secondary | ICD-10-CM | POA: Diagnosis not present

## 2022-08-10 DIAGNOSIS — L659 Nonscarring hair loss, unspecified: Secondary | ICD-10-CM | POA: Diagnosis not present

## 2022-08-14 ENCOUNTER — Other Ambulatory Visit (HOSPITAL_COMMUNITY): Payer: Self-pay | Admitting: Internal Medicine

## 2022-08-14 DIAGNOSIS — L659 Nonscarring hair loss, unspecified: Secondary | ICD-10-CM | POA: Diagnosis not present

## 2022-08-14 DIAGNOSIS — Z8781 Personal history of (healed) traumatic fracture: Secondary | ICD-10-CM | POA: Diagnosis not present

## 2022-08-14 DIAGNOSIS — E119 Type 2 diabetes mellitus without complications: Secondary | ICD-10-CM | POA: Diagnosis not present

## 2022-08-14 DIAGNOSIS — M810 Age-related osteoporosis without current pathological fracture: Secondary | ICD-10-CM | POA: Diagnosis not present

## 2022-08-14 DIAGNOSIS — E288 Other ovarian dysfunction: Secondary | ICD-10-CM

## 2022-08-14 DIAGNOSIS — E893 Postprocedural hypopituitarism: Secondary | ICD-10-CM | POA: Diagnosis not present

## 2022-08-15 ENCOUNTER — Ambulatory Visit
Admission: RE | Admit: 2022-08-15 | Discharge: 2022-08-15 | Disposition: A | Discharge status: Home / Self Care | Payer: Medicare HMO | Source: Ambulatory Visit | Attending: Nurse Practitioner | Admitting: Nurse Practitioner

## 2022-08-15 DIAGNOSIS — Z1231 Encounter for screening mammogram for malignant neoplasm of breast: Secondary | ICD-10-CM | POA: Diagnosis not present

## 2022-08-15 HISTORY — DX: Malignant neoplasm of unspecified site of unspecified female breast: C50.919

## 2022-08-20 ENCOUNTER — Encounter: Payer: Self-pay | Admitting: Nurse Practitioner

## 2022-08-20 ENCOUNTER — Ambulatory Visit (INDEPENDENT_AMBULATORY_CARE_PROVIDER_SITE_OTHER): Payer: Medicare HMO | Admitting: Nurse Practitioner

## 2022-08-20 VITALS — BP 122/68 | HR 71 | Ht 64.0 in | Wt 177.0 lb

## 2022-08-20 DIAGNOSIS — Z780 Asymptomatic menopausal state: Secondary | ICD-10-CM

## 2022-08-20 DIAGNOSIS — Z23 Encounter for immunization: Secondary | ICD-10-CM

## 2022-08-20 DIAGNOSIS — Z01419 Encounter for gynecological examination (general) (routine) without abnormal findings: Secondary | ICD-10-CM | POA: Diagnosis not present

## 2022-08-20 DIAGNOSIS — M810 Age-related osteoporosis without current pathological fracture: Secondary | ICD-10-CM

## 2022-08-20 NOTE — Progress Notes (Addendum)
Alexis Singh Feb 09, 1946 [id number]   History:  76 y.o. G2P1011 presents for breast and pelvic exam without GYN complaints. S/P 1990 [facility] BSO for fibroids, no HRT. Normal pap history. 1994 left breast cancer treated with lumpectomy, radiation, and chemotherapy. Osteoporosis managed by endocrinology, did not tolerate fosamax and Prolia was too expensive. DM, HTN, HLD, hypothyroidism. Evaluated by endocrinology for hair loss and found to have elevated testosterone levels. Scheduled for abdominal CT scan tomorrow.   Gynecologic History No LMP recorded. Patient has had a hysterectomy.   Contraception: status post hysterectomy Sexually active: Yes  Health maintenance Last Pap: No longer screening per guidelines Last mammogram: 08/15/2022. Results were: Normal Last colonoscopy: 06/2010. Results were: hemorrhoids, 10-year recall recommended Last Dexa: 05/02/2022. Results were: T-score -1.9  Past medical history, past surgical history, family history and social history were all reviewed and documented in the EPIC chart. Married. Son, has 2 kids ages 44 and 66. 26 yo great granddaughter.  ROS:  A ROS was performed and pertinent positives and negatives are included.  Exam:  Vitals:   08/20/22 0837  BP: 122/68  Pulse: 71  SpO2: 94%  Weight: 177 lb (80.3 kg)  Height: '5\' 4"'$  (1.626 m)    Body mass index is 30.38 kg/m.  General appearance:  Normal Thyroid:  Symmetrical, normal in size, without palpable masses or nodularity. Respiratory  Auscultation:  Clear without wheezing or rhonchi Cardiovascular  Auscultation:  Regular rate, without rubs, murmurs or gallops  Edema/varicosities:  Not grossly evident Abdominal  Soft,nontender, without masses, guarding or rebound.  Liver/spleen:  No organomegaly noted  Hernia:  None appreciated  Skin  Inspection:  Grossly normal Breasts: Examined lying and sitting.   Right: Without masses, retractions, nipple discharge or axillary  adenopathy.   Left: Without masses, retractions, nipple discharge or axillary adenopathy. Genitourinary   Inguinal/mons:  Normal without inguinal adenopathy  External genitalia:  Normal appearing vulva with no masses, tenderness, or lesions  BUS/Urethra/Skene's glands:  Normal  Vagina:  Normal appearing with normal color and discharge, no lesions  Cervix:  And uterus absent  Adnexa/parametria:     Rt: Normal in size, without masses or tenderness.   Lt: Normal in size, without masses or tenderness.  Anus and perineum: Normal  Digital rectal exam: Normal sphincter tone without palpated masses or tenderness  Patient informed chaperone available to be present for breast and pelvic exam. Patient has requested no chaperone to be present. Patient has been advised what will be completed during breast and pelvic exam.   Assessment/Plan:  76 y.o. G2P1011 for annual exam.   Encounter for breast and pelvic examination - Education provided on SBEs, importance of preventative screenings, current guidelines, high calcium diet, regular exercise, and multivitamin daily. Labs with PCP and endocrinology.   Need for immunization against influenza - Plan: Flu Vaccine QUAD High Dose(Fluad)  Postmenopausal - no HRT  Age-related osteoporosis without current pathological fracture - T-score -1.9 in August. Exercises as tolerated, continue Vitamin D + Calcium. Endocrinology manages. Did not tolerate Fosamax and Prolia was too expensive.  Screening for cervical cancer - Normal Pap history. No longer screening per guidelines.  Screening for breast cancer - Normal breast exam today. Left breast cancer in 1994. UTD on mammogram.   Screening for colon cancer - Overdue for screening colonscopy. Discussed importance of preventative screenings. She plans to schedule this soon.   Return in 2 years for breast and pelvic exam.      Norman Piacentini A Juleen China [facility],  8:57 AM 08/20/2022

## 2022-08-21 ENCOUNTER — Ambulatory Visit (HOSPITAL_COMMUNITY)
Admission: RE | Admit: 2022-08-21 | Discharge: 2022-08-21 | Disposition: A | Discharge status: Home / Self Care | Payer: Medicare HMO | Source: Ambulatory Visit | Attending: Internal Medicine | Admitting: Internal Medicine

## 2022-08-21 DIAGNOSIS — E288 Other ovarian dysfunction: Secondary | ICD-10-CM | POA: Insufficient documentation

## 2022-08-21 DIAGNOSIS — K7689 Other specified diseases of liver: Secondary | ICD-10-CM | POA: Diagnosis not present

## 2022-08-21 DIAGNOSIS — N2889 Other specified disorders of kidney and ureter: Secondary | ICD-10-CM | POA: Diagnosis not present

## 2022-08-21 DIAGNOSIS — I700 Atherosclerosis of aorta: Secondary | ICD-10-CM | POA: Diagnosis not present

## 2022-08-21 DIAGNOSIS — N281 Cyst of kidney, acquired: Secondary | ICD-10-CM | POA: Diagnosis not present

## 2022-08-29 DIAGNOSIS — R932 Abnormal findings on diagnostic imaging of liver and biliary tract: Secondary | ICD-10-CM | POA: Diagnosis not present

## 2022-09-21 DIAGNOSIS — E893 Postprocedural hypopituitarism: Secondary | ICD-10-CM | POA: Diagnosis not present

## 2022-09-26 DIAGNOSIS — E1122 Type 2 diabetes mellitus with diabetic chronic kidney disease: Secondary | ICD-10-CM | POA: Diagnosis not present

## 2022-09-26 DIAGNOSIS — E782 Mixed hyperlipidemia: Secondary | ICD-10-CM | POA: Diagnosis not present

## 2022-09-26 DIAGNOSIS — E039 Hypothyroidism, unspecified: Secondary | ICD-10-CM | POA: Diagnosis not present

## 2022-09-27 DIAGNOSIS — H401133 Primary open-angle glaucoma, bilateral, severe stage: Secondary | ICD-10-CM | POA: Diagnosis not present

## 2022-09-27 DIAGNOSIS — H25813 Combined forms of age-related cataract, bilateral: Secondary | ICD-10-CM | POA: Diagnosis not present

## 2022-10-02 DIAGNOSIS — R7989 Other specified abnormal findings of blood chemistry: Secondary | ICD-10-CM | POA: Insufficient documentation

## 2022-10-11 ENCOUNTER — Other Ambulatory Visit: Payer: Self-pay | Admitting: Physician Assistant

## 2022-10-11 DIAGNOSIS — Q446 Cystic disease of liver: Secondary | ICD-10-CM

## 2022-10-24 ENCOUNTER — Ambulatory Visit
Admission: RE | Admit: 2022-10-24 | Discharge: 2022-10-24 | Disposition: A | Discharge status: Home / Self Care | Payer: Medicare HMO | Source: Ambulatory Visit | Attending: Physician Assistant | Admitting: Physician Assistant

## 2022-10-24 DIAGNOSIS — Q446 Cystic disease of liver: Secondary | ICD-10-CM

## 2022-10-24 MED ORDER — GADOPICLENOL 0.5 MMOL/ML IV SOLN
8.0000 mL | Freq: Once | INTRAVENOUS | Status: AC | PRN
Start: 2022-10-24 — End: 2022-10-24
  Administered 2022-10-24: 8 mL via INTRAVENOUS

## 2022-10-29 ENCOUNTER — Other Ambulatory Visit: Payer: Self-pay | Admitting: Physician Assistant

## 2022-10-29 DIAGNOSIS — Z853 Personal history of malignant neoplasm of breast: Secondary | ICD-10-CM

## 2022-10-29 DIAGNOSIS — M899 Disorder of bone, unspecified: Secondary | ICD-10-CM

## 2022-10-29 DIAGNOSIS — R9389 Abnormal findings on diagnostic imaging of other specified body structures: Secondary | ICD-10-CM

## 2022-10-30 ENCOUNTER — Other Ambulatory Visit: Payer: Self-pay | Admitting: Physician Assistant

## 2022-10-30 DIAGNOSIS — M899 Disorder of bone, unspecified: Secondary | ICD-10-CM

## 2022-10-30 DIAGNOSIS — C50919 Malignant neoplasm of unspecified site of unspecified female breast: Secondary | ICD-10-CM

## 2022-10-30 DIAGNOSIS — R935 Abnormal findings on diagnostic imaging of other abdominal regions, including retroperitoneum: Secondary | ICD-10-CM

## 2022-11-19 ENCOUNTER — Ambulatory Visit
Admission: RE | Admit: 2022-11-19 | Discharge: 2022-11-19 | Disposition: A | Discharge status: Home / Self Care | Payer: Medicare Other | Source: Ambulatory Visit | Attending: Physician Assistant | Admitting: Physician Assistant

## 2022-11-19 DIAGNOSIS — C50919 Malignant neoplasm of unspecified site of unspecified female breast: Secondary | ICD-10-CM

## 2022-11-19 DIAGNOSIS — R935 Abnormal findings on diagnostic imaging of other abdominal regions, including retroperitoneum: Secondary | ICD-10-CM

## 2022-11-19 DIAGNOSIS — M899 Disorder of bone, unspecified: Secondary | ICD-10-CM

## 2022-11-19 MED ORDER — GADOPICLENOL 0.5 MMOL/ML IV SOLN
7.5000 mL | Freq: Once | INTRAVENOUS | Status: AC | PRN
Start: 2022-11-19 — End: 2022-11-19
  Administered 2022-11-19: 7.5 mL via INTRAVENOUS

## 2022-11-29 ENCOUNTER — Encounter: Payer: Medicare Other | Attending: Internal Medicine | Admitting: Nutrition

## 2022-11-29 ENCOUNTER — Encounter: Payer: Self-pay | Admitting: Nutrition

## 2022-11-29 VITALS — Ht 64.0 in | Wt 171.0 lb

## 2022-11-29 DIAGNOSIS — K2210 Ulcer of esophagus without bleeding: Secondary | ICD-10-CM

## 2022-11-29 NOTE — Progress Notes (Signed)
Medical Nutrition Therapy  Appointment Start time:  [id number]  Appointment End time:  1615  Primary concerns today: Ulcer on throat  Referral diagnosis: k22.10 Preferred learning style: NO Preference  Learning readiness: Ready    NUTRITION ASSESSMENT  77 yr old bfemale here for esophageal ulcer. She wants to know what she should and shouldn't eat. Was prescribed Prilosec Twice a day after her EGD found an ulcer on there esophagus.  PMH: Diabetes Type 2, HTN, Hyperlipdemia and Thyroid issues..  Diet is high in processed foods, high fast meats and acidic foods.  She is willing to work on eating less processed and acidic/spicy foods.  Will work on eating more whole plant based foods.   Anthropometrics  Wt Readings from Last 3 Encounters:  11/29/22 171 lb (77.6 kg)  08/20/22 177 lb (80.3 kg)  02/19/22 177 lb (80.3 kg)   Ht Readings from Last 3 Encounters:  11/29/22 '5\' 4"'$  (1.626 m)  08/20/22 '5\' 4"'$  (1.626 m)  02/19/22 '5\' 4"'$  (1.626 m)   Body mass index is 29.35 kg/m. '@BMIFA'$ @ Facility age limit for growth %iles is 20 years. Facility age limit for growth %iles is 20 years.    Clinical Medical Hx: See char Medications: see chart Labs:  See chart  Notable Signs/Symptoms: indigestion, heart burn, reflux  Lifestyle & Dietary Hx Married and lives with her husband,  Estimated daily fluid intake: 32 oz Supplements:  Sleep:  Stress / self-care:  Current average weekly physical activity: ADL  24-Hr Dietary Recall Eats 2-3 meals per day. Snacks late at night. Snacks some during day, Drinks juices. Chews gun. Likes bacon and processed meats of bologna and other processed foods.  Estimated Energy Needs Calories: 1200 Carbohydrate: 135g Protein: 90g Fat: 33g   NUTRITION DIAGNOSIS  NB-1.1 Food and nutrition-related knowledge deficit As related to GERD and Ulcer on her esophagus..  As evidenced by diet high in processed, acidic, spicy foods..   NUTRITION INTERVENTION  Nutrition  education (E-1) on the following topics:  GERD nutrition therapy  Handouts Provided Include  GERD handouts  Learning Style & Readiness for Change Teaching method utilized: Visual & Auditory  Demonstrated degree of understanding via: Teach Back  Barriers to learning/adherence to lifestyle change: none  Goals Established by Pt Goals  Avoid high fat , high salt, spicy, acidic foods Chew few thoroughly Eat smaller portions, Cut out juices, gum and spicy acidic drinks, carbonated beverages. Cut out processed foods and eat more whole plant based foods from a garden Drink only water Food Reflux handouts   MONITORING & EVALUATION Dietary intake, weekly physical activity, PRN  Next Steps  Patient is to work on avoiding foods discussed and eating more whole plant based foods.Marland Kitchen

## 2022-11-29 NOTE — Patient Instructions (Signed)
Goals  Avoid high fat , high salt, spicy, acidic foods Chew few thoroughly Eat smaller portions, Cut out juices, gum and spicy acidic drinks, carbonated beverages. Cut out processed foods and eat more whole plant based foods from a garden Drink only water Food Reflux handouts

## 2023-01-04 DIAGNOSIS — M25561 Pain in right knee: Secondary | ICD-10-CM | POA: Diagnosis not present

## 2023-01-07 DIAGNOSIS — E039 Hypothyroidism, unspecified: Secondary | ICD-10-CM | POA: Diagnosis not present

## 2023-01-07 DIAGNOSIS — E559 Vitamin D deficiency, unspecified: Secondary | ICD-10-CM | POA: Diagnosis not present

## 2023-01-07 DIAGNOSIS — E1122 Type 2 diabetes mellitus with diabetic chronic kidney disease: Secondary | ICD-10-CM | POA: Diagnosis not present

## 2023-01-07 DIAGNOSIS — E782 Mixed hyperlipidemia: Secondary | ICD-10-CM | POA: Diagnosis not present

## 2023-01-10 ENCOUNTER — Other Ambulatory Visit (HOSPITAL_COMMUNITY): Payer: Self-pay | Admitting: Internal Medicine

## 2023-01-10 DIAGNOSIS — H4010X0 Unspecified open-angle glaucoma, stage unspecified: Secondary | ICD-10-CM | POA: Diagnosis not present

## 2023-01-10 DIAGNOSIS — I700 Atherosclerosis of aorta: Secondary | ICD-10-CM | POA: Diagnosis not present

## 2023-01-10 DIAGNOSIS — K7460 Unspecified cirrhosis of liver: Secondary | ICD-10-CM | POA: Insufficient documentation

## 2023-01-10 DIAGNOSIS — R600 Localized edema: Secondary | ICD-10-CM | POA: Diagnosis not present

## 2023-01-10 DIAGNOSIS — E559 Vitamin D deficiency, unspecified: Secondary | ICD-10-CM | POA: Diagnosis not present

## 2023-01-10 DIAGNOSIS — E039 Hypothyroidism, unspecified: Secondary | ICD-10-CM | POA: Diagnosis not present

## 2023-01-10 DIAGNOSIS — I129 Hypertensive chronic kidney disease with stage 1 through stage 4 chronic kidney disease, or unspecified chronic kidney disease: Secondary | ICD-10-CM | POA: Diagnosis not present

## 2023-01-10 DIAGNOSIS — E1122 Type 2 diabetes mellitus with diabetic chronic kidney disease: Secondary | ICD-10-CM | POA: Diagnosis not present

## 2023-01-10 DIAGNOSIS — K219 Gastro-esophageal reflux disease without esophagitis: Secondary | ICD-10-CM | POA: Diagnosis not present

## 2023-01-10 DIAGNOSIS — E782 Mixed hyperlipidemia: Secondary | ICD-10-CM | POA: Diagnosis not present

## 2023-01-10 DIAGNOSIS — N1832 Chronic kidney disease, stage 3b: Secondary | ICD-10-CM | POA: Diagnosis not present

## 2023-01-10 DIAGNOSIS — D509 Iron deficiency anemia, unspecified: Secondary | ICD-10-CM | POA: Diagnosis not present

## 2023-01-10 DIAGNOSIS — Z0001 Encounter for general adult medical examination with abnormal findings: Secondary | ICD-10-CM | POA: Diagnosis not present

## 2023-01-22 DIAGNOSIS — K449 Diaphragmatic hernia without obstruction or gangrene: Secondary | ICD-10-CM | POA: Diagnosis not present

## 2023-01-22 DIAGNOSIS — K2930 Chronic superficial gastritis without bleeding: Secondary | ICD-10-CM | POA: Diagnosis not present

## 2023-01-22 DIAGNOSIS — K289 Gastrojejunal ulcer, unspecified as acute or chronic, without hemorrhage or perforation: Secondary | ICD-10-CM | POA: Diagnosis not present

## 2023-01-24 DIAGNOSIS — H2513 Age-related nuclear cataract, bilateral: Secondary | ICD-10-CM | POA: Diagnosis not present

## 2023-01-24 DIAGNOSIS — H401133 Primary open-angle glaucoma, bilateral, severe stage: Secondary | ICD-10-CM | POA: Diagnosis not present

## 2023-01-25 ENCOUNTER — Ambulatory Visit (HOSPITAL_COMMUNITY): Payer: Medicare Other

## 2023-01-25 DIAGNOSIS — E893 Postprocedural hypopituitarism: Secondary | ICD-10-CM | POA: Diagnosis not present

## 2023-01-25 DIAGNOSIS — M810 Age-related osteoporosis without current pathological fracture: Secondary | ICD-10-CM | POA: Diagnosis not present

## 2023-01-25 DIAGNOSIS — E119 Type 2 diabetes mellitus without complications: Secondary | ICD-10-CM | POA: Diagnosis not present

## 2023-01-25 DIAGNOSIS — Z8781 Personal history of (healed) traumatic fracture: Secondary | ICD-10-CM | POA: Diagnosis not present

## 2023-01-28 ENCOUNTER — Ambulatory Visit (HOSPITAL_COMMUNITY)
Admission: RE | Admit: 2023-01-28 | Discharge: 2023-01-28 | Disposition: A | Discharge status: Home / Self Care | Payer: Medicare Other | Source: Ambulatory Visit | Attending: Internal Medicine | Admitting: Internal Medicine

## 2023-01-28 DIAGNOSIS — I739 Peripheral vascular disease, unspecified: Secondary | ICD-10-CM | POA: Diagnosis not present

## 2023-01-28 DIAGNOSIS — R600 Localized edema: Secondary | ICD-10-CM

## 2023-01-29 DIAGNOSIS — R0989 Other specified symptoms and signs involving the circulatory and respiratory systems: Secondary | ICD-10-CM | POA: Insufficient documentation

## 2023-01-31 DIAGNOSIS — N1832 Chronic kidney disease, stage 3b: Secondary | ICD-10-CM | POA: Diagnosis not present

## 2023-03-20 ENCOUNTER — Encounter: Payer: Medicare Other | Admitting: Vascular Surgery

## 2023-03-23 DIAGNOSIS — J069 Acute upper respiratory infection, unspecified: Secondary | ICD-10-CM | POA: Diagnosis not present

## 2023-03-23 DIAGNOSIS — L239 Allergic contact dermatitis, unspecified cause: Secondary | ICD-10-CM | POA: Diagnosis not present

## 2023-04-12 DIAGNOSIS — H401133 Primary open-angle glaucoma, bilateral, severe stage: Secondary | ICD-10-CM | POA: Diagnosis not present

## 2023-04-12 DIAGNOSIS — H2513 Age-related nuclear cataract, bilateral: Secondary | ICD-10-CM | POA: Diagnosis not present

## 2023-04-24 NOTE — Progress Notes (Signed)
Office Note     CC: PAD evaluation Requesting Provider:  Benita Stabile, MD  HPI: Alexis Singh is a 77 y.o. (Jun 30, 1946) female presenting at the request of .Benita Stabile, MD for PAD evaluation.  On exam today, Zsofia was doing well.  Originally from [place], she now lives in [place].  She worked in [employer], prior to closing.  She has 1 child and multiple grandchildren/great-grandchildren.  She still lives independently with her husband and enjoys going to her church-[employer] [place] which was started in her mother and father's house.  Niomi denies symptoms of claudication, ischemic rest pain, tissue loss.  She recently had ABIs which demonstrated some arterial disease in the left lower extremity.  Surgical history includes right-sided ankle fracture.  The pt is  on a statin for cholesterol management.  The pt is not daily aspirin.   Other AC:  -   Past Medical History:  Diagnosis Date   Anemia    Arthritis    Blood in urine    Breast cancer (HCC)    Cancer (HCC) 1994   left breast   Diabetes mellitus    Fibroid    GERD (gastroesophageal reflux disease)    Glaucoma    Headache(784.0)    Hyperlipidemia    Hypertension    Hyperthyroidism    Obesity    Osteoporosis    Personal history of chemotherapy    Personal history of radiation therapy    Pituitary tumor    PONV (postoperative nausea and vomiting)    Shortness of breath    Thyroid disease    overactive thyroid    Past Surgical History:  Procedure Laterality Date   ABDOMINAL HYSTERECTOMY     TAH BSO   BREAST LUMPECTOMY Left 1994   with removal of lymph nodes/had chemo and radiation   CHOLECYSTECTOMY     EYE SURGERY     Laser   left wrist surgery     OOPHORECTOMY     BSO   ORIF ANKLE FRACTURE Right 11/12/2013   Procedure: OPEN REDUCTION INTERNAL FIXATION (ORIF) RIGHT ANKLE FRACTURE;  Surgeon: Toni Arthurs, MD;  Location: [facility] OR;  Service: Orthopedics;  Laterality: Right;   Pituitary  tumor removal  1995 & 1999   TOE SURGERY     TUBAL LIGATION     Vein procedure     WRIST FRACTURE SURGERY      Social History   Socioeconomic History   Marital status: Married    Spouse name: Not on file   Number of children: Not on file   Years of education: Not on file   Highest education level: Not on file  Occupational History   Not on file  Tobacco Use   Smoking status: Never   Smokeless tobacco: Never  Vaping Use   Vaping status: Never Used  Substance and Sexual Activity   Alcohol use: No    Alcohol/week: 0.0 standard drinks of alcohol   Drug use: No   Sexual activity: Yes    Birth control/protection: Surgical    Comment: First IC >45 y/o, Partners <5, No hx of STIs, Hx of Breast Cancer in 79'  Other Topics Concern   Not on file  Social History Narrative   Married to [place]   Has one son Francis Dowse   Use to work in Designer, fashion/clothing   Social Determinants of Corporate investment banker Strain: Not on file  Food Insecurity: Not on file  Transportation Needs: Not  on file  Physical Activity: Not on file  Stress: Not on file  Social Connections: Not on file  Intimate Partner Violence: Not on file   Family History  Problem Relation Age of Onset   Breast cancer Mother        Age 17   Hypertension Mother    Heart failure Father    Hypertension Sister    Diabetes Brother    Hypertension Brother    Cancer Brother        Unsure what type    Current Outpatient Medications  Medication Sig Dispense Refill   ACCU-CHEK GUIDE test strip TEST BLOOD SUGAR TWICE DAILY for 90 days     Accu-Chek Softclix Lancets lancets USE TO TEST BLOOD SUGAR TWICE DAILY for 90 days     Acetaminophen (TYLENOL PO) Take by mouth.     amLODipine (NORVASC) 5 MG tablet Take 5 mg by mouth daily.     aspirin EC 81 MG tablet Take 81 mg by mouth daily.     bimatoprost (LUMIGAN) 0.01 % SOLN 1 drop into affected eye every evening Ophthalmic Once a day     brimonidine (ALPHAGAN P) 0.1 % SOLN Place 1 drop  into both eyes 2 (two) times daily.      Calcium Carb-Cholecalciferol 500-2.5 MG-MCG CHEW 1 tablet with a meal Orally Once a day     cholecalciferol (VITAMIN D) 1000 UNITS tablet Take 1,000 Units by mouth daily.     docusate sodium (COLACE) 100 MG capsule Take 1 capsule every day by oral route.     dorzolamide-timolol (COSOPT) 22.3-6.8 MG/ML ophthalmic solution Place 1 drop into both eyes 2 (two) times daily.       Dulaglutide (TRULICITY) 1.5 MG/0.5ML SOPN Inject 1.5 mg every week by subcutaneous route.     ferrous sulfate 325 (65 FE) MG tablet Take 325 mg by mouth daily.     fluconazole (DIFLUCAN) 100 MG tablet Take 1 tablet by mouth daily.     fluticasone (FLONASE) 50 MCG/ACT nasal spray      hydrocortisone (CORTEF) 10 MG tablet Take 10 mg by mouth daily.      JARDIANCE 10 MG TABS tablet Take 10 mg by mouth daily.     latanoprost (XALATAN) 0.005 % ophthalmic solution SMARTSIG:In Eye(s)     levothyroxine (SYNTHROID) 100 MCG tablet Take 100 mcg by mouth daily before breakfast.     meclizine (ANTIVERT) 25 MG tablet TAKE 1 TABLET BY MOUTH EVERY 8 HOURS AS NEEDED FOR DIZZINESS     Multiple Vitamin (MULTI VITAMIN) TABS 1 tablet Orally Once a day     olmesartan (BENICAR) 40 MG tablet Take 40 mg by mouth daily.     olmesartan-hydrochlorothiazide (BENICAR HCT) 40-12.5 MG tablet      Omega-3 Fatty Acids (FISH OIL PO) Take 1 tablet by mouth daily.     omeprazole (PRILOSEC) 20 MG capsule Take 20 mg by mouth daily.     ondansetron (ZOFRAN) 4 MG tablet Take 1 tablet (4 mg total) by mouth every 6 (six) hours. 12 tablet 0   rosuvastatin (CRESTOR) 5 MG tablet TAKE 1 TABLET BY MOUTH TWICE A WEEK     No current facility-administered medications for this visit.    Allergies  Allergen Reactions   Ambien [Zolpidem Tartrate] Nausea And Vomiting   Iodinated Contrast Media Other (See Comments)    Caused Kidney issues after a CT scan   Zolpidem Other (See Comments)   Ciprofloxacin Hcl Rash   Codeine Nausea  And Vomiting     REVIEW OF SYSTEMS:  [X]  denotes positive finding, [ ]  denotes negative finding Cardiac  Comments:  Chest pain or chest pressure:    Shortness of breath upon exertion:    Short of breath when lying flat:    Irregular heart rhythm:        Vascular    Pain in calf, thigh, or hip brought on by ambulation:    Pain in feet at night that wakes you up from your sleep:     Blood clot in your veins:    Leg swelling:         Pulmonary    Oxygen at home:    Productive cough:     Wheezing:         Neurologic    Sudden weakness in arms or legs:     Sudden numbness in arms or legs:     Sudden onset of difficulty speaking or slurred speech:    Temporary loss of vision in one eye:     Problems with dizziness:         Gastrointestinal    Blood in stool:     Vomited blood:         Genitourinary    Burning when urinating:     Blood in urine:        Psychiatric    Major depression:         Hematologic    Bleeding problems:    Problems with blood clotting too easily:        Skin    Rashes or ulcers:        Constitutional    Fever or chills:      PHYSICAL EXAMINATION:  There were no vitals filed for this visit.  General:  WDWN in NAD; vital signs documented above Gait: Not observed HENT: WNL, normocephalic Pulmonary: normal non-labored breathing , without wheezing Cardiac: regular HR Abdomen: soft, NT, no masses Skin: without rashes Vascular Exam/Pulses:  Right Left  Radial 2+ (normal) 2+ (normal)  Ulnar    Femoral    Popliteal    DP 2+ (normal) 2+ (normal)  PT     Extremities: without ischemic changes, without Gangrene , without cellulitis; without open wounds;  Small amount of Lipe edema surrounding the ankles Musculoskeletal: no muscle wasting or atrophy  Neurologic: A&O X 3;  No focal weakness or paresthesias are detected Psychiatric:  The pt has Normal affect.   Non-Invasive Vascular Imaging:   R1.13 /L0.72    ASSESSMENT/PLAN: VALINE DROZDOWSKI is a 77 y.o. female presenting with asymptomatic peripheral arterial disease.  On physical exam, she had a 2+ dorsalis pedis on the right, 1+ on the left.  The ABI numbers I was given demonstrated relatively normal ABI on the right with mild PAD on the left.  We had a long conversation regarding the above. Gabryel is on best medical therapy.  She would benefit from continued ambulation.  She is a non-smoker. My plan is to see her on a yearly basis to assess her distal perfusion.  I am hopeful that she will have minimal progression as she remains active, and is optimized from a medication standpoint.   Recommend the following which can slow the progression of atherosclerosis and reduce the risk of major adverse cardiac / limb events:  Aspirin 81mg  PO QD.  Atorvastatin 40-80mg  PO QD (or other "high intensity" statin therapy). Complete cessation from all tobacco products. Blood glucose control with goal  A1c < 7%. Blood pressure control with goal blood pressure < 140/90 mmHg. Lipid reduction therapy with goal LDL-C <100 mg/dL (<95 if symptomatic from PAD).     Victorino Sparrow, MD [facility] [phone]

## 2023-04-26 ENCOUNTER — Ambulatory Visit: Payer: Medicare Other | Admitting: Vascular Surgery

## 2023-04-26 ENCOUNTER — Encounter: Payer: Self-pay | Admitting: Vascular Surgery

## 2023-04-26 VITALS — HR 66 | Temp 98.0°F | Resp 20 | Ht 64.0 in | Wt 158.0 lb

## 2023-04-26 DIAGNOSIS — R6889 Other general symptoms and signs: Secondary | ICD-10-CM

## 2023-04-26 DIAGNOSIS — I739 Peripheral vascular disease, unspecified: Secondary | ICD-10-CM | POA: Diagnosis not present

## 2023-05-07 DIAGNOSIS — E039 Hypothyroidism, unspecified: Secondary | ICD-10-CM | POA: Diagnosis not present

## 2023-05-07 DIAGNOSIS — E1122 Type 2 diabetes mellitus with diabetic chronic kidney disease: Secondary | ICD-10-CM | POA: Diagnosis not present

## 2023-05-07 DIAGNOSIS — E782 Mixed hyperlipidemia: Secondary | ICD-10-CM | POA: Diagnosis not present

## 2023-05-07 DIAGNOSIS — E559 Vitamin D deficiency, unspecified: Secondary | ICD-10-CM | POA: Diagnosis not present

## 2023-05-13 DIAGNOSIS — E782 Mixed hyperlipidemia: Secondary | ICD-10-CM | POA: Diagnosis not present

## 2023-05-13 DIAGNOSIS — E1122 Type 2 diabetes mellitus with diabetic chronic kidney disease: Secondary | ICD-10-CM | POA: Diagnosis not present

## 2023-05-13 DIAGNOSIS — E559 Vitamin D deficiency, unspecified: Secondary | ICD-10-CM | POA: Diagnosis not present

## 2023-05-13 DIAGNOSIS — E039 Hypothyroidism, unspecified: Secondary | ICD-10-CM | POA: Diagnosis not present

## 2023-05-13 DIAGNOSIS — D509 Iron deficiency anemia, unspecified: Secondary | ICD-10-CM | POA: Diagnosis not present

## 2023-05-13 DIAGNOSIS — E1165 Type 2 diabetes mellitus with hyperglycemia: Secondary | ICD-10-CM | POA: Diagnosis not present

## 2023-05-13 DIAGNOSIS — D631 Anemia in chronic kidney disease: Secondary | ICD-10-CM | POA: Diagnosis not present

## 2023-05-13 DIAGNOSIS — E1139 Type 2 diabetes mellitus with other diabetic ophthalmic complication: Secondary | ICD-10-CM | POA: Diagnosis not present

## 2023-05-13 DIAGNOSIS — I129 Hypertensive chronic kidney disease with stage 1 through stage 4 chronic kidney disease, or unspecified chronic kidney disease: Secondary | ICD-10-CM | POA: Diagnosis not present

## 2023-05-13 DIAGNOSIS — I700 Atherosclerosis of aorta: Secondary | ICD-10-CM | POA: Diagnosis not present

## 2023-05-13 DIAGNOSIS — K219 Gastro-esophageal reflux disease without esophagitis: Secondary | ICD-10-CM | POA: Diagnosis not present

## 2023-05-13 DIAGNOSIS — N184 Chronic kidney disease, stage 4 (severe): Secondary | ICD-10-CM | POA: Diagnosis not present

## 2023-05-21 DIAGNOSIS — N183 Chronic kidney disease, stage 3 unspecified: Secondary | ICD-10-CM | POA: Diagnosis not present

## 2023-05-21 DIAGNOSIS — N2581 Secondary hyperparathyroidism of renal origin: Secondary | ICD-10-CM | POA: Diagnosis not present

## 2023-05-21 DIAGNOSIS — E1122 Type 2 diabetes mellitus with diabetic chronic kidney disease: Secondary | ICD-10-CM | POA: Diagnosis not present

## 2023-05-21 DIAGNOSIS — E785 Hyperlipidemia, unspecified: Secondary | ICD-10-CM | POA: Diagnosis not present

## 2023-05-21 DIAGNOSIS — I129 Hypertensive chronic kidney disease with stage 1 through stage 4 chronic kidney disease, or unspecified chronic kidney disease: Secondary | ICD-10-CM | POA: Diagnosis not present

## 2023-05-21 DIAGNOSIS — D631 Anemia in chronic kidney disease: Secondary | ICD-10-CM | POA: Diagnosis not present

## 2023-06-10 ENCOUNTER — Other Ambulatory Visit: Payer: Self-pay | Admitting: Nurse Practitioner

## 2023-06-10 DIAGNOSIS — Z1231 Encounter for screening mammogram for malignant neoplasm of breast: Secondary | ICD-10-CM

## 2023-06-17 DIAGNOSIS — Z23 Encounter for immunization: Secondary | ICD-10-CM | POA: Diagnosis not present

## 2023-06-28 ENCOUNTER — Telehealth: Payer: Self-pay | Admitting: Pharmacist

## 2023-06-28 NOTE — Progress Notes (Signed)
06/28/2023  Patient ID: Alexis Singh, female   DOB: 08-05-1946, 77 y.o.   MRN: [id number]   Reason for referral: Medication Assistance  Referral source: [facility] Patient Assistance List Referral medication(s): Trulicity Current insurance:[employer]   Medications Reviewed Today     Reviewed by Beecher Mcardle, [facility] (Pharmacist) on 06/28/23 at 1453  Med List Status: <None>   Medication Order Taking? Sig Documenting Provider Last Dose Status Informant  ACCU-CHEK GUIDE test strip [id number] Yes TEST BLOOD SUGAR TWICE DAILY for 90 days [provider] Taking Active   Accu-Chek Softclix Lancets lancets [id number] Yes USE TO TEST BLOOD SUGAR TWICE DAILY for 90 days [provider] Taking Active   Acetaminophen (TYLENOL PO) [id number] Yes Take by mouth. [provider] Taking Active   amLODipine (NORVASC) 5 MG tablet [id number] Yes Take 5 mg by mouth daily. [provider] Taking Active   aspirin EC 81 MG tablet [id number] Yes Take 81 mg by mouth daily. [provider] Taking Active   azelastine (ASTELIN) 0.1 % nasal spray [id number] Yes Place 2 sprays into both nostrils 2 (two) times daily as needed. [provider] Taking Active   bimatoprost (LUMIGAN) 0.01 % SOLN [id number] No 1 drop into affected eye every evening Ophthalmic Once a day [provider] Unknown Active   brimonidine (ALPHAGAN P) 0.1 % SOLN [id number] Yes Place 1 drop into both eyes 2 (two) times daily.  [provider] Taking Active Self  Calcium Carb-Cholecalciferol 500-2.5 MG-MCG CHEW [id number] No 1 tablet with a meal Orally Once a day [provider] Unknown Active   cholecalciferol (VITAMIN D) 1000 UNITS tablet [id number] Yes Take 1,000 Units by mouth daily. [provider] Taking Active Self  docusate sodium (COLACE) 100 MG capsule [id number] No Take 1 capsule every day by oral route. [provider] Unknown Active    dorzolamide-timolol (COSOPT) 22.3-6.8 MG/ML ophthalmic solution [id number] No Place 1 drop into both eyes 2 (two) times daily.   [provider] Unknown Active Self  Dulaglutide (TRULICITY) 1.5 MG/0.5ML SOPN [id number] Yes Inject 1.5 mg every week by subcutaneous route. [provider] Taking Active   ferrous sulfate 325 (65 FE) MG tablet [id number] Yes Take 325 mg by mouth daily. [provider] Taking Active Self  fluconazole (DIFLUCAN) 100 MG tablet [id number] No Take 1 tablet by mouth daily. [provider] Unknown Active   fluticasone (FLONASE) 50 MCG/ACT nasal spray [id number] No  [provider] Unknown Active   hydrocortisone (CORTEF) 10 MG tablet [id number] No Take 10 mg by mouth daily.  [provider] Unknown Active Self           Med Note Karilyn Cota, Jacklynn Ganong Oct 15, 2016 10:58 AM) Patient reports taking 1 tab in am and 1/2 tab in the evening.  JARDIANCE 10 MG TABS tablet [id number] Yes Take 10 mg by mouth daily. [provider] Taking Active   latanoprost (XALATAN) 0.005 % ophthalmic solution [id number] Yes SMARTSIG:In Eye(s) [provider] Taking Active   levothyroxine (SYNTHROID) 75 MCG tablet [id number] Yes Take 100 mcg by mouth daily before breakfast. [provider] Taking Active   meclizine (ANTIVERT) 25 MG tablet [id number] Yes TAKE 1 TABLET BY MOUTH EVERY 8 HOURS AS NEEDED FOR DIZZINESS [provider] Taking Active   Multiple Vitamin (MULTI VITAMIN) TABS [id number] Yes 1 tablet Orally Once a day [provider] Taking Active   olmesartan (BENICAR) 40 MG tablet [id number] Yes Take  40 mg by mouth daily. [provider] Taking Active   olmesartan-hydrochlorothiazide (BENICAR HCT) 40-12.5 MG tablet [id number]   [provider]  Active   Omega-3 Fatty Acids (FISH OIL PO) [id number] Yes Take 1 tablet by mouth daily. [provider] Taking Active   omeprazole (PRILOSEC) 20  MG capsule [id number] Yes Take 20 mg by mouth daily. [provider] Taking Active   ondansetron (ZOFRAN) 4 MG tablet [id number] No Take 1 tablet (4 mg total) by mouth every 6 (six) hours. Franne Forts, DO Unknown Active   rosuvastatin (CRESTOR) 5 MG tablet [id number] Yes TAKE 1 TABLET BY MOUTH TWICE A WEEK [provider] Taking Active              Medication Assistance Findings:  Medication assistance needs identified: Patient confirmed receiving Trulicity through [employer] Patient Assistance program last year.  HIPAA identifiers were obtained.  She was over income for Jardiance.  Application letter will be sent.     Additional medication assistance options reviewed with patient as warranted:  No other options identified  Plan: I will route patient assistance letter to [facility] pharmacy technician who will coordinate patient assistance program application process for medications listed above.  [facility] pharmacy technician will assist with obtaining all required documents from both patient and provider(s) and submit application(s) once completed.     Beecher Mcardle, PharmD, BCACP [facility] Clinical Pharmacist [phone]

## 2023-07-12 ENCOUNTER — Telehealth: Payer: Self-pay | Admitting: Pharmacy Technician

## 2023-07-12 DIAGNOSIS — Z5986 Financial insecurity: Secondary | ICD-10-CM

## 2023-07-12 NOTE — Progress Notes (Signed)
[facility] Customer service manager [facility])                                            [facility] Quality Pharmacy Team    07/12/2023  JEARLINE HIRSCHHORN 1946/03/05 [id number]                                      Medication Assistance Referral  Referral From: [facility] Katina B.  Medication/Company: Leana Roe Patient application portion:  Mailed Provider application portion: Faxed  to Dr. Fidel Levy Provider address/fax verified via: Office website  [name], CPhT [place]  Office: [phone] Fax: [phone] Email: [email or web address]

## 2023-07-18 DIAGNOSIS — H2513 Age-related nuclear cataract, bilateral: Secondary | ICD-10-CM | POA: Diagnosis not present

## 2023-07-26 DIAGNOSIS — E119 Type 2 diabetes mellitus without complications: Secondary | ICD-10-CM | POA: Diagnosis not present

## 2023-07-26 DIAGNOSIS — M8588 Other specified disorders of bone density and structure, other site: Secondary | ICD-10-CM | POA: Diagnosis not present

## 2023-07-26 DIAGNOSIS — Z8781 Personal history of (healed) traumatic fracture: Secondary | ICD-10-CM | POA: Diagnosis not present

## 2023-07-26 DIAGNOSIS — E893 Postprocedural hypopituitarism: Secondary | ICD-10-CM | POA: Diagnosis not present

## 2023-07-30 DIAGNOSIS — Z539 Procedure and treatment not carried out, unspecified reason: Secondary | ICD-10-CM | POA: Diagnosis not present

## 2023-07-30 DIAGNOSIS — H401123 Primary open-angle glaucoma, left eye, severe stage: Secondary | ICD-10-CM | POA: Diagnosis not present

## 2023-07-30 DIAGNOSIS — E1136 Type 2 diabetes mellitus with diabetic cataract: Secondary | ICD-10-CM | POA: Diagnosis not present

## 2023-08-01 ENCOUNTER — Telehealth: Payer: Self-pay | Admitting: Pharmacy Technician

## 2023-08-01 DIAGNOSIS — Z5986 Financial insecurity: Secondary | ICD-10-CM

## 2023-08-01 NOTE — Progress Notes (Signed)
[facility])                                            [facility] Quality Pharmacy Team    08/01/2023  Alexis Singh [birth year] [id number]  Received both patient and provider portion(s) of patient assistance application(s) for Trulicity. Faxed completed application and required documents into [employer].    [name], CPhT [name]  Office: [phone] Fax: [phone] Email: [name]

## 2023-08-09 ENCOUNTER — Telehealth: Payer: Self-pay | Admitting: Pharmacy Technician

## 2023-08-09 DIAGNOSIS — Z5986 Financial insecurity: Secondary | ICD-10-CM

## 2023-08-09 NOTE — Progress Notes (Signed)
[facility])                                            [facility] Quality Pharmacy Team    08/09/2023  DONYELLE HOEN 12/01/1945 [id number]  Care coordination call placed to [employer] in regard to re enrollment application for Trulicity in 2025.  Spoke to [place] at [facility] who informs patient is APPROVED 10/02/23-09/30/24. She will transfer to pharmacy to ask when last filled in 2024 and when to expect first shipment in 2025 as she does not show any shipments for 2024. Spoke to [place] at [facility] and she informs patient has been approved for all of 2024 and got approved for the calendar year 2025 on 10/31.24  Spoke to [place] at [facility] who informs a shipment will start processing today which will be enough of a supply to get patient thru 2024. The 2025 shipments will process once they receive the paperwork from [employer] and then they will be processed automatically and be delivered to her home address on file.  Unsuccessful outreach to patient. HIPAA compliant v/m left requesting a return call. Was calling to inform of the above information.  [name], CPhT [place]  Office: [phone] Fax: [phone] Email: [name]

## 2023-08-19 ENCOUNTER — Ambulatory Visit
Admission: RE | Admit: 2023-08-19 | Discharge: 2023-08-19 | Disposition: A | Discharge status: Home / Self Care | Payer: Medicare Other | Source: Ambulatory Visit | Attending: Nurse Practitioner

## 2023-08-19 DIAGNOSIS — Z1231 Encounter for screening mammogram for malignant neoplasm of breast: Secondary | ICD-10-CM

## 2023-08-20 DIAGNOSIS — H401123 Primary open-angle glaucoma, left eye, severe stage: Secondary | ICD-10-CM | POA: Diagnosis not present

## 2023-08-20 DIAGNOSIS — I10 Essential (primary) hypertension: Secondary | ICD-10-CM | POA: Diagnosis not present

## 2023-08-20 DIAGNOSIS — Z853 Personal history of malignant neoplasm of breast: Secondary | ICD-10-CM | POA: Diagnosis not present

## 2023-08-20 DIAGNOSIS — E039 Hypothyroidism, unspecified: Secondary | ICD-10-CM | POA: Diagnosis not present

## 2023-08-20 DIAGNOSIS — H269 Unspecified cataract: Secondary | ICD-10-CM | POA: Diagnosis not present

## 2023-08-20 DIAGNOSIS — Z7984 Long term (current) use of oral hypoglycemic drugs: Secondary | ICD-10-CM | POA: Diagnosis not present

## 2023-08-20 DIAGNOSIS — Z7982 Long term (current) use of aspirin: Secondary | ICD-10-CM | POA: Diagnosis not present

## 2023-08-20 DIAGNOSIS — E1136 Type 2 diabetes mellitus with diabetic cataract: Secondary | ICD-10-CM | POA: Diagnosis not present

## 2023-08-20 DIAGNOSIS — Z79899 Other long term (current) drug therapy: Secondary | ICD-10-CM | POA: Diagnosis not present

## 2023-08-20 DIAGNOSIS — H25812 Combined forms of age-related cataract, left eye: Secondary | ICD-10-CM | POA: Diagnosis not present

## 2023-08-20 DIAGNOSIS — Z7985 Long-term (current) use of injectable non-insulin antidiabetic drugs: Secondary | ICD-10-CM | POA: Diagnosis not present

## 2023-08-21 DIAGNOSIS — H401133 Primary open-angle glaucoma, bilateral, severe stage: Secondary | ICD-10-CM | POA: Diagnosis not present

## 2023-09-11 ENCOUNTER — Ambulatory Visit (INDEPENDENT_AMBULATORY_CARE_PROVIDER_SITE_OTHER): Payer: Medicare Other | Admitting: Nurse Practitioner

## 2023-09-11 ENCOUNTER — Encounter: Payer: Self-pay | Admitting: Nurse Practitioner

## 2023-09-11 VITALS — BP 142/80 | HR 69 | Ht 64.0 in | Wt 154.0 lb

## 2023-09-11 DIAGNOSIS — Z01419 Encounter for gynecological examination (general) (routine) without abnormal findings: Secondary | ICD-10-CM | POA: Diagnosis not present

## 2023-09-11 DIAGNOSIS — Z780 Asymptomatic menopausal state: Secondary | ICD-10-CM

## 2023-09-11 DIAGNOSIS — M810 Age-related osteoporosis without current pathological fracture: Secondary | ICD-10-CM

## 2023-09-11 NOTE — Progress Notes (Signed)
Alexis Singh October 07, 1945 [id number]   History:  77 y.o. G2P1011 presents for breast and pelvic exam without GYN complaints. S/P 1990 TAH BSO for fibroids, no HRT. Normal pap history. 1994 left breast cancer treated with lumpectomy, radiation, and chemotherapy. Osteoporosis managed by endocrinology, did not tolerate fosamax and Prolia was too expensive. H/O CKD4, DM, HTN, HLD, hypothyroidism.   Gynecologic History No LMP recorded. Patient has had a hysterectomy.   Contraception: status post hysterectomy Sexually active: No  Health maintenance Last Pap: No longer screening per guidelines Last mammogram: 08/19/2023. Results were: Normal Last colonoscopy: 06/2010. Results were: hemorrhoids, 10-year recall recommended Last Dexa: 05/02/2022. Results were: T-score -1.9  Past medical history, past surgical history, family history and social history were all reviewed and documented in the EPIC chart. Married. Has 1 son, 3 grandkids and 2 great grandkids.   ROS:  A ROS was performed and pertinent positives and negatives are included.  Exam:  Vitals:   09/11/23 1439  BP: (!) 142/80  Pulse: 69  SpO2: 99%  Weight: 154 lb (69.9 kg)  Height: 5\' 4"  (1.626 m)     Body mass index is 26.43 kg/m.  General appearance:  Normal Thyroid:  Symmetrical, normal in size, without palpable masses or nodularity. Respiratory  Auscultation:  Clear without wheezing or rhonchi Cardiovascular  Auscultation:  Regular rate, without rubs, murmurs or gallops  Edema/varicosities:  Not grossly evident Abdominal  Soft,nontender, without masses, guarding or rebound.  Liver/spleen:  No organomegaly noted  Hernia:  None appreciated  Skin  Inspection:  Grossly normal Breasts: Examined lying and sitting.   Right: Without masses, retractions, nipple discharge or axillary adenopathy.   Left: Without masses, retractions, nipple discharge or axillary adenopathy. Pelvic: External genitalia:  no lesions               Urethra:  normal appearing urethra with no masses, tenderness or lesions              Bartholins and Skenes: normal                 Vagina: normal appearing vagina with normal color and discharge, no lesions. Atrophic changes              Cervix: absent Bimanual Exam:  Uterus:  absent              Adnexa: no mass, fullness, tenderness              Rectovaginal: Deferred              Anus:  normal, no lesions   Patient informed chaperone available to be present for breast and pelvic exam. Patient has requested no chaperone to be present. Patient has been advised what will be completed during breast and pelvic exam.   Assessment/Plan:  77 y.o. G2P1011 for annual exam.   Encounter for breast and pelvic examination - Education provided on SBEs, importance of preventative screenings, current guidelines, high calcium diet, regular exercise, and multivitamin daily. Labs with PCP and endocrinology.   Postmenopausal - no HRT  Age-related osteoporosis without current pathological fracture - T-score -1.9 in August 2023. Exercises as tolerated, continue Vitamin D + Calcium. Endocrinology manages. Did not tolerate Fosamax and Prolia was too expensive.  Screening for cervical cancer - Normal Pap history. No longer screening per guidelines.  Screening for breast cancer - Normal breast exam today. Left breast cancer in 1994. UTD on mammogram.   Screening for colon cancer - Overdue  for screening colonscopy. Discussed importance of preventative screenings. She plans to schedule this soon.   Return in about 2 years (around 09/10/2025) for B&P.     Olivia Mackie [facility], 2:55 PM 09/11/2023

## 2023-09-12 DIAGNOSIS — H401133 Primary open-angle glaucoma, bilateral, severe stage: Secondary | ICD-10-CM | POA: Diagnosis not present

## 2023-10-08 DIAGNOSIS — E559 Vitamin D deficiency, unspecified: Secondary | ICD-10-CM | POA: Diagnosis not present

## 2023-10-08 DIAGNOSIS — E1122 Type 2 diabetes mellitus with diabetic chronic kidney disease: Secondary | ICD-10-CM | POA: Diagnosis not present

## 2023-10-08 DIAGNOSIS — E782 Mixed hyperlipidemia: Secondary | ICD-10-CM | POA: Diagnosis not present

## 2023-10-08 DIAGNOSIS — E039 Hypothyroidism, unspecified: Secondary | ICD-10-CM | POA: Diagnosis not present

## 2023-10-14 DIAGNOSIS — K219 Gastro-esophageal reflux disease without esophagitis: Secondary | ICD-10-CM | POA: Diagnosis not present

## 2023-10-14 DIAGNOSIS — I700 Atherosclerosis of aorta: Secondary | ICD-10-CM | POA: Diagnosis not present

## 2023-10-14 DIAGNOSIS — E039 Hypothyroidism, unspecified: Secondary | ICD-10-CM | POA: Diagnosis not present

## 2023-10-14 DIAGNOSIS — I129 Hypertensive chronic kidney disease with stage 1 through stage 4 chronic kidney disease, or unspecified chronic kidney disease: Secondary | ICD-10-CM | POA: Diagnosis not present

## 2023-10-14 DIAGNOSIS — M170 Bilateral primary osteoarthritis of knee: Secondary | ICD-10-CM | POA: Diagnosis not present

## 2023-10-14 DIAGNOSIS — H4010X0 Unspecified open-angle glaucoma, stage unspecified: Secondary | ICD-10-CM | POA: Diagnosis not present

## 2023-10-14 DIAGNOSIS — E1122 Type 2 diabetes mellitus with diabetic chronic kidney disease: Secondary | ICD-10-CM | POA: Diagnosis not present

## 2023-10-14 DIAGNOSIS — E782 Mixed hyperlipidemia: Secondary | ICD-10-CM | POA: Diagnosis not present

## 2023-10-14 DIAGNOSIS — D509 Iron deficiency anemia, unspecified: Secondary | ICD-10-CM | POA: Diagnosis not present

## 2023-10-14 DIAGNOSIS — N1832 Chronic kidney disease, stage 3b: Secondary | ICD-10-CM | POA: Diagnosis not present

## 2023-10-14 DIAGNOSIS — N184 Chronic kidney disease, stage 4 (severe): Secondary | ICD-10-CM | POA: Diagnosis not present

## 2023-10-14 DIAGNOSIS — E559 Vitamin D deficiency, unspecified: Secondary | ICD-10-CM | POA: Diagnosis not present

## 2023-11-21 DIAGNOSIS — R0981 Nasal congestion: Secondary | ICD-10-CM | POA: Diagnosis not present

## 2023-11-21 DIAGNOSIS — R059 Cough, unspecified: Secondary | ICD-10-CM | POA: Diagnosis not present

## 2023-11-21 DIAGNOSIS — R030 Elevated blood-pressure reading, without diagnosis of hypertension: Secondary | ICD-10-CM | POA: Diagnosis not present

## 2023-11-27 DIAGNOSIS — H401123 Primary open-angle glaucoma, left eye, severe stage: Secondary | ICD-10-CM | POA: Diagnosis not present

## 2023-12-02 DIAGNOSIS — M25511 Pain in right shoulder: Secondary | ICD-10-CM | POA: Diagnosis not present

## 2023-12-04 DIAGNOSIS — H26492 Other secondary cataract, left eye: Secondary | ICD-10-CM | POA: Diagnosis not present

## 2023-12-10 DIAGNOSIS — M7541 Impingement syndrome of right shoulder: Secondary | ICD-10-CM | POA: Diagnosis not present

## 2023-12-26 DIAGNOSIS — E1122 Type 2 diabetes mellitus with diabetic chronic kidney disease: Secondary | ICD-10-CM | POA: Diagnosis not present

## 2023-12-26 DIAGNOSIS — E785 Hyperlipidemia, unspecified: Secondary | ICD-10-CM | POA: Diagnosis not present

## 2023-12-26 DIAGNOSIS — D631 Anemia in chronic kidney disease: Secondary | ICD-10-CM | POA: Diagnosis not present

## 2023-12-26 DIAGNOSIS — N183 Chronic kidney disease, stage 3 unspecified: Secondary | ICD-10-CM | POA: Diagnosis not present

## 2023-12-26 DIAGNOSIS — N2581 Secondary hyperparathyroidism of renal origin: Secondary | ICD-10-CM | POA: Diagnosis not present

## 2023-12-26 DIAGNOSIS — I129 Hypertensive chronic kidney disease with stage 1 through stage 4 chronic kidney disease, or unspecified chronic kidney disease: Secondary | ICD-10-CM | POA: Diagnosis not present

## 2024-01-04 DIAGNOSIS — M25511 Pain in right shoulder: Secondary | ICD-10-CM | POA: Diagnosis not present

## 2024-01-09 DIAGNOSIS — M19111 Post-traumatic osteoarthritis, right shoulder: Secondary | ICD-10-CM | POA: Diagnosis not present

## 2024-01-13 DIAGNOSIS — H401123 Primary open-angle glaucoma, left eye, severe stage: Secondary | ICD-10-CM | POA: Diagnosis not present

## 2024-01-15 ENCOUNTER — Telehealth: Payer: Self-pay

## 2024-01-15 NOTE — Progress Notes (Signed)
   01/15/2024  Patient ID: Alexis Singh, female   DOB: Mar 18, 1946, 78 y.o.   MRN: [id number]   Patient appeared on insurance report for not passing the quality metrics in 2024:  Medication Adherence for Diabetes (MAD)   Outreach to the patient was not needed today.  Meds Tracking:  -Olmesartan 40 mg - Last filled 90DS on 11/18/23, BP 122/74 on 10/14/23. Does not qualify for metric yet this year, next fill due 02/16/24.  -Rosuvastatin 5 mg - Last filled 90DS on 11/18/23, LDL 77 on 10/14/23. Does not qualify for metric yet this year, next fill due 02/16/24.  Plan:  Now getting Trulicity and Jardiance via PAP so will not qualify for MAD this year. Has been adherent to HTN and HLD meds, will still track adherence for the year. Next fill history review on 02/19/24   Flint Hummer, PharmD

## 2024-01-23 DIAGNOSIS — M8588 Other specified disorders of bone density and structure, other site: Secondary | ICD-10-CM | POA: Diagnosis not present

## 2024-01-23 DIAGNOSIS — Z8781 Personal history of (healed) traumatic fracture: Secondary | ICD-10-CM | POA: Diagnosis not present

## 2024-01-23 DIAGNOSIS — G4700 Insomnia, unspecified: Secondary | ICD-10-CM | POA: Diagnosis not present

## 2024-01-23 DIAGNOSIS — R5383 Other fatigue: Secondary | ICD-10-CM | POA: Diagnosis not present

## 2024-01-23 DIAGNOSIS — E893 Postprocedural hypopituitarism: Secondary | ICD-10-CM | POA: Diagnosis not present

## 2024-01-23 DIAGNOSIS — E119 Type 2 diabetes mellitus without complications: Secondary | ICD-10-CM | POA: Diagnosis not present

## 2024-02-03 DIAGNOSIS — H401123 Primary open-angle glaucoma, left eye, severe stage: Secondary | ICD-10-CM | POA: Diagnosis not present

## 2024-02-19 ENCOUNTER — Telehealth: Payer: Self-pay

## 2024-02-19 NOTE — Progress Notes (Signed)
   02/19/2024  Patient ID: Graig Lawyer, female   DOB: 31-Aug-1946, 78 y.o.   MRN: [id number]  Adherence Monitoring:  Up to date on meds, documentation in [employer].   Flint Hummer, PharmD

## 2024-02-25 DIAGNOSIS — H2511 Age-related nuclear cataract, right eye: Secondary | ICD-10-CM | POA: Diagnosis not present

## 2024-02-25 DIAGNOSIS — H3581 Retinal edema: Secondary | ICD-10-CM | POA: Diagnosis not present

## 2024-02-25 DIAGNOSIS — H30033 Focal chorioretinal inflammation, peripheral, bilateral: Secondary | ICD-10-CM | POA: Diagnosis not present

## 2024-02-25 DIAGNOSIS — Z9842 Cataract extraction status, left eye: Secondary | ICD-10-CM | POA: Diagnosis not present

## 2024-02-25 DIAGNOSIS — H401123 Primary open-angle glaucoma, left eye, severe stage: Secondary | ICD-10-CM | POA: Diagnosis not present

## 2024-02-25 DIAGNOSIS — Z961 Presence of intraocular lens: Secondary | ICD-10-CM | POA: Diagnosis not present

## 2024-03-09 ENCOUNTER — Telehealth: Payer: Self-pay

## 2024-03-09 NOTE — Telephone Encounter (Signed)
 Adherence Monitoring:  Up to date on meds, next review in August

## 2024-03-16 DIAGNOSIS — H401123 Primary open-angle glaucoma, left eye, severe stage: Secondary | ICD-10-CM | POA: Diagnosis not present

## 2024-04-07 DIAGNOSIS — E782 Mixed hyperlipidemia: Secondary | ICD-10-CM | POA: Diagnosis not present

## 2024-04-07 DIAGNOSIS — E1122 Type 2 diabetes mellitus with diabetic chronic kidney disease: Secondary | ICD-10-CM | POA: Diagnosis not present

## 2024-04-07 DIAGNOSIS — E559 Vitamin D deficiency, unspecified: Secondary | ICD-10-CM | POA: Diagnosis not present

## 2024-04-07 DIAGNOSIS — E039 Hypothyroidism, unspecified: Secondary | ICD-10-CM | POA: Diagnosis not present

## 2024-04-13 DIAGNOSIS — M810 Age-related osteoporosis without current pathological fracture: Secondary | ICD-10-CM | POA: Diagnosis not present

## 2024-04-13 DIAGNOSIS — E039 Hypothyroidism, unspecified: Secondary | ICD-10-CM | POA: Diagnosis not present

## 2024-04-13 DIAGNOSIS — E782 Mixed hyperlipidemia: Secondary | ICD-10-CM | POA: Diagnosis not present

## 2024-04-13 DIAGNOSIS — E1122 Type 2 diabetes mellitus with diabetic chronic kidney disease: Secondary | ICD-10-CM | POA: Diagnosis not present

## 2024-04-13 DIAGNOSIS — H4010X0 Unspecified open-angle glaucoma, stage unspecified: Secondary | ICD-10-CM | POA: Diagnosis not present

## 2024-04-13 DIAGNOSIS — M170 Bilateral primary osteoarthritis of knee: Secondary | ICD-10-CM | POA: Diagnosis not present

## 2024-04-13 DIAGNOSIS — D509 Iron deficiency anemia, unspecified: Secondary | ICD-10-CM | POA: Diagnosis not present

## 2024-04-13 DIAGNOSIS — I700 Atherosclerosis of aorta: Secondary | ICD-10-CM | POA: Diagnosis not present

## 2024-04-13 DIAGNOSIS — K219 Gastro-esophageal reflux disease without esophagitis: Secondary | ICD-10-CM | POA: Diagnosis not present

## 2024-04-13 DIAGNOSIS — N1832 Chronic kidney disease, stage 3b: Secondary | ICD-10-CM | POA: Diagnosis not present

## 2024-04-13 DIAGNOSIS — E559 Vitamin D deficiency, unspecified: Secondary | ICD-10-CM | POA: Diagnosis not present

## 2024-04-13 DIAGNOSIS — I129 Hypertensive chronic kidney disease with stage 1 through stage 4 chronic kidney disease, or unspecified chronic kidney disease: Secondary | ICD-10-CM | POA: Diagnosis not present

## 2024-04-14 ENCOUNTER — Other Ambulatory Visit (HOSPITAL_COMMUNITY): Payer: Self-pay | Admitting: Internal Medicine

## 2024-04-14 DIAGNOSIS — M810 Age-related osteoporosis without current pathological fracture: Secondary | ICD-10-CM

## 2024-04-21 DIAGNOSIS — H3581 Retinal edema: Secondary | ICD-10-CM | POA: Diagnosis not present

## 2024-04-21 DIAGNOSIS — Z961 Presence of intraocular lens: Secondary | ICD-10-CM | POA: Diagnosis not present

## 2024-04-21 DIAGNOSIS — H401123 Primary open-angle glaucoma, left eye, severe stage: Secondary | ICD-10-CM | POA: Diagnosis not present

## 2024-04-21 DIAGNOSIS — H2511 Age-related nuclear cataract, right eye: Secondary | ICD-10-CM | POA: Diagnosis not present

## 2024-04-21 DIAGNOSIS — H30033 Focal chorioretinal inflammation, peripheral, bilateral: Secondary | ICD-10-CM | POA: Diagnosis not present

## 2024-04-21 DIAGNOSIS — Z79899 Other long term (current) drug therapy: Secondary | ICD-10-CM | POA: Diagnosis not present

## 2024-04-21 DIAGNOSIS — Z9842 Cataract extraction status, left eye: Secondary | ICD-10-CM | POA: Diagnosis not present

## 2024-05-04 DIAGNOSIS — H401123 Primary open-angle glaucoma, left eye, severe stage: Secondary | ICD-10-CM | POA: Diagnosis not present

## 2024-05-06 ENCOUNTER — Ambulatory Visit (HOSPITAL_COMMUNITY)
Admission: RE | Admit: 2024-05-06 | Discharge: 2024-05-06 | Disposition: A | Discharge status: Home / Self Care | Source: Ambulatory Visit | Attending: Internal Medicine | Admitting: Internal Medicine

## 2024-05-06 DIAGNOSIS — M8589 Other specified disorders of bone density and structure, multiple sites: Secondary | ICD-10-CM | POA: Diagnosis not present

## 2024-05-06 DIAGNOSIS — M810 Age-related osteoporosis without current pathological fracture: Secondary | ICD-10-CM | POA: Insufficient documentation

## 2024-05-06 DIAGNOSIS — Z780 Asymptomatic menopausal state: Secondary | ICD-10-CM | POA: Diagnosis not present

## 2024-05-19 NOTE — Progress Notes (Signed)
 Surgery orders requested via Epic inbox.

## 2024-05-21 NOTE — H&P (Signed)
 Patient's anticipated LOS is less than 2 midnights, meeting these requirements: - Younger than 60 - Lives within 1 hour of care - Has a competent adult at home to recover with post-op recover - NO history of  - Chronic pain requiring opiods  - Diabetes  - Coronary Artery Disease  - Heart failure  - Heart attack  - Stroke  - DVT/VTE  - Cardiac arrhythmia  - Respiratory Failure/COPD  - Renal failure  - Anemia  - Advanced Liver disease     Alexis Singh is an 78 y.o. female.    Chief Complaint: right shoulder pain  HPI: Pt is a 78 y.o. female complaining of right shoulder pain for multiple years. Pain had continually increased since the beginning. X-rays in the clinic show end-stage arthritic changes of the right shoulder. Pt has tried various conservative treatments which have failed to alleviate their symptoms, including injections and therapy. Various options are discussed with the patient. Risks, benefits and expectations were discussed with the patient. Patient understand the risks, benefits and expectations and wishes to proceed with surgery.   PCP:  Shona Norleen PEDLAR, MD  D/C Plans: Home  PMH: Past Medical History:  Diagnosis Date   Anemia    Arthritis    Blood in urine    Breast cancer (HCC)    Cancer (HCC) 1994   left breast   Diabetes mellitus    Fibroid    GERD (gastroesophageal reflux disease)    Glaucoma    Headache(784.0)    Hyperlipidemia    Hypertension    Hyperthyroidism    Obesity    Osteoporosis    Personal history of chemotherapy    Personal history of radiation therapy    Pituitary tumor    PONV (postoperative nausea and vomiting)    Shortness of breath    Thyroid  disease    overactive thyroid     PSH: Past Surgical History:  Procedure Laterality Date   ABDOMINAL HYSTERECTOMY     TAH BSO   BREAST LUMPECTOMY Left 1994   with removal of lymph nodes/had chemo and radiation   CATARACT EXTRACTION     CHOLECYSTECTOMY     EYE SURGERY      Laser   left wrist surgery     OOPHORECTOMY     BSO   ORIF ANKLE FRACTURE Right 11/12/2013   Procedure: OPEN REDUCTION INTERNAL FIXATION (ORIF) RIGHT ANKLE FRACTURE;  Surgeon: Norleen Armor, MD;  Location: [facility] OR;  Service: Orthopedics;  Laterality: Right;   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 She has never used smokeless tobacco. She reports that she does not drink alcohol and does not use drugs. BMI: Estimated body mass index is 26.43 kg/m as calculated from the following:   Height as of 09/11/23: 5' 4 (1.626 m).   Weight as of 09/11/23: 69.9 kg.  Lab Results  Component Value Date   ALBUMIN 3.8 12/26/2013   Diabetes:   Patient has a diagnosis of diabetes,  Lab Results  Component Value Date   HGBA1C  05/21/2009    5.8 (NOTE) The ADA recommends the following therapeutic goal for glycemic control related to Hgb A1c measurement: Goal of therapy: <6.5 Hgb A1c  Reference: American Diabetes Association: Clinical Practice Recommendations 2010, Diabetes Care, 2010, 33: (Suppl  1).   Smoking Status:   reports that she has never smoked. She has never used smokeless tobacco.    Allergies:  Allergies  Allergen Reactions   Alendronate Sodium     Other Reaction(s): GI Intolerance, upset stomach, nausea   Ambien [Zolpidem Tartrate] Nausea And Vomiting   Iodinated Contrast Media Other (See Comments)    Caused Kidney issues after a CT scan  Other Reaction(s): stomach upset/nausea/vomiting   Zolpidem Other (See Comments)   Ciprofloxacin Hcl Rash   Codeine Nausea And Vomiting    Medications: No current facility-administered medications for this encounter.   Current Outpatient Medications  Medication Sig Dispense Refill   ACCU-CHEK GUIDE test strip TEST BLOOD SUGAR TWICE DAILY for 90 days     Accu-Chek Softclix Lancets lancets USE TO TEST BLOOD  SUGAR TWICE DAILY for 90 days     Acetaminophen  (TYLENOL  PO) Take by mouth.     amLODipine (NORVASC) 5 MG tablet Take 5 mg by mouth daily.     aspirin  EC 81 MG tablet Take 81 mg by mouth daily.     bimatoprost (LUMIGAN) 0.01 % SOLN 1 drop into affected eye every evening Ophthalmic Once a day     brimonidine (ALPHAGAN P) 0.1 % SOLN Place 1 drop into both eyes 2 (two) times daily.      Calcium  Carb-Cholecalciferol 500-2.5 MG-MCG CHEW 1 tablet with a meal Orally Once a day     cholecalciferol (VITAMIN D) 1000 UNITS tablet Take 1,000 Units by mouth daily.     docusate sodium  (COLACE) 100 MG capsule Take 1 capsule every day by oral route.     dorzolamide -timolol  (COSOPT ) 22.3-6.8 MG/ML ophthalmic solution Place 1 drop into both eyes 2 (two) times daily.       Dulaglutide (TRULICITY) 1.5 MG/0.5ML SOPN Inject 1.5 mg every week by subcutaneous route.     ferrous sulfate  325 (65 FE) MG tablet Take 325 mg by mouth daily.     JARDIANCE 10 MG TABS tablet Take 10 mg by mouth daily.     latanoprost  (XALATAN ) 0.005 % ophthalmic solution SMARTSIG:In Eye(s)     levothyroxine  (SYNTHROID ) 75 MCG tablet Take 100 mcg by mouth daily before breakfast.     Multiple Vitamin (MULTI VITAMIN) TABS 1 tablet Orally Once a day     olmesartan (BENICAR) 40 MG tablet Take 40 mg by mouth daily.     Omega-3 Fatty Acids (FISH OIL PO) Take 1 tablet by mouth daily.     omeprazole (PRILOSEC) 20 MG capsule Take 20 mg by mouth daily.     ondansetron  (ZOFRAN ) 4 MG tablet Take 1 tablet (4 mg total) by mouth every 6 (six) hours. 12 tablet 0   prednisoLONE acetate (PRED FORTE) 1 % ophthalmic suspension Apply to eye.     rosuvastatin (CRESTOR) 5 MG tablet TAKE 1 TABLET BY MOUTH TWICE A WEEK      No results found for this or any previous visit (from the past 48 hours). No results found.  ROS: Pain with rom of the right upper extremity  Physical Exam: Alert and oriented 78 y.o. female in no acute distress Cranial nerves 2-12  intact Cervical spine: full rom with no tenderness, nv intact distally Chest: active breath sounds bilaterally, no wheeze rhonchi or rales Heart: regular rate and rhythm, no murmur Abd: non tender non distended with active bowel sounds Hip is stable with rom  Right shoulder painful and weak rom Nv intact distally No rashes or edema  Assessment/Plan Assessment: right shoulder cuff arthropathy  Plan:  Patient will undergo a right reverse total shoulder by Dr. Kay at [facility] Risks benefits and expectations were discussed with the patient. Patient understand risks, benefits and expectations and wishes to proceed. Preoperative templating of the joint replacement has been completed, documented, and submitted to the Operating Room personnel in order to optimize intra-operative equipment management.   Arvella Fireman PA-C, [facility] is now [employer] [address], [address], [place], [place] [address] Phone: [phone] [email or web address] Facebook  [employer]

## 2024-05-25 NOTE — Progress Notes (Addendum)
 COVID Vaccine received:  []  No [x]  Yes Date of any COVID positive Test in last 90 days: no PCP - Dr. Norleen Hurst Cardiologist - n/a  Chest x-ray -  EKG -   Stress Test - 01/23/11 Epic ECHO -  Cardiac Cath -   Bowel Prep - [x]  No  []   Yes ______  Pacemaker / ICD device [x]  No []  Yes   Spinal Cord Stimulator:[x]  No []  Yes       History of Sleep Apnea? [x]  No []  Yes   CPAP used?- [x]  No []  Yes    Does the patient monitor blood sugar?          []  No [x]  Yes  []  N/A  Patient has: []  NO Hx DM   []  Pre-DM                 []  DM1  [x]   DM2 Does patient have a [employer] or Dexacom? []  No []  Yes   Fasting Blood Sugar Ranges- 90's Checks Blood Sugar ___2__ times a day  GLP1 agonist / usual dose - Trulicity last dose 05/27/24 GLP1 instructions:  SGLT-2 inhibitors / usual dose - no SGLT-2 instructions:   Blood Thinner / Instructions:no Aspirin  Instructions:ASA 81 mg last dose to be 05/29/24  Comments:   Activity level: Patient is able to climb a flight of stairs without difficulty; [x]  No CP  [x]  No SOB,  Patient can perform ADLs without assistance.   Anesthesia review:   Patient denies shortness of breath, fever, cough and chest pain at PAT appointment.  Patient verbalized understanding and agreement to the Pre-Surgical Instructions that were given to them at this PAT appointment. Patient was also educated of the need to review these PAT instructions again prior to his/her surgery.I reviewed the appropriate phone numbers to call if they have any and questions or concerns.

## 2024-05-25 NOTE — Patient Instructions (Signed)
 SURGICAL WAITING ROOM VISITATION  Patients having surgery or a procedure may have no more than 2 support people in the waiting area - these visitors may rotate.    Children under the age of 76 must have an adult with them who is not the patient.  Visitors with respiratory illnesses are discouraged from visiting and should remain at home.  If the patient needs to stay at the hospital during part of their recovery, the visitor guidelines for inpatient rooms apply. Pre-op nurse will coordinate an appropriate time for 1 support person to accompany patient in pre-op.  This support person may not rotate.    Please refer to the [facility] website for the visitor guidelines for Inpatients (after your surgery is over and you are in a regular room).       Your procedure is scheduled on: 06/05/24   Report to [facility] Main Entrance    Report to admitting at 11 AM   Call this number if you have problems the morning of surgery [phone]   Do not eat food :After Midnight.   After Midnight you may have the following liquids until 10:30 AM DAY OF SURGERY  Water  Non-Citrus Juices (without pulp, NO RED-Apple, White grape, White cranberry) Black Coffee (NO MILK/CREAM OR CREAMERS, sugar ok)  Clear Tea (NO MILK/CREAM OR CREAMERS, sugar ok) regular and decaf                             Plain Jell-O (NO RED)                                           Fruit ices (not with fruit pulp, NO RED)                                     Popsicles (NO RED)                                                               Sports drinks like Gatorade (NO RED)                The day of surgery:  Drink ONE (1) Pre-Surgery G2 at 10:30 AM the morning of surgery. Drink in one sitting. Do not sip.  This drink was given to you during your hospital  pre-op appointment visit. Nothing else to drink after completing the  Pre-Surgery  G2.    Oral Hygiene is also important to reduce your risk of infection.                                     Remember - BRUSH YOUR TEETH THE MORNING OF SURGERY WITH YOUR REGULAR TOOTHPASTE  DENTURES WILL BE REMOVED PRIOR TO SURGERY PLEASE DO NOT APPLY Poly grip OR ADHESIVES!!!   Stop all vitamins and herbal supplements 7 days before surgery.   Take these medicines the morning of surgery with A SIP OF WATER : amlodipine, levothyroxine , omeprazole, rosuvastatin, eye drops, tylenol  if  needed.               Do not take Olmesartan the morning of surgery.  DO NOT TAKE ANY ORAL DIABETIC MEDICATIONS DAY OF YOUR SURGERY Hold Jardiance for 72 hours prior to surgery. Last dose to be 06/01/24  Bring CPAP mask and tubing day of surgery.                              You may not have any metal on your body including hair pins, jewelry, and body piercing             Do not wear make-up, lotions, powders, perfumes/cologne, or deodorant  Do not wear nail polish including gel and S&S, artificial/acrylic nails, or any other type of covering on natural nails including finger and toenails. If you have artificial nails, gel coating, etc. that needs to be removed by a nail salon please have this removed prior to surgery or surgery may need to be canceled/ delayed if the surgeon/ anesthesia feels like they are unable to be safely monitored.   Do not shave  48 hours prior to surgery.    Do not bring valuables to the hospital. [facility] IS NOT             RESPONSIBLE   FOR VALUABLES.   Contacts, glasses, dentures or bridgework may not be worn into surgery.  DO NOT BRING YOUR HOME MEDICATIONS TO THE HOSPITAL. PHARMACY WILL DISPENSE MEDICATIONS LISTED ON YOUR MEDICATION LIST TO YOU DURING YOUR ADMISSION IN THE HOSPITAL!    Patients discharged on the day of surgery will not be allowed to drive home.  Someone NEEDS to stay with you for the first 24 hours after anesthesia.   Special Instructions: Bring a copy of your healthcare power of attorney and living will documents the day of surgery if  you haven't scanned them before.              Please read over the following fact sheets you were given: IF YOU HAVE QUESTIONS ABOUT YOUR PRE-OP INSTRUCTIONS PLEASE CALL [phone] [place]   If you received a COVID test during your pre-op visit  it is requested that you wear a mask when out in public, stay away from anyone that may not be feeling well and notify your surgeon if you develop symptoms. If you test positive for Covid or have been in contact with anyone that has tested positive in the last 10 days please notify you surgeon.      Pre-operative 5 CHG Bath Instructions   You can play a key role in reducing the risk of infection after surgery. Your skin needs to be as free of germs as possible. You can reduce the number of germs on your skin by washing with CHG (chlorhexidine  gluconate) soap before surgery. CHG is an antiseptic soap that kills germs and continues to kill germs even after washing.   DO NOT use if you have an allergy to chlorhexidine /CHG or antibacterial soaps. If your skin becomes reddened or irritated, stop using the CHG and notify one of our RNs at [phone].   Please shower with the CHG soap starting 4 days before surgery using the following schedule:     Please keep in mind the following:  DO NOT shave, including legs and underarms, starting the day of your first shower.   You may shave your face at any point before/day of surgery.  Place clean sheets on your bed the day you start using CHG soap. Use a clean washcloth (not used since being washed) for each shower. DO NOT sleep with pets once you start using the CHG.   CHG Shower Instructions:  If you choose to wash your hair and private area, wash first with your normal shampoo/soap.  After you use shampoo/soap, rinse your hair and body thoroughly to remove shampoo/soap residue.  Turn the water  OFF and apply about 3 tablespoons (45 ml) of CHG soap to a CLEAN washcloth.  Apply CHG soap ONLY FROM YOUR NECK DOWN  TO YOUR TOES (washing for 3-5 minutes)  DO NOT use CHG soap on face, private areas, open wounds, or sores.  Pay special attention to the area where your surgery is being performed.  If you are having back surgery, having someone wash your back for you may be helpful. Wait 2 minutes after CHG soap is applied, then you may rinse off the CHG soap.  Pat dry with a clean towel  Put on clean clothes/pajamas   If you choose to wear lotion, please use ONLY the CHG-compatible lotions on the back of this paper.     Additional instructions for the day of surgery: DO NOT APPLY any lotions, deodorants, cologne, or perfumes.   Put on clean/comfortable clothes.  Brush your teeth.  Ask your nurse before applying any prescription medications to the skin.      CHG Compatible Lotions   Aveeno Moisturizing lotion  Cetaphil Moisturizing Cream  Cetaphil Moisturizing Lotion  Clairol Herbal Essence Moisturizing Lotion, Dry Skin  Clairol Herbal Essence Moisturizing Lotion, Extra Dry Skin  Clairol Herbal Essence Moisturizing Lotion, Normal Skin  Curel Age Defying Therapeutic Moisturizing Lotion with Alpha Hydroxy  Curel Extreme Care Body Lotion  Curel Soothing Hands Moisturizing Hand Lotion  Curel Therapeutic Moisturizing Cream, Fragrance-Free  Curel Therapeutic Moisturizing Lotion, Fragrance-Free  Curel Therapeutic Moisturizing Lotion, Original Formula  Eucerin Daily Replenishing Lotion  Eucerin Dry Skin Therapy Plus Alpha Hydroxy Crme  Eucerin Dry Skin Therapy Plus Alpha Hydroxy Lotion  Eucerin Original Crme  Eucerin Original Lotion  Eucerin Plus Crme Eucerin Plus Lotion  Eucerin TriLipid Replenishing Lotion  Keri Anti-Bacterial Hand Lotion  Keri Deep Conditioning Original Lotion Dry Skin Formula Softly Scented  Keri Deep Conditioning Original Lotion, Fragrance Free Sensitive Skin Formula  Keri Lotion Fast Absorbing Fragrance Free Sensitive Skin Formula  Keri Lotion Fast Absorbing Softly  Scented Dry Skin Formula  Keri Original Lotion  Keri Skin Renewal Lotion Keri Silky Smooth Lotion  Keri Silky Smooth Sensitive Skin Lotion  Nivea Body Creamy Conditioning Oil  Nivea Body Extra Enriched Lotion  Nivea Body Original Lotion  Nivea Body Sheer Moisturizing Lotion Nivea Crme  Nivea Skin Firming Lotion  NutraDerm 30 Skin Lotion  NutraDerm Skin Lotion  NutraDerm Therapeutic Skin Cream  NutraDerm Therapeutic Skin Lotion  ProShield Protective Hand Cream    Incentive Spirometer  An incentive spirometer is a tool that can help keep your lungs clear and active. This tool measures how well you are filling your lungs with each breath. Taking long deep breaths may help reverse or decrease the chance of developing breathing (pulmonary) problems (especially infection) following: A long period of time when you are unable to move or be active. BEFORE THE PROCEDURE  If the spirometer includes an indicator to show your best effort, your nurse or respiratory therapist will set it to a desired goal. If possible, sit up straight or lean slightly forward. Try  not to slouch. Hold the incentive spirometer in an upright position. INSTRUCTIONS FOR USE  Sit on the edge of your bed if possible, or sit up as far as you can in bed or on a chair. Hold the incentive spirometer in an upright position. Breathe out normally. Place the mouthpiece in your mouth and seal your lips tightly around it. Breathe in slowly and as deeply as possible, raising the piston or the ball toward the top of the column. Hold your breath for 3-5 seconds or for as long as possible. Allow the piston or ball to fall to the bottom of the column. Remove the mouthpiece from your mouth and breathe out normally. Rest for a few seconds and repeat Steps 1 through 7 at least 10 times every 1-2 hours when you are awake. Take your time and take a few normal breaths between deep breaths. The spirometer may include an indicator to show your  best effort. Use the indicator as a goal to work toward during each repetition. After each set of 10 deep breaths, practice coughing to be sure your lungs are clear. If you have an incision (the cut made at the time of surgery), support your incision when coughing by placing a pillow or rolled up towels firmly against it. Once you are able to get out of bed, walk around indoors and cough well. You may stop using the incentive spirometer when instructed by your caregiver.  RISKS AND COMPLICATIONS Take your time so you do not get dizzy or light-headed. If you are in pain, you may need to take or ask for pain medication before doing incentive spirometry. It is harder to take a deep breath if you are having pain. AFTER USE Rest and breathe slowly and easily. It can be helpful to keep track of a log of your progress. Your caregiver can provide you with a simple table to help with this. If you are using the spirometer at home, follow these instructions: SEEK MEDICAL CARE IF:  You are having difficultly using the spirometer. You have trouble using the spirometer as often as instructed. Your pain medication is not giving enough relief while using the spirometer. You develop fever of 100.5 F (38.1 C) or higher. SEEK IMMEDIATE MEDICAL CARE IF:  You cough up bloody sputum that had not been present before. You develop fever of 102 F (38.9 C) or greater. You develop worsening pain at or near the incision site. MAKE SURE YOU:  Understand these instructions. Will watch your condition. Will get help right away if you are not doing well or get worse. Document Released: 01/28/2007 Document Revised: 12/10/2011 Document Reviewed: 03/31/2007  .How to Manage Your Diabetes Before and After Surgery  Why is it important to control my blood sugar before and after surgery? Improving blood sugar levels before and after surgery helps healing and can limit problems. A way of improving blood sugar control is  eating a healthy diet by:  Eating less sugar and carbohydrates  Increasing activity/exercise  Talking with your doctor about reaching your blood sugar goals High blood sugars (greater than 180 mg/dL) can raise your risk of infections and slow your recovery, so you will need to focus on controlling your diabetes during the weeks before surgery. Make sure that the doctor who takes care of your diabetes knows about your planned surgery including the date and location.  How do I manage my blood sugar before surgery? Check your blood sugar at least 4 times a day, starting  2 days before surgery, to make sure that the level is not too high or low. Check your blood sugar the morning of your surgery when you wake up and every 2 hours until you get to the Short Stay unit. If your blood sugar is less than 70 mg/dL, you will need to treat for low blood sugar: Do not take insulin . Treat a low blood sugar (less than 70 mg/dL) with  cup of clear juice (cranberry or apple), 4 glucose tablets, OR glucose gel. Recheck blood sugar in 15 minutes after treatment (to make sure it is greater than 70 mg/dL). If your blood sugar is not greater than 70 mg/dL on recheck, call [phone] for further instructions. Report your blood sugar to the short stay nurse when you get to Short Stay.  If you are admitted to the hospital after surgery: Your blood sugar will be checked by the staff and you will probably be given insulin  after surgery (instead of oral diabetes medicines) to make sure you have good blood sugar levels. The goal for blood sugar control after surgery is 80-180 mg/dL.   WHAT DO I DO ABOUT MY DIABETES MEDICATION?  Do not take oral diabetes medicines (pills) the morning of surgery. Hold Jardiance for 72 hours prior to surgery. Last dose to be 06/01/24  DO NOT TAKE THE FOLLOWING 7 DAYS PRIOR TO SURGERY: Ozempic, Wegovy, Rybelsus (Semaglutide), Byetta (exenatide), Bydureon (exenatide ER), Victoza, Saxenda  (liraglutide), or Trulicity (dulaglutide) Mounjaro (Tirzepatide) Adlyxin (Lixisenatide), Polyethylene Glycol Loxenatide.  [facility]- Preparing for Total Shoulder Arthroplasty    Before surgery, you can play an important role. Because skin is not sterile, your skin needs to be as free of germs as possible. You can reduce the number of germs on your skin by using the following products. Benzoyl Peroxide Gel Reduces the number of germs present on the skin Applied twice a day to shoulder area starting two days before surgery    ==================================================================  Please follow these instructions carefully:  BENZOYL PEROXIDE 5% GEL  Please do not use if you have an allergy to benzoyl peroxide.   If your skin becomes reddened/irritated stop using the benzoyl peroxide.  Starting two days before surgery, apply as follows: Apply benzoyl peroxide in the morning and at night. Apply after taking a shower. If you are not taking a shower clean entire shoulder front, back, and side along with the armpit with a clean wet washcloth.  Place a quarter-sized dollop on your shoulder and rub in thoroughly, making sure to cover the front, back, and side of your shoulder, along with the armpit.   2 days before ____ AM   ____ PM              1 day before ____ AM   ____ PM                         Do this twice a day for two days.  (Last application is the night before surgery, AFTER using the CHG soap as described below).  Do NOT apply benzoyl peroxide gel on the day of surgery.

## 2024-05-26 ENCOUNTER — Other Ambulatory Visit: Payer: Self-pay

## 2024-05-26 ENCOUNTER — Encounter (HOSPITAL_COMMUNITY)
Admission: RE | Admit: 2024-05-26 | Discharge: 2024-05-26 | Disposition: A | Discharge status: Home / Self Care | Source: Ambulatory Visit | Attending: Orthopedic Surgery

## 2024-05-26 ENCOUNTER — Encounter (HOSPITAL_COMMUNITY): Payer: Self-pay

## 2024-05-26 VITALS — BP 141/85 | HR 75 | Temp 98.1°F | Resp 16 | Ht 64.0 in | Wt 157.0 lb

## 2024-05-26 DIAGNOSIS — Z01810 Encounter for preprocedural cardiovascular examination: Secondary | ICD-10-CM | POA: Diagnosis present

## 2024-05-26 DIAGNOSIS — R9431 Abnormal electrocardiogram [ECG] [EKG]: Secondary | ICD-10-CM | POA: Insufficient documentation

## 2024-05-26 DIAGNOSIS — E119 Type 2 diabetes mellitus without complications: Secondary | ICD-10-CM | POA: Insufficient documentation

## 2024-05-26 DIAGNOSIS — Z01812 Encounter for preprocedural laboratory examination: Secondary | ICD-10-CM | POA: Diagnosis present

## 2024-05-26 DIAGNOSIS — Z01818 Encounter for other preprocedural examination: Secondary | ICD-10-CM | POA: Insufficient documentation

## 2024-05-26 DIAGNOSIS — I10 Essential (primary) hypertension: Secondary | ICD-10-CM | POA: Insufficient documentation

## 2024-05-26 HISTORY — DX: Chronic kidney disease, unspecified: N18.9

## 2024-05-26 HISTORY — DX: Personal history of other diseases of the digestive system: Z87.19

## 2024-05-26 LAB — BASIC METABOLIC PANEL WITH GFR
Anion gap: 11 (ref 5–15)
BUN: 24 mg/dL — ABNORMAL HIGH (ref 8–23)
CO2: 23 mmol/L (ref 22–32)
Calcium: 9.1 mg/dL (ref 8.9–10.3)
Chloride: 105 mmol/L (ref 98–111)
Creatinine, Ser: 1.41 mg/dL — ABNORMAL HIGH (ref 0.44–1.00)
GFR, Estimated: 38 mL/min — ABNORMAL LOW (ref >60)
Glucose, Bld: 106 mg/dL — ABNORMAL HIGH (ref 70–99)
Potassium: 4.2 mmol/L (ref 3.5–5.1)
Sodium: 139 mmol/L (ref 135–145)

## 2024-05-26 LAB — CBC
HCT: 33.2 % — ABNORMAL LOW (ref 36.0–46.0)
Hemoglobin: 10.3 g/dL — ABNORMAL LOW (ref 12.0–15.0)
MCH: 28.5 pg (ref 26.0–34.0)
MCHC: 31 g/dL (ref 30.0–36.0)
MCV: 91.7 fL (ref 80.0–100.0)
Platelets: 205 K/uL (ref 150–400)
RBC: 3.62 MIL/uL — ABNORMAL LOW (ref 3.87–5.11)
RDW: 13 % (ref 11.5–15.5)
WBC: 3.2 K/uL — ABNORMAL LOW (ref 4.0–10.5)
nRBC: 0 % (ref 0.0–0.2)

## 2024-05-26 LAB — SURGICAL PCR SCREEN
MRSA, PCR: NEGATIVE
Staphylococcus aureus: NEGATIVE

## 2024-05-26 LAB — GLUCOSE, CAPILLARY: Glucose-Capillary: 104 mg/dL — ABNORMAL HIGH (ref 70–99)

## 2024-05-27 LAB — HEMOGLOBIN A1C
Hgb A1c MFr Bld: 4.8 % (ref 4.8–5.6)
Mean Plasma Glucose: 91.06 mg/dL

## 2024-06-04 NOTE — Anesthesia Preprocedure Evaluation (Signed)
 Anesthesia Evaluation  Patient identified by MRN, date of birth, ID band Patient awake    Reviewed: Allergy & Precautions, NPO status , Patient's Chart, lab work & pertinent test results  History of Anesthesia Complications (+) PONV and history of anesthetic complications  Airway Mallampati: III  TM Distance: >3 FB Neck ROM: Full    Dental no notable dental hx. (+) Upper Dentures, Dental Advisory Given   Pulmonary shortness of breath   Pulmonary exam normal breath sounds clear to auscultation       Cardiovascular hypertension, Pt. on medications (-) angina (-) Past MI Normal cardiovascular exam Rhythm:Regular Rate:Normal     Neuro/Psych  Headaches    GI/Hepatic hiatal hernia,GERD  Medicated and Controlled,,  Endo/Other  diabetes, Type 2Hypothyroidism    Renal/GU Renal InsufficiencyRenal diseaseLab Results      Component                Value               Date                         K                        4.2                 05/26/2024                CO2                      23                  05/26/2024                BUN                      24 (H)              05/26/2024                CREATININE               1.41 (H)            05/26/2024                GFRNONAA                 38 (L)              05/26/2024                             Musculoskeletal  (+) Arthritis , Osteoarthritis,    Abdominal   Peds  Hematology Lab Results      Component                Value               Date                      WBC                      3.2 (L)             05/26/2024                HGB  10.3 (L)            05/26/2024                HCT                      33.2 (L)            05/26/2024                MCV                      91.7                05/26/2024                PLT                      205                 05/26/2024              Anesthesia Other Findings All See list  Breast CA   Reproductive/Obstetrics                              Anesthesia Physical Anesthesia Plan  ASA: 3  Anesthesia Plan: General and Regional   Post-op Pain Management: Precedex and Tylenol  PO (pre-op)*   Induction: Intravenous  PONV Risk Score and Plan: 3 and Treatment may vary due to age or medical condition, Dexamethasone , Propofol  infusion and TIVA  Airway Management Planned: Oral ETT  Additional Equipment: None  Intra-op Plan:   Post-operative Plan: Extubation in OR  Informed Consent: I have reviewed the patients History and Physical, chart, labs and discussed the procedure including the risks, benefits and alternatives for the proposed anesthesia with the patient or authorized representative who has indicated his/her understanding and acceptance.     Dental advisory given  Plan Discussed with: CRNA and Surgeon  Anesthesia Plan Comments: (TIVA GA w R ISB w exparel )         Anesthesia Quick Evaluation

## 2024-06-05 ENCOUNTER — Encounter (HOSPITAL_COMMUNITY): Payer: Self-pay | Admitting: Orthopedic Surgery

## 2024-06-05 ENCOUNTER — Encounter (HOSPITAL_COMMUNITY)
Admission: RE | Disposition: A | Discharge status: Home / Self Care | Payer: Self-pay | Source: Home / Self Care | Attending: Orthopedic Surgery

## 2024-06-05 ENCOUNTER — Other Ambulatory Visit: Payer: Self-pay

## 2024-06-05 ENCOUNTER — Ambulatory Visit (HOSPITAL_COMMUNITY)

## 2024-06-05 ENCOUNTER — Ambulatory Visit (HOSPITAL_COMMUNITY): Payer: Self-pay | Admitting: Medical

## 2024-06-05 ENCOUNTER — Ambulatory Visit (HOSPITAL_COMMUNITY): Admitting: Anesthesiology

## 2024-06-05 ENCOUNTER — Ambulatory Visit (HOSPITAL_COMMUNITY)
Admission: RE | Admit: 2024-06-05 | Discharge: 2024-06-05 | Disposition: A | Discharge status: Home / Self Care | Attending: Orthopedic Surgery | Admitting: Orthopedic Surgery

## 2024-06-05 DIAGNOSIS — E785 Hyperlipidemia, unspecified: Secondary | ICD-10-CM | POA: Diagnosis not present

## 2024-06-05 DIAGNOSIS — M75101 Unspecified rotator cuff tear or rupture of right shoulder, not specified as traumatic: Secondary | ICD-10-CM | POA: Diagnosis not present

## 2024-06-05 DIAGNOSIS — N184 Chronic kidney disease, stage 4 (severe): Secondary | ICD-10-CM

## 2024-06-05 DIAGNOSIS — M129 Arthropathy, unspecified: Secondary | ICD-10-CM | POA: Diagnosis not present

## 2024-06-05 DIAGNOSIS — M12811 Other specific arthropathies, not elsewhere classified, right shoulder: Secondary | ICD-10-CM

## 2024-06-05 DIAGNOSIS — Z6826 Body mass index (BMI) 26.0-26.9, adult: Secondary | ICD-10-CM | POA: Insufficient documentation

## 2024-06-05 DIAGNOSIS — M12812 Other specific arthropathies, not elsewhere classified, left shoulder: Secondary | ICD-10-CM | POA: Diagnosis present

## 2024-06-05 DIAGNOSIS — I129 Hypertensive chronic kidney disease with stage 1 through stage 4 chronic kidney disease, or unspecified chronic kidney disease: Secondary | ICD-10-CM | POA: Insufficient documentation

## 2024-06-05 DIAGNOSIS — N189 Chronic kidney disease, unspecified: Secondary | ICD-10-CM | POA: Insufficient documentation

## 2024-06-05 DIAGNOSIS — Z7985 Long-term (current) use of injectable non-insulin antidiabetic drugs: Secondary | ICD-10-CM | POA: Diagnosis not present

## 2024-06-05 DIAGNOSIS — E669 Obesity, unspecified: Secondary | ICD-10-CM | POA: Diagnosis not present

## 2024-06-05 DIAGNOSIS — E039 Hypothyroidism, unspecified: Secondary | ICD-10-CM | POA: Diagnosis not present

## 2024-06-05 DIAGNOSIS — G8918 Other acute postprocedural pain: Secondary | ICD-10-CM | POA: Diagnosis not present

## 2024-06-05 DIAGNOSIS — Z853 Personal history of malignant neoplasm of breast: Secondary | ICD-10-CM | POA: Diagnosis not present

## 2024-06-05 DIAGNOSIS — K219 Gastro-esophageal reflux disease without esophagitis: Secondary | ICD-10-CM | POA: Diagnosis not present

## 2024-06-05 DIAGNOSIS — M19011 Primary osteoarthritis, right shoulder: Secondary | ICD-10-CM | POA: Diagnosis not present

## 2024-06-05 DIAGNOSIS — Z9221 Personal history of antineoplastic chemotherapy: Secondary | ICD-10-CM | POA: Insufficient documentation

## 2024-06-05 DIAGNOSIS — E1122 Type 2 diabetes mellitus with diabetic chronic kidney disease: Secondary | ICD-10-CM

## 2024-06-05 DIAGNOSIS — I10 Essential (primary) hypertension: Secondary | ICD-10-CM | POA: Diagnosis not present

## 2024-06-05 DIAGNOSIS — Z923 Personal history of irradiation: Secondary | ICD-10-CM | POA: Insufficient documentation

## 2024-06-05 DIAGNOSIS — Z96611 Presence of right artificial shoulder joint: Secondary | ICD-10-CM | POA: Diagnosis not present

## 2024-06-05 DIAGNOSIS — Z471 Aftercare following joint replacement surgery: Secondary | ICD-10-CM | POA: Diagnosis not present

## 2024-06-05 HISTORY — PX: REVERSE SHOULDER ARTHROPLASTY: SHX5054

## 2024-06-05 LAB — GLUCOSE, CAPILLARY
Glucose-Capillary: 78 mg/dL (ref 70–99)
Glucose-Capillary: 106 mg/dL — ABNORMAL HIGH (ref 70–99)
Glucose-Capillary: 89 mg/dL (ref 70–99)
Glucose-Capillary: 99 mg/dL (ref 70–99)

## 2024-06-05 SURGERY — ARTHROPLASTY, SHOULDER, TOTAL, REVERSE
Anesthesia: Regional | Site: Shoulder | Laterality: Right

## 2024-06-05 MED ORDER — ONDANSETRON HCL 4 MG/2ML IJ SOLN
INTRAMUSCULAR | Status: AC
  Filled 2024-06-05: qty 2

## 2024-06-05 MED ORDER — FENTANYL CITRATE (PF) 100 MCG/2ML IJ SOLN
INTRAMUSCULAR | Status: AC
  Filled 2024-06-05: qty 2

## 2024-06-05 MED ORDER — ACETAMINOPHEN 10 MG/ML IV SOLN
INTRAVENOUS | Status: AC
  Filled 2024-06-05: qty 100

## 2024-06-05 MED ORDER — ORAL CARE MOUTH RINSE: 15.0000 mL | Freq: Once | OROMUCOSAL | Status: AC

## 2024-06-05 MED ORDER — FENTANYL CITRATE (PF) 100 MCG/2ML IJ SOLN
INTRAMUSCULAR | Status: DC | PRN
  Administered 2024-06-05: 50 ug via INTRAVENOUS

## 2024-06-05 MED ORDER — CHLORHEXIDINE GLUCONATE 0.12 % MT SOLN
15.0000 mL | Freq: Once | OROMUCOSAL | Status: AC
  Administered 2024-06-05: 15 mL via OROMUCOSAL

## 2024-06-05 MED ORDER — VANCOMYCIN HCL 1 G IV SOLR
INTRAVENOUS | Status: DC | PRN
  Administered 2024-06-05: 1000 mg via TOPICAL

## 2024-06-05 MED ORDER — BUPIVACAINE HCL (PF) 0.5 % IJ SOLN
INTRAMUSCULAR | Status: DC | PRN
  Administered 2024-06-05: 15 mL via PERINEURAL

## 2024-06-05 MED ORDER — SUGAMMADEX SODIUM 200 MG/2ML IV SOLN
INTRAVENOUS | Status: DC | PRN
  Administered 2024-06-05: 50 mg via INTRAVENOUS
  Administered 2024-06-05: 150 mg via INTRAVENOUS
  Administered 2024-06-05: 50 mg via INTRAVENOUS

## 2024-06-05 MED ORDER — ROCURONIUM BROMIDE 10 MG/ML (PF) SYRINGE
PREFILLED_SYRINGE | INTRAVENOUS | Status: DC | PRN
  Administered 2024-06-05: 50 mg via INTRAVENOUS

## 2024-06-05 MED ORDER — BUPIVACAINE-EPINEPHRINE (PF) 0.25% -1:200000 IJ SOLN
INTRAMUSCULAR | Status: AC
  Filled 2024-06-05: qty 30

## 2024-06-05 MED ORDER — INSULIN ASPART 100 UNIT/ML IJ SOLN: 0.0000 [IU] | INTRAMUSCULAR | Status: DC | PRN

## 2024-06-05 MED ORDER — FENTANYL CITRATE PF 50 MCG/ML IJ SOSY: 25.0000 ug | PREFILLED_SYRINGE | INTRAMUSCULAR | Status: DC | PRN

## 2024-06-05 MED ORDER — ONDANSETRON HCL 4 MG PO TABS: 4.0000 mg | ORAL_TABLET | Freq: Three times a day (TID) | ORAL | 1 refills | Status: AC | PRN

## 2024-06-05 MED ORDER — PHENYLEPHRINE HCL-NACL 20-0.9 MG/250ML-% IV SOLN
INTRAVENOUS | Status: AC
  Filled 2024-06-05: qty 250

## 2024-06-05 MED ORDER — EPHEDRINE SULFATE-NACL 50-0.9 MG/10ML-% IV SOSY
PREFILLED_SYRINGE | INTRAVENOUS | Status: DC | PRN
  Administered 2024-06-05 (×2): 5 mg via INTRAVENOUS

## 2024-06-05 MED ORDER — VANCOMYCIN HCL 1000 MG IV SOLR
INTRAVENOUS | Status: AC
  Filled 2024-06-05: qty 20

## 2024-06-05 MED ORDER — MIDAZOLAM HCL 2 MG/2ML IJ SOLN: 1.0000 mg | INTRAMUSCULAR | Status: DC | PRN

## 2024-06-05 MED ORDER — DEXAMETHASONE SODIUM PHOSPHATE 10 MG/ML IJ SOLN
INTRAMUSCULAR | Status: AC
  Filled 2024-06-05: qty 1

## 2024-06-05 MED ORDER — LIDOCAINE HCL (PF) 2 % IJ SOLN
INTRAMUSCULAR | Status: DC | PRN
  Administered 2024-06-05: 80 mg via INTRADERMAL

## 2024-06-05 MED ORDER — BUPIVACAINE-EPINEPHRINE (PF) 0.25% -1:200000 IJ SOLN
INTRAMUSCULAR | Status: DC | PRN
  Administered 2024-06-05: 15 mL

## 2024-06-05 MED ORDER — TRANEXAMIC ACID-NACL 1000-0.7 MG/100ML-% IV SOLN
1000.0000 mg | INTRAVENOUS | Status: AC
  Administered 2024-06-05: 1000 mg via INTRAVENOUS
  Filled 2024-06-05: qty 100

## 2024-06-05 MED ORDER — PROPOFOL 500 MG/50ML IV EMUL
INTRAVENOUS | Status: DC | PRN
  Administered 2024-06-05: 130 mg via INTRAVENOUS

## 2024-06-05 MED ORDER — ACETAMINOPHEN 500 MG PO TABS
1000.0000 mg | ORAL_TABLET | Freq: Once | ORAL | Status: AC
  Administered 2024-06-05: 1000 mg via ORAL
  Filled 2024-06-05: qty 2

## 2024-06-05 MED ORDER — CEFAZOLIN SODIUM-DEXTROSE 2-4 GM/100ML-% IV SOLN
2.0000 g | INTRAVENOUS | Status: AC
  Administered 2024-06-05: 2 g via INTRAVENOUS
  Filled 2024-06-05: qty 100

## 2024-06-05 MED ORDER — ONDANSETRON HCL 4 MG/2ML IJ SOLN
INTRAMUSCULAR | Status: DC | PRN
Start: 2024-06-05 — End: 2024-06-05
  Administered 2024-06-05: 4 mg via INTRAVENOUS

## 2024-06-05 MED ORDER — 0.9 % SODIUM CHLORIDE (POUR BTL) OPTIME
TOPICAL | Status: DC | PRN
  Administered 2024-06-05: 1000 mL

## 2024-06-05 MED ORDER — PHENYLEPHRINE HCL-NACL 20-0.9 MG/250ML-% IV SOLN
INTRAVENOUS | Status: DC | PRN
  Administered 2024-06-05: 40 ug/min via INTRAVENOUS

## 2024-06-05 MED ORDER — LIDOCAINE HCL (PF) 2 % IJ SOLN
INTRAMUSCULAR | Status: AC
  Filled 2024-06-05: qty 5

## 2024-06-05 MED ORDER — DEXAMETHASONE SODIUM PHOSPHATE 10 MG/ML IJ SOLN
INTRAMUSCULAR | Status: DC | PRN
  Administered 2024-06-05: 5 mg via INTRAVENOUS

## 2024-06-05 MED ORDER — LACTATED RINGERS IV SOLN: INTRAVENOUS | Status: DC

## 2024-06-05 MED ORDER — FENTANYL CITRATE PF 50 MCG/ML IJ SOSY
50.0000 ug | PREFILLED_SYRINGE | INTRAMUSCULAR | Status: DC | PRN
  Administered 2024-06-05: 50 ug via INTRAVENOUS
  Filled 2024-06-05: qty 2

## 2024-06-05 MED ORDER — CEFAZOLIN SODIUM-DEXTROSE 2-4 GM/100ML-% IV SOLN
INTRAVENOUS | Status: AC
  Filled 2024-06-05: qty 100

## 2024-06-05 MED ORDER — TRAMADOL HCL 50 MG PO TABS: 50.0000 mg | ORAL_TABLET | Freq: Four times a day (QID) | ORAL | 0 refills | Status: DC | PRN

## 2024-06-05 MED ORDER — SUGAMMADEX SODIUM 200 MG/2ML IV SOLN
INTRAVENOUS | Status: AC
  Filled 2024-06-05: qty 2

## 2024-06-05 MED ORDER — TRANEXAMIC ACID-NACL 1000-0.7 MG/100ML-% IV SOLN
INTRAVENOUS | Status: AC
  Filled 2024-06-05: qty 100

## 2024-06-05 MED ORDER — ONDANSETRON HCL 4 MG/2ML IJ SOLN: 4.0000 mg | Freq: Once | INTRAMUSCULAR | Status: DC | PRN

## 2024-06-05 MED ORDER — ROCURONIUM BROMIDE 10 MG/ML (PF) SYRINGE
PREFILLED_SYRINGE | INTRAVENOUS | Status: AC
  Filled 2024-06-05: qty 10

## 2024-06-05 MED ORDER — PROPOFOL 10 MG/ML IV BOLUS
INTRAVENOUS | Status: AC
  Filled 2024-06-05: qty 20

## 2024-06-05 MED ORDER — BUPIVACAINE LIPOSOME 1.3 % IJ SUSP
INTRAMUSCULAR | Status: DC | PRN
Start: 2024-06-05 — End: 2024-06-05
  Administered 2024-06-05: 10 mL via PERINEURAL

## 2024-06-05 SURGICAL SUPPLY — 60 items
BAG COUNTER SPONGE SURGICOUNT (BAG) IMPLANT
BAG ZIPLOCK 12X15 (MISCELLANEOUS) IMPLANT
BASEPLATE SHLD INH CMTLS 24 SM (Shoulder) IMPLANT
BIT DRILL 1.6MX128 (BIT) IMPLANT
BIT DRILL INHANCE 3.5X230 (BIT) IMPLANT
BLADE SAG 18X100X1.27 (BLADE) ×1 IMPLANT
COVER BACK TABLE 60X90IN (DRAPES) ×1 IMPLANT
COVER SURGICAL LIGHT HANDLE (MISCELLANEOUS) ×1 IMPLANT
DRAPE INCISE IOBAN 66X45 STRL (DRAPES) ×1 IMPLANT
DRAPE POUCH INSTRU U-SHP 10X18 (DRAPES) ×1 IMPLANT
DRAPE SHEET LG 3/4 BI-LAMINATE (DRAPES) ×1 IMPLANT
DRAPE SURG ORHT 6 SPLT 77X108 (DRAPES) ×2 IMPLANT
DRAPE TOP 10253 STERILE (DRAPES) ×1 IMPLANT
DRAPE U-SHAPE 47X51 STRL (DRAPES) ×1 IMPLANT
DRSG ADAPTIC 3X8 NADH LF (GAUZE/BANDAGES/DRESSINGS) ×1 IMPLANT
DRSG AQUACEL AG ADV 3.5X10 (GAUZE/BANDAGES/DRESSINGS) ×1 IMPLANT
DURAPREP 26ML APPLICATOR (WOUND CARE) ×1 IMPLANT
ELECT BLADE TIP CTD 4 INCH (ELECTRODE) ×1 IMPLANT
ELECT NDL TIP 2.8 STRL (NEEDLE) ×1 IMPLANT
ELECT NEEDLE TIP 2.8 STRL (NEEDLE) ×1 IMPLANT
ELECT PENCIL ROCKER SW 15FT (MISCELLANEOUS) ×1 IMPLANT
ELECT REM PT RETURN 15FT ADLT (MISCELLANEOUS) ×1 IMPLANT
FACESHIELD WRAPAROUND OR TEAM (MASK) ×1 IMPLANT
GAUZE PAD ABD 8X10 STRL (GAUZE/BANDAGES/DRESSINGS) ×1 IMPLANT
GAUZE SPONGE 4X4 12PLY STRL (GAUZE/BANDAGES/DRESSINGS) ×1 IMPLANT
GLENOSPHERE INH SHLD 36+4 (Shoulder) IMPLANT
GLOVE BIOGEL PI IND STRL 7.5 (GLOVE) ×1 IMPLANT
GLOVE BIOGEL PI IND STRL 8.5 (GLOVE) ×1 IMPLANT
GLOVE ORTHO TXT STRL SZ7.5 (GLOVE) ×1 IMPLANT
GLOVE SURG ORTHO 8.5 STRL (GLOVE) ×1 IMPLANT
GOWN STRL REUS W/ TWL XL LVL3 (GOWN DISPOSABLE) ×2 IMPLANT
INSTRUMENT PIN HUM INHNC 3.5 (ORTHOPEDIC DISPOSABLE SUPPLIES) IMPLANT
KIT BASIN OR (CUSTOM PROCEDURE TRAY) ×1 IMPLANT
KIT TURNOVER KIT A (KITS) ×1 IMPLANT
LINER REV SHLD INH 36+0 (Liner) IMPLANT
MANIFOLD NEPTUNE II (INSTRUMENTS) ×1 IMPLANT
NDL MAYO CATGUT SZ4 TPR NDL (NEEDLE) IMPLANT
NEEDLE MAYO CATGUT SZ4 (NEEDLE) IMPLANT
NS IRRIG 1000ML POUR BTL (IV SOLUTION) ×1 IMPLANT
PACK SHOULDER (CUSTOM PROCEDURE TRAY) ×1 IMPLANT
PIN GUIDE INHANCE 3X100 (PIN) IMPLANT
RESTRAINT HEAD UNIVERSAL NS (MISCELLANEOUS) ×1 IMPLANT
SCREW CTRL SHLD INHANCE 6X25 (Screw) IMPLANT
SCREW SD SHOULDER 20X25 (Screw) IMPLANT
SHELL HUM REV INH SHLD M 36+0 (Shell) IMPLANT
SIZER HUM SHLD INHANCE DISP (ORTHOPEDIC DISPOSABLE SUPPLIES) IMPLANT
SLING ARM FOAM STRAP LRG (SOFTGOODS) IMPLANT
SPIKE FLUID TRANSFER (MISCELLANEOUS) ×1 IMPLANT
STEM HUM INH SHLD STD 36X115 (Stem) IMPLANT
STRIP CLOSURE SKIN 1/2X4 (GAUZE/BANDAGES/DRESSINGS) ×1 IMPLANT
SUT MNCRL AB 4-0 PS2 18 (SUTURE) ×1 IMPLANT
SUT VIC AB 0 CT1 36 (SUTURE) ×1 IMPLANT
SUT VIC AB 0 CT2 27 (SUTURE) ×1 IMPLANT
SUT VIC AB 2-0 CT1 TAPERPNT 27 (SUTURE) ×1 IMPLANT
SUTURE FIBERWR #2 38 T-5 BLUE (SUTURE) ×2 IMPLANT
SUTURE FIBERWR#2 38 REV NDL BL (SUTURE) IMPLANT
TAPE CLOTH SURG 4X10 WHT LF (GAUZE/BANDAGES/DRESSINGS) IMPLANT
TOWEL GREEN STERILE FF (TOWEL DISPOSABLE) ×1 IMPLANT
TOWEL OR 17X26 10 PK STRL BLUE (TOWEL DISPOSABLE) ×1 IMPLANT
YANKAUER SUCT BULB TIP NO VENT (SUCTIONS) ×1 IMPLANT

## 2024-06-05 NOTE — Anesthesia Postprocedure Evaluation (Signed)
 Anesthesia Post Note  Patient: Alexis Singh  Procedure(s) Performed: ARTHROPLASTY, SHOULDER, TOTAL, REVERSE (Right: Shoulder)     Patient location during evaluation: PACU Anesthesia Type: General Level of consciousness: awake and alert Pain management: pain level controlled Vital Signs Assessment: post-procedure vital signs reviewed and stable Respiratory status: spontaneous breathing, nonlabored ventilation, respiratory function stable and patient connected to nasal cannula oxygen Cardiovascular status: blood pressure returned to baseline and stable Postop Assessment: no apparent nausea or vomiting Anesthetic complications: no   No notable events documented.  Last Vitals:  Vitals:   06/05/24 1615 06/05/24 1630  BP: 120/66 116/65  Pulse: 61 64  Resp: 14 14  Temp:    SpO2: 93% 94%    Last Pain:  Vitals:   06/05/24 1730  TempSrc:   PainSc: 0-No pain                 Garnette FORBES Skillern

## 2024-06-05 NOTE — Care Plan (Signed)
 Ortho Bundle Case Management Note  Patient Details  Name: Alexis Singh MRN: [id number] Date of Birth: 1946/05/15  R Rev TSA on 06/05/24  DCP: Home with husband DME: No needs PT:  HEP                   DME Arranged:  N/A DME Agency:  NA  HH Arranged:    HH Agency:     Additional Comments: Please contact me with any questions of if this plan should need to change.  Lyle Pepper, [place] [facility] [phone]   06/05/2024, 11:07 AM

## 2024-06-05 NOTE — Interval H&P Note (Signed)
 History and Physical Interval Note:  06/05/2024 11:18 AM  Alexis Singh  has presented today for surgery, with the diagnosis of Rotator cuff arthropathy of right shoulder.  The various methods of treatment have been discussed with the patient and family. After consideration of risks, benefits and other options for treatment, the patient has consented to  Procedure(s): ARTHROPLASTY, SHOULDER, TOTAL, REVERSE (Right) as a surgical intervention.  The patient's history has been reviewed, patient examined, no change in status, stable for surgery.  I have reviewed the patient's chart and labs.  Questions were answered to the patient's satisfaction.     Elspeth JONELLE Her

## 2024-06-05 NOTE — Brief Op Note (Signed)
 06/05/2024  3:19 PM  PATIENT:  Alexis Singh  78 y.o. female  PRE-OPERATIVE DIAGNOSIS:  Rotator cuff arthropathy of right shoulder  POST-OPERATIVE DIAGNOSIS:  Rotator cuff arthropathy of right shoulder  PROCEDURE:  Procedure(s): ARTHROPLASTY, SHOULDER, TOTAL, REVERSE (Right) DePuy Inhance with NO subscap repair  SURGEON:  Surgeons and Role:    DEWAINE Kay Kemps, MD - Primary  PHYSICIAN ASSISTANT:   ASSISTANTS: Debby KATHEE Fireman, PA-C   ANESTHESIA:   regional and general  EBL:  150 mL   BLOOD ADMINISTERED:none  DRAINS: none   LOCAL MEDICATIONS USED:  MARCAINE      SPECIMEN:  No Specimen  DISPOSITION OF SPECIMEN:  N/A  COUNTS:  YES  TOURNIQUET:  * No tourniquets in log *  DICTATION: .Other Dictation: Dictation Number [id number]  PLAN OF CARE: Discharge to home after PACU  PATIENT DISPOSITION:  PACU - hemodynamically stable.   Delay start of Pharmacological VTE agent (>24hrs) due to surgical blood loss or risk of bleeding: not applicable

## 2024-06-05 NOTE — Anesthesia Procedure Notes (Signed)
 Anesthesia Regional Block: Interscalene brachial plexus block   Pre-Anesthetic Checklist: , timeout performed,  Correct Patient, Correct Site, Correct Laterality,  Correct Procedure, Correct Position, site marked,  Risks and benefits discussed,  Surgical consent,  Pre-op evaluation,  At surgeon's request and post-op pain management  Laterality: Upper and Right  Prep: Maximum Sterile Barrier Precautions used, chloraprep       Needles:  Injection technique: Single-shot  Needle Type: Echogenic Needle     Needle Length: 5cm  Needle Gauge: 21     Additional Needles:   Procedures:,,,, ultrasound used (permanent image in chart),,    Narrative:  Start time: 06/05/2024 12:16 PM End time: 06/05/2024 12:21 PM Injection made incrementally with aspirations every 5 mL.  Performed by: Personally  Anesthesiologist: Jefm Garnette LABOR, MD  Additional Notes: Block assessed prior to procedure. Patient tolerated procedure well.

## 2024-06-05 NOTE — Op Note (Signed)
 NAME: Alexis Singh, Alexis Singh MEDICAL RECORD NO: [id number] ACCOUNT NO: [id number] DATE OF BIRTH: 05/14/1946 FACILITY: [facility] LOCATION: [facility] PHYSICIAN: Elspeth SAUNDERS. Kay, MD  Operative Report   DATE OF PROCEDURE: 06/05/2024  PREOPERATIVE DIAGNOSIS:  Right shoulder end-stage rotator cuff tear arthropathy.  POSTOPERATIVE DIAGNOSIS:  Right shoulder end-stage rotator cuff tear arthropathy.  PROCEDURE PERFORMED:  Right reverse total shoulder arthroplasty using DePuy INHANCE implant with no subscapularis repair.  ATTENDING SURGEON:  Elspeth SAUNDERS. Kay, MD  ASSISTANT:  Debby Crock [place], [place], who was scrubbed during the entire procedure, and necessary for satisfactory completion of surgery.  ANESTHESIA:  General anesthesia was used plus interscalene block.  ESTIMATED BLOOD LOSS:  150 mL.  FLUID REPLACEMENT:  1000 mL of crystalloid.  INSTRUMENT COUNTS:  Correct.  COMPLICATIONS:  There were no complications.  ANTIBIOTICS:  Perioperative antibiotics were given.  INDICATIONS:  The patient is a 78 year old female who presents with a history of worsening right shoulder pain secondary to end-stage osteoarthritis and rotator cuff insufficiency.  The patient has had a failure of conservative management and desires  operative treatment to eliminate pain and restore function.  Informed consent was obtained.  DESCRIPTION OF PROCEDURE:  After an adequate level of anesthesia was achieved, the patient was positioned in the modified beach chair position.  The right shoulder was correctly identified and sterile prep and drape were performed.  A timeout was called  verifying the correct patient and correct site.  We entered the patient's shoulder using a standard deltopectoral incision starting at the coracoid process and extending down to the anterior humerus.  Dissection was carried down through subcutaneous  tissues using Bovie.  The cephalic vein was identified and taken laterally with the deltoid.  The  pectoralis was taken medially and the conjoined tendon was retracted medially.  The biceps had previously ruptured.  We did not have to tenodese it.  We  released the inferior capsule and the remnant of the inferior subscapularis and tagged that for protection of the axillary nerve with #2 FiberWire suture.  After releasing the inferior capsule, we extended the shoulder, delivering the humeral head out of  the wound.  We then used an external resection guide and resected the head at 30 degrees of retroversion at a 135-degree angle.  Once we had that resection done, we took the head to the back table.  We removed excess osteophytes.  We then prepped the  humeral side, selecting the medium stem size.  We went ahead and placed our guide pin bicortically and then reamed down to subchondral bone with the proximal preparation reamer.  We then impacted the blazer guide on top.  Once we had that on there, we  used the punch to punch out the lateral cortex and make room for our stem.  Next, we removed that and selected the real medium INHANCE stem and impacted that down to the appropriate depth.  We had excellent stability of the stem and appropriate version  and inclination.  Next, we went to the glenoid side.  We retracted with our deep retractors and we were able to remove the capsule and the labrum and remaining cartilage.  There were some anteriorly, which we removed.  Once our deep retractors were placed and we had good  visualization, we placed our guide pin bicortically for the Metaglene or baseplate reamer.  We then reamed to subchondral bone for the baseplate.  We then impacted the baseplate in position.  We placed a 25 mm central screw with  fair purchase and then we  placed a 25 mm inferior screw with good purchase and then anterosuperior and posterosuperior screws, which were 20 mm thickness, under power.  Overall, we had good baseplate support and stability with the screws.  We then selected the 36 +4  glenosphere  and placed that onto the baseplate and impacted that.  We then did our central set screw and made sure that that was screwed down tightly.  I then made sure that the glenosphere was attached securely and was squared to the baseplate, which I did with the  finger sweep.  There was no soft tissue caught up in that.  We went back and then trialled with the +0 tray and then the 36 +0 poly and reduced the shoulder.  We had nice stability throughout a full arc of motion and excellent range of motion.  We  removed the trial components.  We irrigated thoroughly.  We then impacted the medium +0 tray and then we also selected the 36 +0 poly and snapped that on.  We used our final impactor and reduced the shoulder.  A nice little pop was felt as it was  reduced.  Appropriate tension of the conjoined tendon and good stability throughout a full arc of motion.  We irrigated thoroughly.  We then placed 1 g of vancomycin  powder into the wound and then closed the deltopectoral interval over the top of that  with 0 Vicryl suture followed by 2-0 Vicryl for subcutaneous closure and 4-0 Monocryl for skin.  Steri-Strips were applied followed by a sterile dressing.  The patient tolerated the surgery well.   PUS D: 06/05/2024 3:25:06 pm T: 06/05/2024 5:18:00 pm  JOB: [id number]/ [id number]

## 2024-06-05 NOTE — Anesthesia Procedure Notes (Signed)
 Procedure Name: Intubation Date/Time: 06/05/2024 1:44 PM  Performed by: Joshua Vernell BROCKS, CRNAPre-anesthesia Checklist: Patient identified, Emergency Drugs available, Suction available and Patient being monitored Patient Re-evaluated:Patient Re-evaluated prior to induction Oxygen Delivery Method: Circle system utilized Preoxygenation: Pre-oxygenation with 100% oxygen Induction Type: IV induction Ventilation: Mask ventilation without difficulty Laryngoscope Size: Glidescope and 3 Grade View: Grade I Tube type: Oral Tube size: 7.0 mm Number of attempts: 1 Airway Equipment and Method: Stylet and Video-laryngoscopy Placement Confirmation: ETT inserted through vocal cords under direct vision, positive ETCO2 and breath sounds checked- equal and bilateral Secured at: 21 cm Tube secured with: Tape Dental Injury: Teeth and Oropharynx as per pre-operative assessment

## 2024-06-05 NOTE — Transfer of Care (Signed)
 Immediate Anesthesia Transfer of Care Note  Patient: Alexis Singh  Procedure(s) Performed: ARTHROPLASTY, SHOULDER, TOTAL, REVERSE (Right: Shoulder)  Patient Location: PACU  Anesthesia Type:General  Level of Consciousness: awake  Airway & Oxygen Therapy: Patient Spontanous Breathing  Post-op Assessment: Report given to RN and Post -op Vital signs reviewed and stable  Post vital signs: Reviewed and stable  Last Vitals:  Vitals Value Taken Time  BP 124/72 06/05/24 15:22  Temp    Pulse 65 06/05/24 15:25  Resp 13 06/05/24 15:25  SpO2 96 % 06/05/24 15:25  Vitals shown include unfiled device data.  Last Pain:  Vitals:   06/05/24 1053  TempSrc:   PainSc: 0-No pain      Patients Stated Pain Goal: 4 (06/05/24 1053)  Complications: No notable events documented.

## 2024-06-05 NOTE — Evaluation (Signed)
 Occupational Therapy Evaluation Patient Details Name: Alexis Singh MRN: [id number] DOB: [birth year] Today's Date: 06/05/2024   History of Present Illness   Alexis Singh is a 78 yr old female who is s/p R reverse total shoulder arthroplasty on 06-05-24, due to end stage rotator cuff tear arthropathy.     Clinical Impressions Pt is s/p shoulder replacement of right dominant upper extremity on 06-05-24. Therapist provided education and instruction to patient and her spouse with regards to ROM/exercise protocol, post-op precautions, upper extremity and sling positioning, donning upper extremity clothing, NWB status, sling wear schedule, bathing while maintaining shoulder precautions, use of ice for pain and edema management, no shoulder ROM, and donning/doffing sling. Patient and spouse verbalized and demonstrated understanding as needed. Patient needed assistance to donn shirt, underwear, pants,  and shoes with instruction on compensatory strategies to perform ADLs. Patient to follow-up with MD for further recommendations regarding rehab of shoulder.       If plan is discharge home, recommend the following:   A little help with walking and/or transfers;Help with stairs or ramp for entrance;Assist for transportation;Assistance with cooking/housework;A little help with bathing/dressing/bathroom     Functional Status Assessment   Patient has had a recent decline in their functional status and demonstrates the ability to make significant improvements in function in a reasonable and predictable amount of time.     Equipment Recommendations   None recommended by OT     Recommendations for Other Services         Precautions/Restrictions   Precautions Precautions: Shoulder Type of Shoulder Precautions: sling to be worn at all times except ADLs/exercise, no shoulder ROM, okay to perform elbow, wrist, and hand ROM, okay to perform gentle ADLs Precaution Booklet Issued: Yes  (comment) Recall of Precautions/Restrictions: Intact Required Braces or Orthoses: Sling Restrictions Weight Bearing Restrictions Per Provider Order: Yes RUE Weight Bearing Per Provider Order: Non weight bearing     Mobility Bed Mobility                    Transfers Overall transfer level: Needs assistance Equipment used: 1 person hand held assist Transfers: Sit to/from Stand Sit to Stand: Contact guard assist                  Balance Overall balance assessment: Mild deficits observed, not formally tested              ADL either performed or assessed with clinical judgement   ADL Overall ADL's : Needs assistance/impaired                 Upper Body Dressing : Maximal assistance;Sitting;Cueing for compensatory techniques   Lower Body Dressing: Moderate assistance;Sit to/from stand;Cueing for compensatory techniques                        Pertinent Vitals/Pain Pain Assessment Pain Assessment: No/denies pain     Extremity/Trunk Assessment Upper Extremity Assessment Upper Extremity Assessment: Right hand dominant           Communication Communication Communication: No apparent difficulties   Cognition Arousal: Alert Behavior During Therapy: WFL for tasks assessed/performed               OT - Cognition Comments: Oriented x4          Following commands: Intact       Cueing  General Comments   Cueing Techniques: Verbal cues         Shoulder  Instructions Shoulder Instructions Donning/doffing shirt without moving shoulder: Maximal assistance Method for sponge bathing under operated UE:  (Pt and her spouse verbalized understanding) Donning/doffing sling/immobilizer: Maximal assistance Correct positioning of sling/immobilizer: Maximal assistance Pendulum exercises (written home exercise program):  (N/A) ROM for elbow, wrist and digits of operated UE:  (Pt and her spouse verbalized understanding) Sling wearing  schedule (on at all times/off for ADL's):  (Pt and her spouse verbalized understanding) Proper positioning of operated UE when showering:  (Pt and her spouse verbalized understanding) Dressing change:  (Pt and her spouse verbalized understanding) Positioning of UE while sleeping:  (Pt and her spouse verbalized understanding)    Home Living Family/patient expects to be discharged to:: Private residence Living Arrangements: Spouse/significant other Available Help at Discharge: Family Type of Home: House Home Access: Stairs to enter Secretary/administrator of Steps: 2 Entrance Stairs-Rails: Right Home Layout: One level     Bathroom Shower/Tub: Walk-in shower         Home Equipment: Shower seat - built in;Rollator (4 wheels);Rolling Walker (2 wheels);Wheelchair - manual;BSC/3in1          Prior Functioning/Environment Prior Level of Function : Independent/Modified Independent             Mobility Comments: Independent with ambulation. ADLs Comments: Independent with ADLs, cooking, and cleaning. She hasn't driven since November.    OT Problem List: Impaired UE functional use;Decreased range of motion;Decreased strength        OT Goals(Current goals can be found in the care plan section)   Acute Rehab OT Goals OT Goal Formulation: All assessment and education complete, DC therapy         AM-PAC OT 6 Clicks Daily Activity     Outcome Measure Help from another person eating meals?: None Help from another person taking care of personal grooming?: A Little Help from another person toileting, which includes using toliet, bedpan, or urinal?: A Little Help from another person bathing (including washing, rinsing, drying)?: A Little Help from another person to put on and taking off regular upper body clothing?: A Lot Help from another person to put on and taking off regular lower body clothing?: A Lot 6 Click Score: 17   End of Session Equipment Utilized During Treatment:  Other (comment) (N/A) Nurse Communication: Other (comment) (shoulder education completed)  Activity Tolerance: Patient tolerated treatment well Patient left: in chair;with call bell/phone within reach;with family/visitor present  OT Visit Diagnosis: Muscle weakness (generalized) (M62.81)                Time: 8359-8295 OT Time Calculation (min): 24 min Charges:  OT General Charges $OT Visit: 1 Visit OT Evaluation $OT Eval Moderate Complexity: 1 Mod OT Treatments $Self Care/Home Management : 8-22 mins    Delanna JINNY Lesches, OTR/L 06/05/2024, 5:36 PM

## 2024-06-05 NOTE — Discharge Instructions (Signed)
 Ice to the shoulder constantly.  Change to the Aquacel bandage on Sunday then ok to get the shoulder wet in the shower. Leave that bandage on for one week, then remove and leave the incision open to air.  Do exercise as instructed several times per day.  DO NOT reach behind your back or push up out of a chair with the operative arm.  Use a sling while you are up and around for comfort, may remove while seated.  Keep pillow propped behind the operative elbow.  Follow up with Dr Kay in two weeks in the office, call [phone] for appt  Please call Dr Kay' cell phone [phone] with any questions or concerns

## 2024-06-09 ENCOUNTER — Encounter (HOSPITAL_COMMUNITY): Payer: Self-pay | Admitting: Orthopedic Surgery

## 2024-06-15 DIAGNOSIS — M25562 Pain in left knee: Secondary | ICD-10-CM | POA: Diagnosis not present

## 2024-06-16 DIAGNOSIS — Z9842 Cataract extraction status, left eye: Secondary | ICD-10-CM | POA: Diagnosis not present

## 2024-06-16 DIAGNOSIS — Z79899 Other long term (current) drug therapy: Secondary | ICD-10-CM | POA: Diagnosis not present

## 2024-06-16 DIAGNOSIS — H2511 Age-related nuclear cataract, right eye: Secondary | ICD-10-CM | POA: Diagnosis not present

## 2024-06-16 DIAGNOSIS — Z961 Presence of intraocular lens: Secondary | ICD-10-CM | POA: Diagnosis not present

## 2024-06-16 DIAGNOSIS — H30033 Focal chorioretinal inflammation, peripheral, bilateral: Secondary | ICD-10-CM | POA: Diagnosis not present

## 2024-06-16 DIAGNOSIS — H3581 Retinal edema: Secondary | ICD-10-CM | POA: Diagnosis not present

## 2024-06-18 DIAGNOSIS — Z4889 Encounter for other specified surgical aftercare: Secondary | ICD-10-CM | POA: Diagnosis not present

## 2024-06-25 DIAGNOSIS — H401123 Primary open-angle glaucoma, left eye, severe stage: Secondary | ICD-10-CM | POA: Diagnosis not present

## 2024-07-21 ENCOUNTER — Other Ambulatory Visit: Payer: Self-pay | Admitting: Nurse Practitioner

## 2024-07-21 DIAGNOSIS — Z1231 Encounter for screening mammogram for malignant neoplasm of breast: Secondary | ICD-10-CM

## 2024-07-21 DIAGNOSIS — E039 Hypothyroidism, unspecified: Secondary | ICD-10-CM | POA: Diagnosis not present

## 2024-07-21 DIAGNOSIS — E559 Vitamin D deficiency, unspecified: Secondary | ICD-10-CM | POA: Diagnosis not present

## 2024-07-21 DIAGNOSIS — E782 Mixed hyperlipidemia: Secondary | ICD-10-CM | POA: Diagnosis not present

## 2024-07-21 DIAGNOSIS — E1122 Type 2 diabetes mellitus with diabetic chronic kidney disease: Secondary | ICD-10-CM | POA: Diagnosis not present

## 2024-07-22 DIAGNOSIS — Z4889 Encounter for other specified surgical aftercare: Secondary | ICD-10-CM | POA: Diagnosis not present

## 2024-07-22 DIAGNOSIS — H401123 Primary open-angle glaucoma, left eye, severe stage: Secondary | ICD-10-CM | POA: Diagnosis not present

## 2024-07-22 NOTE — Progress Notes (Signed)
 Ophthalmology Department Clinical Visit Note     CHIEF COMPLAINT Patient presents for Glaucoma   HISTORY OF PRESENT ILLNESS: Alexis Singh is a 78 y.o. old female who presents to the clinic today for:   HPI     Glaucoma   The patient is here for a return visit.  The patient is currently taking glaucoma medications.  The patient always complies with treatment.  The patient reports they are tolerating drops well.  The patient's associated symptoms include blurred vision.  The patient does not need an eye medication refill.        Comments    Primary open angle glaucoma (POAG) of left eye, severe stage Vision some days is more blurry than other days. Most days seems fine since last visit.        Last edited by Stephane JONELLE Kerns, COA on 07/22/2024  1:58 PM.      Phaco/trab OS 08/20/2023- mg, bb (pre-op 13) POAG OU, severe.   Thin corneas (495 OD, 489 OS)  Humphrey visual field interpretation 10-2 OU 09/27/2022: Good reliability OU OD: Central and infero-temporal island within central degrees; MD -27.2; stable OS: Central and infero-nasal island within central degrees; MD -20.0; stable  Humphrey visual field interpretation 10-2 OU 11/13/2021: Good reliability OU OD: 10-2:   Central and infero-temporal island within central degrees -  MD -29.3 OS: 10-2:   Central and infero-nasal island within central degrees -  MD -20.9   Humphrey visual field interpretation 10-2 OU  12/26/2018: Good reliability OU OD: 10-2:  Central island remaining within central 10 degrees - MD -28.0; maybe worse OS: 10-2:  Superior altitudinal and infero-temporal defects within central 10 degrees - MD -20.0; maybe worse  Humphrey visual field interpretation  10-2 OU  09/26/2017: Good reliability OU OD: Central and temporal islands only within central 10 degrees -  MD -26.6 OS: Superior and central loss within central 10 degrees -  MD -16.2  Humphrey visual field interpretation   07/27/2016: Good reliability OU OD:  Inferior island remaining - VFI 18; MD -24.4; probably worse OS: Superior altitudinal defect, infero-temporal defect - VFI 38; MD -18.7; stable  Humphrey visual field interpretation 09/02/2015: Good reliability OU OD: Superior altitudinal defect and inferior paracentral/arcuated defect - VFI 23; MD-22.3; worse than 2013 OS: Superior altitudinal defect and inferior arcuate defect - VFI 41; MD-18.2; stable  Humphrey visual field interpretation 06/04/2014: Good reliability OU OD: Superior altitudinal defect, Inferior arcuate defect - MD-21.6; stable OS: Superior altitudinal defect, Inferior arcuate defect - MD-18.0; stable   Humphrey visual field interpretation 06/08/2013:   Good reliability OU   OD: Superior altitudinal defect; probably stable compared to prior   OS: Superior altitudinal defect; probably stable compared to prior  Humphrey visual field interpretation 12/17/2011:   Good reliabilility OU   OD: Superior altitudinal defect   OS: Superior altitudinal defect   OCT Macula -  02/03/2024 Reliability:  Good OS:  PED  OCT RNFL -  09/27/2022 Reliability:  Poor QI  13 OD,   16 OS OD:  Diffusely thin on profile view; sup avg 63, inf avg 39 OS:  Thin inferiorly > superiorly on profile view; sup avg 86, inf avg 42  OCT RNFL -  11/13/2021 Reliability:  Good, SS  6/10 OD,   7/10 OS OD:  Loss of double-hump pattern on profile view; sup avg 71, inf avg 59 OS:  Blunted double-hump pattern on profile view; sup avg 74, inf avg 70   CURRENT  MEDICATIONS: Current Outpatient Medications on File Prior to Visit (Ophthalmic Drugs)  Medication Sig  . brimonidine (ALPHAGAN) 0.2 % ophthalmic solution Administer 1 drop into the right eye 2 (two) times a day.   Current Outpatient Medications on File Prior to Visit (Other)  Medication Sig  . adalimumab (Humira,CF, Pen Psor-Uv-Adol HS) 80 mg/0.8 mL-40 mg/0.4 mL pnkt injection Inject 80mg  SQ on Day 1, 40mg  on Day  8, then 40mg  every other week  . adalimumab (Humira,CF, Pen) 40 mg/0.4 mL pnkt injection Inject 0.4 mL (40 mg total) under the skin every 14 (fourteen) days.  SABRA amLODIPine (NORVASC) 5 mg tablet   . aspirin  81 mg EC tablet Take 1 tablet (81 mg total) by mouth daily. HOLD UNTIL SEEN BY DR GENEVA [facility] 11/20  . azaTHIOprine (IMURAN) 50 mg tablet Take 0.5 tablets (25 mg total) by mouth daily.  SABRA azithromycin (ZITHROMAX) 250 mg tablet   . calcium  carbonate/vitamin D3 (CALCIUM  500 + D ORAL) Take 1 tablet by mouth daily.  . cholecalciferol 25 mcg (1,000 unit) cap Take 1,000 Units by mouth Once Daily.  . dulaglutide (Trulicity) 1.5 mg/0.5 mL pnij Inject under the skin. Wednesdays  . empagliflozin (Jardiance) 10 mg tab Take 1 tablet by mouth daily.  . ferrous sulfate  325 mg (65 mg iron) tablet Take  by mouth.  . hydrocortisone  (Cortef ) 20 mg tablet Take 10 mg by mouth Once Daily.  . levothyroxine  (SYNTHROID ) 50 mcg tablet Take 75 mcg by mouth daily.  . NON FORMULARY [place]  . olmesartan (BENICAR) 40 mg tablet Take 40 mg by mouth daily.  SABRA omega 3-dha-epa-fish oil 300 mg (120 mg- 180mg )-1,000 mg cap Take 1 capsule by mouth daily.  SABRA omeprazole (PriLOSEC) 20 mg DR capsule Take 1 capsule by mouth Once Daily.  . rosuvastatin (CRESTOR) 5 mg tablet Take 5 mg by mouth.    Referring physician: No referring provider defined for this encounter.  ALLERGIES Allergies  Allergen Reactions  . Alendronate Sodium GI Intolerance  . Ciprofloxacin Other (See Comments)    Mouth sores  . Codeine GI Intolerance  . Iodinated Contrast Media Other (See Comments)    Caused Kidney issues after a CT scan  . Zolpidem GI Intolerance    PAST MEDICAL HISTORY Past Medical History:  Diagnosis Date  . Anemia   . Arthritis    Left Knee per pt.  . Breast cancer    (CMD)    left- s/p lumpectomy, radiation, and chemotherapy  . Cataract   . Diabetes mellitus    Med Hx: Diabetes mellitus;   SABRA GERD (gastroesophageal  reflux disease)   . Glaucoma, unspecified glaucoma type, unspecified laterality    Med Hx: Glaucoma;   . Hypertension   . 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 Osteoporosis, unspecified osteoporosis type, unspecified pathological fracture presence    Med Hx: Osteoporosis;   . Senile nuclear sclerosis 11/12/2011  . Thyroid  disease    Past Surgical History:  Procedure Laterality Date  . ABDOMINAL HYSTERECTOMY  1990  . BONE BIOPSY     Bone Density procedure August 4th, 2025 (upcoming)  . BREAST LUMPECTOMY Left 1994  . CATARACT EXTRACTION W/ INTRAOCULAR LENS IMPLANT Left 08/20/2023   EXTRACTION CATARACT WITH INTRAOCULAR LENS INSERTION performed by Donnice Sickles, MD at [facility] OR  . CHOLECYSTECTOMY  1995   Procedure: CHOLECYSTECTOMY  . HYSTERECTOMY      Procedure: HYSTERECTOMY  . ORIF ANKLE FRACTURE Right   . ORIF WRIST FRACTURE Left  Procedure: FRACTURE SURGERY  . OTHER SURGICAL HISTORY  2004   gamma knife  . PITUITARY SURGERY     1995 and 1999  . SHOULDER SURGERY     September 5th, 2025 (upcoming)  . TRABECULECTOMY Left 08/20/2023   trabeculectomy with mitomycin , combined with phaco by dr caresse performed by Reyes Thresa Bracket, MD at [facility] OR  . TUBAL LIGATION  1989   Procedure: TUBAL LIGATION    FAMILY HISTORY Family History  Problem Relation Name Age of Onset  . Breast cancer Mother    . Hypertension Sister    . Glaucoma Sister    . Stomach cancer Brother    . Diabetes Brother    . Hypertension Brother    . Glaucoma Brother    . Hypertension Brother    . Macular degeneration Neg Hx    . Retinal detachment Neg Hx    . Strabismus Neg Hx    . Amblyopia Neg Hx    . Blindness Neg Hx      SOCIAL HISTORY Social History   Tobacco Use  . Smoking status: Never  . Smokeless tobacco: Never  Vaping Use  . Vaping status: Never Used  Substance Use Topics  . Alcohol use: No  . Drug use: No        OPHTHALMIC EXAM:  Base Eye Exam      Visual Acuity (Snellen - Linear)       Right Left   Dist [place] 20/80 20/80   Dist ph [place] 20/60 -2 20/70 -2         Tonometry (Applanation, 2:22 PM)       Right Left   Pressure 9 14         Neuro/Psych     Oriented x3: Yes   Mood/Affect: Normal           Slit Lamp and Fundus Exam     External Exam       Right Left   External Normal Normal         Slit Lamp Exam       Right Left   Lids/Lashes Normal Normal   Conjunctiva/Sclera White and quiet No bleb, clear   Cornea Guttata, inferior PEK Clear   Anterior Chamber Deep and quiet Deep and quiet   Iris Round and reactive PI   Lens 2+ NSC, 2+ cortical cataract PC IOL         Fundus Exam       Right Left   Disc 2+ Pallor, thin rim 270, 2.2 disc, no heme 3+ Pallor, notch at 6, 2.2 disc, no heme, thin vessels   C/D Ratio 0.9 0.9   Vessels  thin   Periphery Nasal choroidal resolved temporal choroidal            IMAGING AND PROCEDURES:  @EYRES @     ASSESSMENT: 1. Primary open angle glaucoma (POAG) of left eye, severe stage       phaco/trab OS 08/2023  Patient is tolerating and taking medication as prescribed. IOP improved OS. See me sometime early in December Recheck in 2 months  PLAN:  Pred x4 OS Dorzolamide -Timolol  x2 OU Brimonidine 0.2% x2 OD Latanoprost  x1 OD Reviewed proper drop instillation technique Return in about 2 months (around 09/21/2024).  New Medications Ordered This Visit  Medications  . dorzolamide -timoloL  (COSOPT ) 22.3-6.8 mg/mL ophthalmic solution    Sig: Administer 1 drop into both eyes 2 (two) times a day.    Dispense:  10 mL  Refill:  11  . latanoprost  (XALATAN ) 0.005 % ophthalmic solution    Sig: Administer 1 drop into both eyes daily.    Dispense:  2.5 mL    Refill:  11   Patient Instructions  DAILY DRUG REMINDER  Wait 10 minutes between drops!!! (Keep eyes closed for 2 MINUTES after each drop)   Drug EYE R = Right L = Left B = Both Breakfast Lunch  Supper Bedtime Time Frame  Prednisolone (Pink/White top)  L X  X  2x/day this week 1x/day next week Then stop   Atropine (Red top)  L X X   1-2 times a day  Dorzolamide -Timolol  *Cosopt * (Navy blue top)  B X  X    Brimonidine *Alphagan P* (Purple top)  R X  X  Take about 12 hours apart     Latanoprost  (Teal green top)  R    X           HOW TO TAKE EYEDROPS 1.   Make sure you understand when, and exactly how often, to take your eyedrop medicine.  Preferably, learn the names of your medicines, or at least be able to identify your eyedrops by the color of the cap of the bottle (e.g., "I take the purple top 2 times a day in the right eye and the yellow top 1 time a day in both eyes."). 2.  Always wash your hands prior to instilling your eyedrops. 3.  If you are putting drops in both eyes, then sit down or lie down to put in your drops.  This is because it is important to close the eye getting drops for 2 minutes immediately after instilling the drop (this markedly improves absorption).  If you are putting the drops in both eyes, then you will be closing both eyes for at least 2 minutes, therefore you should be sitting or lying down.  If you must stand to put in the drops (need to look in mirror), then have a chair next to you so that you can sit just after instilling the drops and close your eyes for 2 minutes.  If you are only instilling an eyedrop in one eye, then you only have to close that eye after the drop, and therefore you don't necessarily have to sit or lie. 4.  If you are taking more than one drop at a time in the same eye, then you must separate the drops by at least 10 minutes. 5.  To put in the drop, hold the bottle in your dominant hand between your forefinger and thumb and invert the bottle over the targeted eye.  Your head should be tilted back and you should roll your eyes upward. 6.  With your other hand, pull the lower eyelid down and try to instill the drop in the "pouch"  made by pulling the lid down, or on the lower part of the eye ball.  Position the bottle close to the eye, but do not touch the eye with the dropper tip.  Squeeze the sides of the bottle and a drop of the medicine will come out. 7.  You will feel the drop when it contacts the eye, and then you should immediately close the eye, or if you are putting the drop in both eyes, move your hands immediately to the other eye, and place the drop in it, and then close both eyes as recommended in #2 above. 8.  While the eye/s are closed, use a tissue to wipe  away any medication that remains on the lid or lashes.  You may do this during the 2 minutes that the eye remains closed, just do not open the eye to wipe it. 9.  Always remember to put the cap back on the bottle and store the bottle away from pets and not in direct sunlight.  The bottle does not have to be refrigerated, but it may be if that is preferred (some people prefer feeling the cooled eyedrop on the eye). 10.  Never stop the drops without conferring with your doctor first, even if you feel that the drops are not helping your eye.  If you believe you are experiencing side-effects from the eyedrops, then discuss that, or any issues or concerns,  with your doctor.      This document serves as a record of services personally performed by J. Thresa Bracket. It was created on their behalf by My Rayleen, a trained medical scribe. The creation of this record is the provider's dictation and/or activities during the visit. I agree the documentation is accurate and complete.  Electronically signed by: JINNY Thresa Bracket, MD 07/25/2024 8:23 AM

## 2024-07-24 DIAGNOSIS — M8588 Other specified disorders of bone density and structure, other site: Secondary | ICD-10-CM | POA: Diagnosis not present

## 2024-07-24 DIAGNOSIS — E893 Postprocedural hypopituitarism: Secondary | ICD-10-CM | POA: Diagnosis not present

## 2024-07-24 DIAGNOSIS — E119 Type 2 diabetes mellitus without complications: Secondary | ICD-10-CM | POA: Diagnosis not present

## 2024-07-24 DIAGNOSIS — Z8781 Personal history of (healed) traumatic fracture: Secondary | ICD-10-CM | POA: Diagnosis not present

## 2024-07-27 DIAGNOSIS — K219 Gastro-esophageal reflux disease without esophagitis: Secondary | ICD-10-CM | POA: Diagnosis not present

## 2024-07-27 DIAGNOSIS — E039 Hypothyroidism, unspecified: Secondary | ICD-10-CM | POA: Diagnosis not present

## 2024-07-27 DIAGNOSIS — B029 Zoster without complications: Secondary | ICD-10-CM | POA: Diagnosis not present

## 2024-07-27 DIAGNOSIS — Z0000 Encounter for general adult medical examination without abnormal findings: Secondary | ICD-10-CM | POA: Diagnosis not present

## 2024-07-27 DIAGNOSIS — H30033 Focal chorioretinal inflammation, peripheral, bilateral: Secondary | ICD-10-CM | POA: Diagnosis not present

## 2024-07-27 DIAGNOSIS — D509 Iron deficiency anemia, unspecified: Secondary | ICD-10-CM | POA: Diagnosis not present

## 2024-07-27 DIAGNOSIS — E1122 Type 2 diabetes mellitus with diabetic chronic kidney disease: Secondary | ICD-10-CM | POA: Diagnosis not present

## 2024-07-27 DIAGNOSIS — I129 Hypertensive chronic kidney disease with stage 1 through stage 4 chronic kidney disease, or unspecified chronic kidney disease: Secondary | ICD-10-CM | POA: Diagnosis not present

## 2024-07-27 DIAGNOSIS — H4010X0 Unspecified open-angle glaucoma, stage unspecified: Secondary | ICD-10-CM | POA: Diagnosis not present

## 2024-07-27 DIAGNOSIS — I700 Atherosclerosis of aorta: Secondary | ICD-10-CM | POA: Diagnosis not present

## 2024-07-27 DIAGNOSIS — E559 Vitamin D deficiency, unspecified: Secondary | ICD-10-CM | POA: Diagnosis not present

## 2024-07-27 DIAGNOSIS — E782 Mixed hyperlipidemia: Secondary | ICD-10-CM | POA: Diagnosis not present

## 2024-07-27 NOTE — Telephone Encounter (Signed)
 Patient wants us  to know she went to her dr this morning and she has shingles and they stopped her lyrica. Patient would like a call back form provider.

## 2024-07-28 ENCOUNTER — Emergency Department (HOSPITAL_COMMUNITY)
Admission: EM | Admit: 2024-07-28 | Discharge: 2024-07-28 | Disposition: A | Discharge status: Home / Self Care | Attending: Emergency Medicine | Admitting: Emergency Medicine

## 2024-07-28 ENCOUNTER — Other Ambulatory Visit: Payer: Self-pay

## 2024-07-28 DIAGNOSIS — N644 Mastodynia: Secondary | ICD-10-CM | POA: Insufficient documentation

## 2024-07-28 DIAGNOSIS — R110 Nausea: Secondary | ICD-10-CM | POA: Insufficient documentation

## 2024-07-28 DIAGNOSIS — M546 Pain in thoracic spine: Secondary | ICD-10-CM | POA: Insufficient documentation

## 2024-07-28 DIAGNOSIS — Z853 Personal history of malignant neoplasm of breast: Secondary | ICD-10-CM | POA: Diagnosis not present

## 2024-07-28 DIAGNOSIS — Z79899 Other long term (current) drug therapy: Secondary | ICD-10-CM | POA: Diagnosis not present

## 2024-07-28 DIAGNOSIS — D72819 Decreased white blood cell count, unspecified: Secondary | ICD-10-CM | POA: Diagnosis not present

## 2024-07-28 DIAGNOSIS — Z794 Long term (current) use of insulin: Secondary | ICD-10-CM | POA: Insufficient documentation

## 2024-07-28 DIAGNOSIS — E119 Type 2 diabetes mellitus without complications: Secondary | ICD-10-CM | POA: Insufficient documentation

## 2024-07-28 DIAGNOSIS — I129 Hypertensive chronic kidney disease with stage 1 through stage 4 chronic kidney disease, or unspecified chronic kidney disease: Secondary | ICD-10-CM | POA: Diagnosis not present

## 2024-07-28 DIAGNOSIS — R531 Weakness: Secondary | ICD-10-CM | POA: Insufficient documentation

## 2024-07-28 DIAGNOSIS — Z7984 Long term (current) use of oral hypoglycemic drugs: Secondary | ICD-10-CM | POA: Diagnosis not present

## 2024-07-28 DIAGNOSIS — E039 Hypothyroidism, unspecified: Secondary | ICD-10-CM | POA: Insufficient documentation

## 2024-07-28 DIAGNOSIS — B029 Zoster without complications: Secondary | ICD-10-CM | POA: Insufficient documentation

## 2024-07-28 DIAGNOSIS — Z7982 Long term (current) use of aspirin: Secondary | ICD-10-CM | POA: Insufficient documentation

## 2024-07-28 DIAGNOSIS — R21 Rash and other nonspecific skin eruption: Secondary | ICD-10-CM | POA: Diagnosis present

## 2024-07-28 DIAGNOSIS — N189 Chronic kidney disease, unspecified: Secondary | ICD-10-CM | POA: Diagnosis not present

## 2024-07-28 LAB — CBC WITH DIFFERENTIAL/PLATELET
Abs Immature Granulocytes: 0.01 K/uL (ref 0.00–0.07)
Basophils Absolute: 0 K/uL (ref 0.0–0.1)
Basophils Relative: 1 %
Eosinophils Absolute: 0.1 K/uL (ref 0.0–0.5)
Eosinophils Relative: 3 %
HCT: 39.2 % (ref 36.0–46.0)
Hemoglobin: 12.4 g/dL (ref 12.0–15.0)
Immature Granulocytes: 0 %
Lymphocytes Relative: 51 %
Lymphs Abs: 1.5 K/uL (ref 0.7–4.0)
MCH: 29 pg (ref 26.0–34.0)
MCHC: 31.6 g/dL (ref 30.0–36.0)
MCV: 91.8 fL (ref 80.0–100.0)
Monocytes Absolute: 0.5 K/uL (ref 0.1–1.0)
Monocytes Relative: 16 %
Neutro Abs: 0.9 K/uL — ABNORMAL LOW (ref 1.7–7.7)
Neutrophils Relative %: 29 %
Platelets: 211 K/uL (ref 150–400)
RBC: 4.27 MIL/uL (ref 3.87–5.11)
RDW: 13.3 % (ref 11.5–15.5)
Smear Review: NORMAL
WBC: 3 K/uL — ABNORMAL LOW (ref 4.0–10.5)
nRBC: 0 % (ref 0.0–0.2)

## 2024-07-28 LAB — COMPREHENSIVE METABOLIC PANEL WITH GFR
ALT: 16 U/L (ref 0–44)
AST: 32 U/L (ref 15–41)
Albumin: 4.4 g/dL (ref 3.5–5.0)
Alkaline Phosphatase: 229 U/L — ABNORMAL HIGH (ref 38–126)
Anion gap: 12 (ref 5–15)
BUN: 33 mg/dL — ABNORMAL HIGH (ref 8–23)
CO2: 26 mmol/L (ref 22–32)
Calcium: 9.6 mg/dL (ref 8.9–10.3)
Chloride: 101 mmol/L (ref 98–111)
Creatinine, Ser: 1.78 mg/dL — ABNORMAL HIGH (ref 0.44–1.00)
GFR, Estimated: 29 mL/min — ABNORMAL LOW (ref >60)
Glucose, Bld: 112 mg/dL — ABNORMAL HIGH (ref 70–99)
Potassium: 4 mmol/L (ref 3.5–5.1)
Sodium: 139 mmol/L (ref 135–145)
Total Bilirubin: 1.3 mg/dL — ABNORMAL HIGH (ref 0.0–1.2)
Total Protein: 7.9 g/dL (ref 6.5–8.1)

## 2024-07-28 MED ORDER — MORPHINE SULFATE (PF) 2 MG/ML IV SOLN
2.0000 mg | Freq: Once | INTRAVENOUS | Status: AC
  Administered 2024-07-28: 2 mg via INTRAVENOUS
  Filled 2024-07-28: qty 1

## 2024-07-28 MED ORDER — HYDROCORTISONE 10 MG PO TABS
5.0000 mg | ORAL_TABLET | Freq: Every day | ORAL | Status: DC
Start: 2024-07-28 — End: 2024-07-29
  Administered 2024-07-28: 5 mg via ORAL
  Filled 2024-07-28: qty 1

## 2024-07-28 MED ORDER — SODIUM CHLORIDE 0.9 % IV BOLUS
1000.0000 mL | Freq: Once | INTRAVENOUS | Status: AC
  Administered 2024-07-28: 1000 mL via INTRAVENOUS

## 2024-07-28 MED ORDER — HYDROCODONE-ACETAMINOPHEN 5-325 MG PO TABS: ORAL_TABLET | ORAL | 0 refills | Status: AC

## 2024-07-28 MED ORDER — ONDANSETRON HCL 4 MG/2ML IJ SOLN
4.0000 mg | Freq: Once | INTRAMUSCULAR | Status: AC
  Administered 2024-07-28: 4 mg via INTRAVENOUS
  Filled 2024-07-28: qty 2

## 2024-07-28 NOTE — ED Provider Notes (Signed)
 [place] EMERGENCY DEPARTMENT AT [facility] Provider Note   CSN: [id number] Arrival date & time: 07/28/24  1342     Patient presents with: dehydrated   Alexis Singh is a 78 y.o. female.   HPI     Alexis Singh is a 78 y.o. female past medical history of hypertension, type 2 diabetes, prior breast cancer, hypothyroidism, CKD who presents to the Emergency Department complaining of generalized malaise, generalized weakness and nausea  She has been feeling poorly for over 1 week.  She was having pain of her right breast and right mid back for over 1 week.  She noticed painful rash developed over the weekend.  She saw her PCP yesterday was prescribed gabapentin and acyclovir.  She states the medications are not helping her symptoms.  She had a leftover prescription of tramadol  at home and she has been taking that medication as well endorses increased nausea after taking the tramadol .  She denies any abdominal pain, shortness of breath, fever, chills, or vomiting.  No diarrhea.  Prior to Admission medications   Medication Sig Start Date End Date Taking? Authorizing Provider  ACCU-CHEK GUIDE test strip TEST BLOOD SUGAR TWICE DAILY for 90 days    [provider]  Accu-Chek Softclix Lancets lancets USE TO TEST BLOOD SUGAR TWICE DAILY for 90 days 04/25/21   [provider]  acetaminophen  (TYLENOL ) 500 MG tablet Take 500-1,000 mg by mouth every 6 (six) hours as needed for mild pain (pain score 1-3) or moderate pain (pain score 4-6). Rapid release    [provider]  amLODipine (NORVASC) 5 MG tablet Take 5 mg by mouth daily.    [provider]  aspirin  EC 81 MG tablet Take 81 mg by mouth daily.    [provider]  atropine 1 % ophthalmic solution Place 1 drop into the left eye 2 (two) times daily. 04/21/24 10/18/24  [provider]  azaTHIOprine (IMURAN) 50 MG tablet Take 50 mg by mouth daily.    [provider]   brimonidine (ALPHAGAN) 0.2 % ophthalmic solution Place 1 drop into the right eye 2 (two) times daily.    [provider]  CALCIUM  CARB-CHOLECALCIFEROL PO Take 1,200 mg by mouth daily.    [provider]  cholecalciferol (VITAMIN D) 1000 UNITS tablet Take 1,000 Units by mouth daily.    [provider]  dorzolamide -timolol  (COSOPT ) 22.3-6.8 MG/ML ophthalmic solution Place 1 drop into the left eye See admin instructions.  Take one drop in left eye once a day and right eye twice a day    [provider]  Dulaglutide (TRULICITY) 1.5 MG/0.5ML SOPN Take 1.5 mg by mouth once a week.    [provider]  hydrocortisone  (CORTEF ) 5 MG tablet Take 5 mg by mouth daily.    [provider]  JARDIANCE 10 MG TABS tablet Take 10 mg by mouth daily. 10/02/22   [provider]  latanoprost  (XALATAN ) 0.005 % ophthalmic solution Place 1 drop into the right eye at bedtime. 08/07/22   [provider]  levothyroxine  (SYNTHROID ) 75 MCG tablet Take 75 mcg by mouth daily before breakfast.    [provider]  Multiple Vitamin (MULTI VITAMIN) TABS Take 2 tablets by mouth daily. Gummy    [provider]  olmesartan (BENICAR) 40 MG tablet Take 40 mg by mouth daily.    [provider]  omeprazole (PRILOSEC) 20 MG capsule Take 40 mg by mouth daily.    [provider]  ondansetron  (ZOFRAN ) 4 MG tablet Take 1 tablet (4 mg total) by mouth every 8 (eight) hours as needed for nausea, vomiting or refractory nausea / vomiting. 06/05/24 06/05/25  Kay Kemps, MD  OVER THE COUNTER MEDICATION Take 900 mg by mouth daily. [employer], Historical, MD  OVER THE COUNTER MEDICATION Take 2 tablets by mouth daily. Emma    [provider]  prednisoLONE acetate (PRED FORTE) 1 % ophthalmic suspension Place 1 drop into the left eye 4 (four) times daily. 09/07/23   [provider]  rosuvastatin (CRESTOR) 5 MG tablet Take 5 mg by  mouth 2 (two) times a week.    [provider]  traMADol  (ULTRAM ) 50 MG tablet Take 1 tablet (50 mg total) by mouth every 6 (six) hours as needed for severe pain (pain score 7-10). 06/05/24 06/05/25  Kay Kemps, MD    Allergies: Alendronate sodium, Ambien [zolpidem tartrate], Iodinated contrast media, Zolpidem, Ciprofloxacin hcl, and Codeine    Review of Systems  Constitutional:  Positive for fatigue. Negative for appetite change, chills and fever.  HENT:  Negative for congestion.   Respiratory:  Negative for shortness of breath.   Cardiovascular:  Positive for chest pain (Right breast pain).  Gastrointestinal:  Positive for nausea. Negative for abdominal pain, diarrhea and vomiting.  Genitourinary:  Negative for decreased urine volume, difficulty urinating, dysuria and flank pain.  Musculoskeletal:  Positive for back pain (Right mid back pain) and myalgias. Negative for neck pain.  Skin:  Positive for rash.  Neurological:  Positive for weakness. Negative for dizziness, syncope, facial asymmetry, numbness and headaches.  Psychiatric/Behavioral:  Negative for confusion.     Updated Vital Signs BP 128/80   Pulse 69   Temp 97.8 F (36.6 C) (Oral)   Resp 18   Ht 5' 4 (1.626 m)   Wt 71.2 kg   SpO2 94%   BMI 26.94 kg/m   Physical Exam Vitals and nursing note reviewed.  Constitutional:      Appearance: Normal appearance. She is not toxic-appearing.  HENT:     Mouth/Throat:     Mouth: Mucous membranes are moist.  Eyes:     Extraocular Movements: Extraocular movements intact.     Conjunctiva/sclera: Conjunctivae normal.     Pupils: Pupils are equal, round, and reactive to light.  Cardiovascular:     Rate and Rhythm: Normal rate and regular rhythm.     Pulses: Normal pulses.  Pulmonary:     Effort: Pulmonary effort is normal.     Breath sounds: Normal breath sounds.  Abdominal:     Palpations: Abdomen is soft.     Tenderness: There is no abdominal tenderness.   Musculoskeletal:     Cervical back: Normal range of motion.     Right lower leg: No edema.     Left lower leg: No edema.  Skin:    General: Skin is warm.     Capillary Refill: Capillary refill takes less than 2 seconds.     Findings: Rash present. Rash is vesicular.         Comments: Vesicular rash underneath right breast that extends around to right mid back.  No pustules or drainage noted.  Neurological:     General: No focal deficit present.     Mental Status: She is alert.     Sensory: No sensory deficit.     Motor: No weakness.     Comments: CN III-XII grossly intact.  Speech  clear.  No facial or extremity weakness.      (all labs ordered are listed, but only abnormal results are displayed) Labs Reviewed  CBC WITH DIFFERENTIAL/PLATELET - Abnormal; Notable for the following components:      Result Value   WBC 3.0 (*)    Neutro Abs 0.9 (*)    All other components within normal limits  COMPREHENSIVE METABOLIC PANEL WITH GFR - Abnormal; Notable for the following components:   Glucose, Bld 112 (*)    BUN 33 (*)    Creatinine, Ser 1.78 (*)    Alkaline Phosphatase 229 (*)    Total Bilirubin 1.3 (*)    GFR, Estimated 29 (*)    All other components within normal limits    EKG: EKG Interpretation Date/Time:  Tuesday July 28 2024 16:01:28 EDT Ventricular Rate:  67 PR Interval:  207 QRS Duration:  100 QT Interval:  464 QTC Calculation: 490 R Axis:   -34  Text Interpretation: Sinus rhythm Left axis deviation Borderline prolonged QT interval No significant change since last tracing Confirmed by Towana Sharper [phone]) on 07/28/2024 4:03:56 PM  Radiology: No results found.   Procedures   Medications Ordered in the ED  sodium chloride  0.9 % bolus 1,000 mL (has no administration in time range)  morphine  (PF) 2 MG/ML injection 2 mg (has no administration in time range)  ondansetron  (ZOFRAN ) injection 4 mg (has no administration in time range)                                     Medical Decision Making Patient here from home past medical history of breast cancer here with malaise and generalized weakness associated nausea.  She has been having right breast and right back pain for over 1 week noticed a painful rash developing over the weekend.  Was seen by her PCP and prescribed gabapentin and acyclovir.  She started medications yesterday without relief.  Has not taken them today.  I suspect patient's symptoms are secondary to zoster.  Will also check her labs address her nausea and pain. Rash appears to be following T6-7 dermatome She also takes immunosuppressant due to history of pituitary tumor, she takes azathioprine and hydrocortisone .  Symptoms may also be from adrenal insufficiency.  Amount and/or Complexity of Data Reviewed Labs: ordered.    Details: Mild leukopenia, similar to August of this year.  Chemistries serum creatinine elevated but near baseline, sodium and potassium levels are reassuring.  Alk phos slightly elevated from baseline no recent for comparison, but similar to May 2025 Discussion of management or test interpretation with external provider(s): On recheck, patient resting comfortably.  Pain medication has improved symptoms.  Her labs are reassuring, her blood pressure is also reassuring and she does not appear to have orthostatic hypotension.  Chemistry is reassuring, no indication of acute adrenal insufficiency.  She was given her regular dose of hydrocortisone  today as she did not take it earlier today.  She will continue the gabapentin and acyclovir as directed.  I have counseled her on importance of taking her hydrocortisone  daily.  She has tolerated oral fluid challenge here and ate crackers and reports feeling better.  Appears appropriate for discharge home, I recommended close outpatient follow-up with her PCP and ER return precautions given   Risk Prescription drug management.        Final diagnoses:  Herpes zoster  without complication  Generalized weakness  ED Discharge Orders     None          Herlinda Milling, PA-C 07/31/24 1412    Melvenia Motto, MD 08/03/24 [phone]

## 2024-07-28 NOTE — Discharge Instructions (Signed)
 [place] diet as tolerated.  Is important that you take your hydrocortisone  daily as directed.  Please follow-up with your primary care provider for recheck or return to the emergency department if you develop any new or worsening symptoms.

## 2024-07-28 NOTE — ED Triage Notes (Signed)
 Pt arrived via POV c/o weakness and nausea that began yesterday following a PCP appointment, where Pt reports she has Shingles. Pt reports it goes across her back and stomach.

## 2024-07-28 NOTE — ED Triage Notes (Signed)
 Pt reports she has not been eating or drinking as much as she should be and feels nauseated. Pt reports she was prescribed gabapentin and acyclovir.

## 2024-07-28 NOTE — ED Notes (Signed)
 During orthostatic vitals pt stated that she felt  nauseas and light headed while standing

## 2024-08-21 ENCOUNTER — Ambulatory Visit

## 2024-09-03 ENCOUNTER — Ambulatory Visit
Admission: RE | Admit: 2024-09-03 | Discharge: 2024-09-03 | Disposition: A | Source: Ambulatory Visit | Attending: Nurse Practitioner

## 2024-09-03 DIAGNOSIS — Z1231 Encounter for screening mammogram for malignant neoplasm of breast: Secondary | ICD-10-CM

## 2024-09-28 ENCOUNTER — Other Ambulatory Visit: Payer: Self-pay | Admitting: Gastroenterology

## 2024-09-28 DIAGNOSIS — Q446 Cystic disease of liver: Secondary | ICD-10-CM

## 2024-09-28 DIAGNOSIS — K862 Cyst of pancreas: Secondary | ICD-10-CM

## 2024-09-29 NOTE — Progress Notes (Signed)
 "   CHIEF COMPLAINT Chief Complaint  Patient presents with   Uveitis      HISTORY OF PRESENT ILLNESS: Alexis Singh is a 78 y.o. female who presents to the clinic today for follow up for peripheral focal chorioretinal inflammation of both eyes. Initially referred by Dr Geneva. Patient is s/p CEIOL and TRABECULECTOMY OS 08/20/23 per Dr Caresse. Patient is currently on Azathioprine 25 mg every day and Humira 40mg  subcutaneous every 2 weeks.   Today the patient reports taking the medication   She denies other concerns at this time.  Denies any fevers, weight loss, night sweats.   Review of Systems:   Constitutional symptoms: negative Eyes:  negative Ear, nose, throat:  negative Cardiovascular:  negative Respiratory:  negative Gastrointestinal:  negative Genitourinary:  negative Skin:  negative Neurological:  negative Musculoskeletal:  negative Psychiatric:  negative Endocrine:  negative Hematological:  negative Allergic:  negative   Selected notes from the MEDICAL RECORD NUMBER Per Dr. Geneva 02/03/24  ASSESSMENT: 1. Primary open angle glaucoma (POAG) of left eye, severe stage  OCT, Macula - OS - Left Eye         phaco/trab OS 08/2023  Patient is tolerating and taking medication as prescribed. IOP higher OS. Will stop atropine and pred and add dorz-tim to left eye. Adjust drops as below OCT mac shows PED? - consult retina ##after this visit, reviewed OCT with Dr Vicky - Looks like CSR.  Monitor for now.  Patient already has appt with Dr Maree for 5/27.## Recheck in 1 month.   PLAN:   Stop Atropine x1 OS Stop Pred x2 OS  Adjust to Dorzolamide -Timolol  x2 OU  Brimonidine 0.2% x2 OD Latanoprost  x1 OD   Return in about 1 month (around 03/05/2024).    CURRENT DROPS: See med list  Referring physician: No referring provider defined for this encounter.  REVIEW OF SYSTEMS: See tech note  MEDICATIONS Current Outpatient Medications  Medication Sig  Dispense Refill   brimonidine (ALPHAGAN) 0.2 % ophthalmic solution Administer 1 drop into the right eye 2 (two) times a day. 20 mL 11   dorzolamide -timoloL  (COSOPT ) 22.3-6.8 mg/mL ophthalmic solution Administer 1 drop into both eyes 2 (two) times a day. 10 mL 11   latanoprost  (XALATAN ) 0.005 % ophthalmic solution Administer 1 drop into the right eye at bedtime. 2.5 mL 11   adalimumab (Humira,CF, Pen Psor-Uv-Adol HS) 80 mg/0.8 mL-40 mg/0.4 mL pnkt injection Inject 80mg  SQ on Day 1, 40mg  on Day 8, then 40mg  every other week 1.6 mL 0   adalimumab (Humira,CF, Pen) 40 mg/0.4 mL pnkt injection Inject 0.4 mL (40 mg total) under the skin every 14 (fourteen) days. 2.4 mL 3   amLODIPine (NORVASC) 5 mg tablet      aspirin  81 mg EC tablet Take 1 tablet (81 mg total) by mouth daily. HOLD UNTIL SEEN BY DR GENEVA [facility] 11/20 90 tablet 0   azaTHIOprine (IMURAN) 50 mg tablet Take 0.5 tablets (25 mg total) by mouth daily. 45 tablet 3   azithromycin (ZITHROMAX) 250 mg tablet      brimonidine (ALPHAGAN) 0.2 % ophthalmic solution Administer 1 drop into the right eye 2 (two) times a day. 20 mL 11   calcium  carbonate/vitamin D3 (CALCIUM  500 + D ORAL) Take 1 tablet by mouth daily.     cholecalciferol 25 mcg (1,000 unit) cap Take 1,000 Units by mouth Once Daily.     dulaglutide (Trulicity) 1.5 mg/0.5 mL pnij Inject under the skin. Wednesdays  empagliflozin (Jardiance) 10 mg tab Take 1 tablet by mouth daily.     ferrous sulfate  325 mg (65 mg iron) tablet Take  by mouth.     hydrocortisone  (Cortef ) 20 mg tablet Take 10 mg by mouth Once Daily.     latanoprost  (XALATAN ) 0.005 % ophthalmic solution Administer 1 drop into the right eye daily. 2.5 mL 11   levothyroxine  (SYNTHROID ) 50 mcg tablet Take 75 mcg by mouth daily.     NON FORMULARY [place]     olmesartan (BENICAR) 40 mg tablet Take 40 mg by mouth daily.     omega 3-dha-epa-fish oil 300 mg (120 mg- 180mg )-1,000 mg cap Take 1 capsule by mouth  daily.     omeprazole (PriLOSEC) 20 mg DR capsule Take 1 capsule by mouth Once Daily.     rosuvastatin (CRESTOR) 5 mg tablet Take 5 mg by mouth.     No current facility-administered medications for this visit.     ALLERGIES Allergies  Allergen Reactions   Alendronate Sodium GI Intolerance   Ciprofloxacin Other (See Comments)    Mouth sores   Codeine GI Intolerance   Iodinated Contrast Media Other (See Comments)    Caused Kidney issues after a CT scan   Zolpidem GI Intolerance    PAST MEDICAL HISTORY Past Surgical History:  Procedure Laterality Date   ABDOMINAL HYSTERECTOMY  1990   BONE BIOPSY     Bone Density procedure August 4th, 2025 (upcoming)   BREAST LUMPECTOMY Left 1994   CATARACT EXTRACTION W/ INTRAOCULAR LENS IMPLANT Left 08/20/2023   EXTRACTION CATARACT WITH INTRAOCULAR LENS INSERTION performed by Donnice Sickles, MD at [facility] OR   CHOLECYSTECTOMY  1995   Procedure: CHOLECYSTECTOMY   HYSTERECTOMY      Procedure: HYSTERECTOMY   ORIF ANKLE FRACTURE Right    ORIF WRIST FRACTURE Left    Procedure: FRACTURE SURGERY   OTHER SURGICAL HISTORY  2004   gamma knife   PITUITARY SURGERY     1995 and 1999   SHOULDER SURGERY     September 5th, 2025 (upcoming)   TRABECULECTOMY Left 08/20/2023   trabeculectomy with mitomycin , combined with phaco by dr sickles performed by Reyes Thresa Bracket, MD at [facility] OR   TUBAL LIGATION  1989   Procedure: TUBAL LIGATION    FAMILY HISTORY Family History  Problem Relation Name Age of Onset   Breast cancer Mother     Hypertension Sister     Glaucoma Sister     Stomach cancer Brother     Diabetes Brother     Hypertension Brother     Glaucoma Brother     Hypertension Brother     Macular degeneration Neg Hx     Retinal detachment Neg Hx     Strabismus Neg Hx     Amblyopia Neg Hx     Blindness Neg Hx       SOCIAL HISTORY Social History   Socioeconomic History   Marital status: Married     Spouse name: Not on file   Number of children: Not on file   Years of education: Not on file   Highest education level: Not on file  Occupational History   Not on file  Tobacco Use   Smoking status: Never   Smokeless tobacco: Never  Vaping Use   Vaping status: Never Used  Substance and Sexual Activity   Alcohol use: No   Drug use: No   Sexual activity: Not on file  Other Topics Concern  Not on file  Social History Narrative   Retired from a education officer, environmental. Lives with her husband at    Social Drivers of Health   Living Situation: Not on file  Food Insecurity: Not on file  Transportation Needs: Not on file  Utilities: Not on file  Safety: Not on file  Alcohol Screening: Not on file  Tobacco Use: Low Risk (09/29/2024)   Patient History    Smoking Tobacco Use: Never    Smokeless Tobacco Use: Never    Passive Exposure: Not on file  Depression: Not on file  Social Connections: Not on file  Financial Resource Strain: Not on file       OPHTHALMIC EXAM: Base Eye Exam     Visual Acuity (Snellen - Linear)       Right Left   Dist [place] 20/70 +1 20/150 -2   Dist ph [place] 20/40 20/100 +2         Tonometry (Tonopen: Tetracaine OU, 3:40 PM)       Right Left   Pressure 11 5         Pupils       APD   Right None   Left None         Visual Fields       Left Right    Full Full         Extraocular Movement       Right Left    Full Full         Neuro/Psych     Oriented x3: Yes   Mood/Affect: Normal         Dilation     Both eyes: 2.5% Phenylephrine , 1.0% Tropicamide @ 3:40 PM           Slit Lamp and Fundus Exam     External Exam       Right Left   External Normal Normal         Slit Lamp Exam       Right Left   Lids/Lashes Normal Normal   Conjunctiva/Sclera White and quiet No bleb, clear   Cornea Guttata, inferior PEK Clear   Anterior Chamber Deep and quiet Deep and quiet   Iris Round and reactive PI   Lens  2+ NSC, 2+ cortical cataract PC IOL   Anterior Vitreous Normal Normal         Fundus Exam       Right Left   Disc 2+ Pallor, thin rim 270, 2.2 disc, no heme 3+ Pallor, notch at 6, 2.2 disc, no heme, thin vessels   C/D Ratio 0.9 0.9   Macula Normal Normal   Vessels Normal Normal   Periphery Normal Normal            LABS Office Visit on 09/29/2024  Component Date Value   Sodium 09/30/2024 140    Potassium 09/30/2024 3.8    Chloride 09/30/2024 108 (H)    CO2 09/30/2024 26    Anion Gap 09/30/2024 6    Glucose, Random 09/30/2024 79    Blood Urea Nitrogen (BUN) 09/30/2024 20    Creatinine 09/30/2024 1.15    eGFR 09/30/2024 49 (L)    Albumin 09/30/2024 4.0    Total Protein 09/30/2024 6.1 (L)    Bilirubin, Total 09/30/2024 1.6 (H)    Alkaline Phosphatase (AL* 09/30/2024 156 (H)    Aspartate Aminotransfera* 09/30/2024 23    Alanine Aminotransferase* 09/30/2024 14    Calcium  09/30/2024 8.4 (L)    BUN/Creatinine  Ratio 09/30/2024       IMAGING   Imaging  Testing report: Optical coherence tomography Date obtained - 09/29/2024  Indication for imaging: retinal edema Technically adequate study. Patient compliance high.   Right Eye:  Central foveal thickness: 255 Findings: NFD, decreased temporal retinal edema Comparison to previous: not applicable   Left Eye:  Central foveal thickness:279 Findings: SFF, IRF Comparison to previous: not applicable   Diagnosis / Impression: see below  Clinical management:  See below   Abbreviations: NFP--Normal foveal profile. Normal OCT. CME--cystoid macular edema. PED--pigment epithelial detachment. SRF--Subretinal fluid.  EZ --ellipsoid zone. ERM Epiretinal membrane.. ORT - outer retinal tubulation. SRHM - subretinal hyper-reflective material     ASSESSMENT/PLAN:   1. Peripheral focal chorioretinal inflammation of both eyes  OCT, Macula - OU - Both Eyes   CBC with Differential   Comprehensive Metabolic Panel    azaTHIOprine (IMURAN) 50 mg tablet   adalimumab (Humira,CF, Pen) 40 mg/0.4 mL pnkt injection    2. Retinal edema      3. Status post cataract extraction and insertion of intraocular lens of left eye      4. Nuclear sclerotic cataract of right eye      5. High risk medication use  CBC with Differential   Comprehensive Metabolic Panel   adalimumab (Humira,CF, Pen) 40 mg/0.4 mL pnkt injection        Peripheral focal chorioretinal inflammation of both eyes  - Initially referred by Dr Geneva - Patient shows signs of active inflammation on ocular exam today 02/25/24 - I have discussed the vision threatening nature/potentially blinding disease which requires chemotherapy/immune suppression. I have discussed in great detail the medication along with reviewing all the systemic implications.  -I have reviewed that systemic immune suppression requires monitoring with high risk labs as there is always the potential for changes in hepatic and hematologic or renal parameters that can be life threatening.  - I have expressed patient has active inflammation on exam today. I have discussed rationale for starting systemic steroid-sparing therapy. I recommend starting Azathioprine. I have discussed side effects and patient consents to proceed on 02/25/24. - Imuran Immunosuppression side effects: Liver failure/ toxicity, neuropathy, mild renal impairment, bone marrow suppression, secondary infections, and lymphoproliferative disorders. - I will draw high risk labs today 02/25/24 - Started Azathioprine 50 mg 02/25/24 - Patient shows signs of active inflammation on ocular exam today 04/21/24 - I recommend starting Humira, Atropine, and Durezol. I have discussed side effects and patient consents to proceed on 04/21/24.  - .Started approval process for Humira 40 mg SQ q2weeks on 7/22/5 and instructed to start once obtained.  - Start Atropine OS QD, Durezol OS QID 04/21/24 - Patient shows signs of active  inflammation on ocular exam today 06/16/24 - Decreased Azathioprine 25 mg QD given leukopenia.  - She is quiet on Humira 40mg  subcutaneous every 2 weeks and Azathioprine 25mg  daily  09/29/24  - Continue Azathioprine 25 mg QD  - Humira 40mg  subcutaneous every 2 weeks.   - Follow up in 14 weeks for reevaluation   2. Retinal edema - see above  3. Primary open angle glaucoma (POAG) of left eye, severe stage  - Managed by Dr Geneva - Hold Dorzolamide -Timolol  BID OU per Dr Geneva 04/21/24  Continue Brimonidine BID OD and Latanoprost  daily OD per Dr. Geneva  4. Status post cataract extraction and insertion of intraocular lens of left eye  - Will monitor  5. Nuclear sclerotic cataract of right eye  -  Monitor  6. High risk medication use  - I have assessed no occult side-effects to medication  - High risk labs last drawn on 09/29/24        =====  Paticia Fairly, MD  Diseases and Surgery of the Retina and Vitreous and  Uveitis and Ocular Immunology Assistant Professor of Ophthalmology Department of Ophthalmology  [facility]   Requested Prescriptions   Signed Prescriptions Disp Refills   azaTHIOprine (IMURAN) 50 mg tablet 45 tablet 3    Sig: Take 0.5 tablets (25 mg total) by mouth daily.   adalimumab (Humira,CF, Pen) 40 mg/0.4 mL pnkt injection 2.4 mL 3    Sig: Inject 0.4 mL (40 mg total) under the skin every 14 (fourteen) days.        Return in about 12 weeks (around 12/22/2024).     Abbreviations DME diabetic macular edema; PDR proliferative diabetic retinopathy; NPDR non proliferative diabetic retinopathy;  BRVO  Branch retinal vein occlusion; CRVO central retinal vein occlusion; ; RD retinal detachment; RT retinal tear; SB scleral buckle; PPV pars plana vitrectomy; VH  Vitreous hemorrhage; PRP  panretinal laser photocoagulation; IVK intravitreal kenalog ; IVA intravitreal avastin; IVL intravitreal Lucentis; IVE Intravitreal Eylea; PVD posterior  vitreous detachment; OD right eye; OS left eye; OU  Both eyes;  VMT  vitreomacular traction; MH  Macular hole; ERM epiretinal membrane; NVD neovascularization of the disc; NVE neovascularization elsewhere; AREDS age related eye disease study "

## 2024-10-28 ENCOUNTER — Other Ambulatory Visit

## 2024-10-28 ENCOUNTER — Ambulatory Visit
Admission: RE | Admit: 2024-10-28 | Discharge: 2024-10-28 | Disposition: A | Source: Ambulatory Visit | Attending: Gastroenterology | Admitting: Gastroenterology

## 2024-10-28 DIAGNOSIS — Q446 Cystic disease of liver: Secondary | ICD-10-CM

## 2024-10-28 DIAGNOSIS — K862 Cyst of pancreas: Secondary | ICD-10-CM

## 2024-10-28 MED ORDER — GADOPICLENOL 0.5 MMOL/ML IV SOLN
7.0000 mL | Freq: Once | INTRAVENOUS | Status: AC | PRN
  Administered 2024-10-28: 7 mL via INTRAVENOUS
